# Patient Record
Sex: Female | Born: 1935 | Race: White | Hispanic: No | Smoking: Never smoker
Health system: Southern US, Community
[De-identification: ages and names within clinical notes are randomized; demographics above are authoritative.]

## PROBLEM LIST (undated history)

## (undated) DIAGNOSIS — E039 Hypothyroidism, unspecified: Secondary | ICD-10-CM

## (undated) DIAGNOSIS — E785 Hyperlipidemia, unspecified: Secondary | ICD-10-CM

## (undated) DIAGNOSIS — M1990 Unspecified osteoarthritis, unspecified site: Secondary | ICD-10-CM

## (undated) DIAGNOSIS — I10 Essential (primary) hypertension: Secondary | ICD-10-CM

## (undated) HISTORY — DX: Essential (primary) hypertension: I10

## (undated) HISTORY — DX: Hypothyroidism, unspecified: E03.9

## (undated) HISTORY — PX: TONSILLECTOMY: SHX5217

## (undated) HISTORY — PX: CLOSED REDUCTION SHOULDER DISLOCATION: SUR242

## (undated) HISTORY — DX: Hyperlipidemia, unspecified: E78.5

---
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
## 2000-08-20 ENCOUNTER — Other Ambulatory Visit: Admission: RE | Admit: 2000-08-20 | Discharge: 2000-08-20 | Payer: Self-pay | Admitting: Obstetrics & Gynecology

## 2002-05-07 ENCOUNTER — Encounter: Payer: Self-pay | Admitting: Obstetrics & Gynecology

## 2002-05-07 ENCOUNTER — Ambulatory Visit (HOSPITAL_COMMUNITY): Admission: RE | Admit: 2002-05-07 | Discharge: 2002-05-07 | Payer: Self-pay | Admitting: Obstetrics & Gynecology

## 2002-06-04 ENCOUNTER — Other Ambulatory Visit: Admission: RE | Admit: 2002-06-04 | Discharge: 2002-06-04 | Payer: Self-pay | Admitting: Obstetrics & Gynecology

## 2003-06-04 ENCOUNTER — Encounter: Admission: RE | Admit: 2003-06-04 | Discharge: 2003-06-04 | Payer: Self-pay | Admitting: Family Medicine

## 2003-06-04 ENCOUNTER — Encounter: Payer: Self-pay | Admitting: Family Medicine

## 2003-09-22 ENCOUNTER — Other Ambulatory Visit: Admission: RE | Admit: 2003-09-22 | Discharge: 2003-09-22 | Payer: Self-pay | Admitting: Obstetrics & Gynecology

## 2004-11-19 ENCOUNTER — Ambulatory Visit: Payer: Self-pay | Admitting: Internal Medicine

## 2004-11-19 ENCOUNTER — Inpatient Hospital Stay (HOSPITAL_COMMUNITY): Admission: EM | Admit: 2004-11-19 | Discharge: 2004-11-20 | Payer: Self-pay | Admitting: Emergency Medicine

## 2004-11-21 ENCOUNTER — Ambulatory Visit: Payer: Self-pay | Admitting: Family Medicine

## 2004-12-12 ENCOUNTER — Ambulatory Visit: Payer: Self-pay | Admitting: Family Medicine

## 2005-02-09 ENCOUNTER — Ambulatory Visit: Payer: Self-pay | Admitting: Family Medicine

## 2005-04-11 ENCOUNTER — Ambulatory Visit: Payer: Self-pay | Admitting: Family Medicine

## 2005-04-17 ENCOUNTER — Ambulatory Visit: Payer: Self-pay | Admitting: Family Medicine

## 2005-06-26 ENCOUNTER — Ambulatory Visit: Payer: Self-pay | Admitting: Family Medicine

## 2005-09-26 ENCOUNTER — Other Ambulatory Visit: Admission: RE | Admit: 2005-09-26 | Discharge: 2005-09-26 | Payer: Self-pay | Admitting: Obstetrics & Gynecology

## 2008-01-15 ENCOUNTER — Ambulatory Visit: Payer: Self-pay | Admitting: Family Medicine

## 2008-01-15 DIAGNOSIS — I10 Essential (primary) hypertension: Secondary | ICD-10-CM | POA: Insufficient documentation

## 2008-01-15 DIAGNOSIS — Z9089 Acquired absence of other organs: Secondary | ICD-10-CM | POA: Insufficient documentation

## 2008-01-15 DIAGNOSIS — M810 Age-related osteoporosis without current pathological fracture: Secondary | ICD-10-CM | POA: Insufficient documentation

## 2008-01-15 DIAGNOSIS — M25569 Pain in unspecified knee: Secondary | ICD-10-CM | POA: Insufficient documentation

## 2008-01-21 ENCOUNTER — Telehealth (INDEPENDENT_AMBULATORY_CARE_PROVIDER_SITE_OTHER): Payer: Self-pay | Admitting: *Deleted

## 2008-02-05 ENCOUNTER — Ambulatory Visit: Payer: Self-pay | Admitting: Family Medicine

## 2008-02-06 ENCOUNTER — Encounter (INDEPENDENT_AMBULATORY_CARE_PROVIDER_SITE_OTHER): Payer: Self-pay | Admitting: *Deleted

## 2008-02-19 ENCOUNTER — Ambulatory Visit: Payer: Self-pay | Admitting: Internal Medicine

## 2008-02-19 ENCOUNTER — Encounter: Payer: Self-pay | Admitting: Family Medicine

## 2008-03-03 ENCOUNTER — Ambulatory Visit: Payer: Self-pay | Admitting: Family Medicine

## 2008-03-03 LAB — CONVERTED CEMR LAB
GFR calc Af Amer: 62 mL/min
GFR calc non Af Amer: 51 mL/min
Potassium: 4.1 meq/L (ref 3.5–5.1)
Sodium: 140 meq/L (ref 135–145)

## 2008-03-04 ENCOUNTER — Encounter (INDEPENDENT_AMBULATORY_CARE_PROVIDER_SITE_OTHER): Payer: Self-pay | Admitting: *Deleted

## 2008-03-04 ENCOUNTER — Encounter: Payer: Self-pay | Admitting: Family Medicine

## 2008-06-03 ENCOUNTER — Ambulatory Visit: Payer: Self-pay | Admitting: Family Medicine

## 2008-06-05 LAB — CONVERTED CEMR LAB
AST: 22 units/L (ref 0–37)
Basophils Absolute: 0.1 10*3/uL (ref 0.0–0.1)
Basophils Relative: 0.6 % (ref 0.0–3.0)
Chloride: 106 meq/L (ref 96–112)
Creatinine, Ser: 1.1 mg/dL (ref 0.4–1.2)
Direct LDL: 146.8 mg/dL
Eosinophils Absolute: 0.1 10*3/uL (ref 0.0–0.7)
GFR calc Af Amer: 62 mL/min
GFR calc non Af Amer: 51 mL/min
HDL: 45.4 mg/dL (ref >39.0)
MCHC: 35.4 g/dL (ref 30.0–36.0)
MCV: 90 fL (ref 78.0–100.0)
Neutrophils Relative %: 67.8 % (ref 43.0–77.0)
Platelets: 167 10*3/uL (ref 150–400)
Potassium: 4 meq/L (ref 3.5–5.1)
Total Bilirubin: 1 mg/dL (ref 0.3–1.2)
Triglycerides: 137 mg/dL (ref 0–149)

## 2008-06-07 ENCOUNTER — Encounter (INDEPENDENT_AMBULATORY_CARE_PROVIDER_SITE_OTHER): Payer: Self-pay | Admitting: *Deleted

## 2008-06-30 ENCOUNTER — Telehealth (INDEPENDENT_AMBULATORY_CARE_PROVIDER_SITE_OTHER): Payer: Self-pay | Admitting: *Deleted

## 2008-07-01 ENCOUNTER — Ambulatory Visit: Payer: Self-pay | Admitting: Family Medicine

## 2008-07-01 DIAGNOSIS — R42 Dizziness and giddiness: Secondary | ICD-10-CM | POA: Insufficient documentation

## 2008-07-01 DIAGNOSIS — H6120 Impacted cerumen, unspecified ear: Secondary | ICD-10-CM | POA: Insufficient documentation

## 2008-07-02 ENCOUNTER — Telehealth (INDEPENDENT_AMBULATORY_CARE_PROVIDER_SITE_OTHER): Payer: Self-pay | Admitting: *Deleted

## 2008-07-02 ENCOUNTER — Encounter (INDEPENDENT_AMBULATORY_CARE_PROVIDER_SITE_OTHER): Payer: Self-pay | Admitting: *Deleted

## 2008-07-08 ENCOUNTER — Ambulatory Visit: Payer: Self-pay

## 2008-07-08 ENCOUNTER — Encounter: Payer: Self-pay | Admitting: Family Medicine

## 2008-07-11 DIAGNOSIS — E039 Hypothyroidism, unspecified: Secondary | ICD-10-CM | POA: Insufficient documentation

## 2008-07-12 ENCOUNTER — Telehealth (INDEPENDENT_AMBULATORY_CARE_PROVIDER_SITE_OTHER): Payer: Self-pay | Admitting: *Deleted

## 2008-07-12 ENCOUNTER — Encounter (INDEPENDENT_AMBULATORY_CARE_PROVIDER_SITE_OTHER): Payer: Self-pay | Admitting: *Deleted

## 2008-07-20 ENCOUNTER — Telehealth (INDEPENDENT_AMBULATORY_CARE_PROVIDER_SITE_OTHER): Payer: Self-pay | Admitting: *Deleted

## 2008-07-26 ENCOUNTER — Encounter (INDEPENDENT_AMBULATORY_CARE_PROVIDER_SITE_OTHER): Payer: Self-pay | Admitting: *Deleted

## 2008-09-20 ENCOUNTER — Ambulatory Visit: Payer: Self-pay | Admitting: Family Medicine

## 2008-09-21 ENCOUNTER — Encounter (INDEPENDENT_AMBULATORY_CARE_PROVIDER_SITE_OTHER): Payer: Self-pay | Admitting: *Deleted

## 2008-09-21 LAB — CONVERTED CEMR LAB: Vit D, 1,25-Dihydroxy: 21 — ABNORMAL LOW (ref 30–89)

## 2008-09-23 ENCOUNTER — Encounter: Payer: Self-pay | Admitting: Family Medicine

## 2008-10-08 LAB — CONVERTED CEMR LAB
CO2: 29 meq/L (ref 19–32)
Chloride: 103 meq/L (ref 96–112)
Free T4: 1.2 ng/dL (ref 0.6–1.6)
Glucose, Bld: 94 mg/dL (ref 70–99)
Potassium: 4 meq/L (ref 3.5–5.1)
Sodium: 139 meq/L (ref 135–145)
T3, Free: 2.7 pg/mL (ref 2.3–4.2)
TSH: 1.12 microintl units/mL (ref 0.35–5.50)

## 2008-10-11 ENCOUNTER — Encounter (INDEPENDENT_AMBULATORY_CARE_PROVIDER_SITE_OTHER): Payer: Self-pay | Admitting: *Deleted

## 2008-10-14 ENCOUNTER — Encounter (INDEPENDENT_AMBULATORY_CARE_PROVIDER_SITE_OTHER): Payer: Self-pay | Admitting: *Deleted

## 2008-11-12 HISTORY — PX: CATARACT EXTRACTION W/ INTRAOCULAR LENS  IMPLANT, BILATERAL: SHX1307

## 2009-06-20 ENCOUNTER — Telehealth (INDEPENDENT_AMBULATORY_CARE_PROVIDER_SITE_OTHER): Payer: Self-pay | Admitting: *Deleted

## 2009-09-30 ENCOUNTER — Encounter: Payer: Self-pay | Admitting: Family Medicine

## 2009-11-01 ENCOUNTER — Encounter: Payer: Self-pay | Admitting: Internal Medicine

## 2009-11-01 ENCOUNTER — Ambulatory Visit: Payer: Self-pay | Admitting: Family Medicine

## 2009-11-01 DIAGNOSIS — R319 Hematuria, unspecified: Secondary | ICD-10-CM | POA: Insufficient documentation

## 2009-11-01 DIAGNOSIS — M549 Dorsalgia, unspecified: Secondary | ICD-10-CM | POA: Insufficient documentation

## 2010-10-16 ENCOUNTER — Encounter: Payer: Self-pay | Admitting: Family Medicine

## 2010-10-16 LAB — HM MAMMOGRAPHY: HM Mammogram: NORMAL

## 2010-11-02 ENCOUNTER — Ambulatory Visit: Payer: Self-pay | Admitting: Family Medicine

## 2010-11-02 ENCOUNTER — Encounter: Payer: Self-pay | Admitting: Family Medicine

## 2010-11-02 LAB — CONVERTED CEMR LAB
Bilirubin Urine: NEGATIVE
Ketones, urine, test strip: NEGATIVE
Nitrite: NEGATIVE
Specific Gravity, Urine: 1.01
Urobilinogen, UA: 0.2

## 2010-11-03 ENCOUNTER — Encounter: Payer: Self-pay | Admitting: Family Medicine

## 2010-11-03 LAB — CONVERTED CEMR LAB
Albumin: 3.9 g/dL (ref 3.5–5.2)
Basophils Absolute: 0 10*3/uL (ref 0.0–0.1)
Basophils Relative: 0.5 % (ref 0.0–3.0)
CO2: 28 meq/L (ref 19–32)
Calcium: 9.7 mg/dL (ref 8.4–10.5)
Chloride: 106 meq/L (ref 96–112)
Cholesterol: 224 mg/dL — ABNORMAL HIGH (ref 0–200)
Direct LDL: 152.3 mg/dL
Eosinophils Absolute: 0.1 10*3/uL (ref 0.0–0.7)
Glucose, Bld: 86 mg/dL (ref 70–99)
Hemoglobin: 15.2 g/dL — ABNORMAL HIGH (ref 12.0–15.0)
Lymphocytes Relative: 20.6 % (ref 12.0–46.0)
Lymphs Abs: 1.9 10*3/uL (ref 0.7–4.0)
MCHC: 35 g/dL (ref 30.0–36.0)
MCV: 90.3 fL (ref 78.0–100.0)
Monocytes Absolute: 0.5 10*3/uL (ref 0.1–1.0)
Neutro Abs: 6.6 10*3/uL (ref 1.4–7.7)
RBC: 4.81 M/uL (ref 3.87–5.11)
RDW: 13.3 % (ref 11.5–14.6)
Sodium: 141 meq/L (ref 135–145)
TSH: 11.01 microintl units/mL — ABNORMAL HIGH (ref 0.35–5.50)
Total CHOL/HDL Ratio: 4
Total Protein: 6.8 g/dL (ref 6.0–8.3)
Triglycerides: 88 mg/dL (ref 0.0–149.0)
Vit D, 25-Hydroxy: 34 ng/mL (ref 30–89)

## 2010-11-04 ENCOUNTER — Emergency Department (HOSPITAL_BASED_OUTPATIENT_CLINIC_OR_DEPARTMENT_OTHER)
Admission: EM | Admit: 2010-11-04 | Discharge: 2010-11-04 | Payer: Self-pay | Source: Home / Self Care | Admitting: Emergency Medicine

## 2010-12-01 ENCOUNTER — Other Ambulatory Visit: Payer: Self-pay | Admitting: Family Medicine

## 2010-12-01 ENCOUNTER — Ambulatory Visit
Admission: RE | Admit: 2010-12-01 | Discharge: 2010-12-01 | Payer: Self-pay | Source: Home / Self Care | Attending: Family Medicine | Admitting: Family Medicine

## 2010-12-01 LAB — FECAL OCCULT BLOOD, IMMUNOCHEMICAL: Fecal Occult Bld: NEGATIVE

## 2010-12-10 LAB — CONVERTED CEMR LAB
BUN: 21 mg/dL (ref 6–23)
Basophils Absolute: 0 10*3/uL (ref 0.0–0.1)
Basophils Relative: 0 % (ref 0.0–3.0)
CO2: 27 meq/L (ref 19–32)
Chloride: 106 meq/L (ref 96–112)
Eosinophils Relative: 1.4 % (ref 0.0–5.0)
Glucose, Bld: 94 mg/dL (ref 70–99)
Hemoglobin: 15.4 g/dL — ABNORMAL HIGH (ref 12.0–15.0)
Lymphocytes Relative: 24.7 % (ref 12.0–46.0)
MCHC: 34.4 g/dL (ref 30.0–36.0)
Monocytes Relative: 2.8 % — ABNORMAL LOW (ref 3.0–12.0)
Neutro Abs: 5.9 10*3/uL (ref 1.4–7.7)
Potassium: 3.7 meq/L (ref 3.5–5.1)
RBC: 4.89 M/uL (ref 3.87–5.11)
Sodium: 141 meq/L (ref 135–145)
WBC: 8.3 10*3/uL (ref 4.5–10.5)

## 2010-12-14 NOTE — Letter (Signed)
Summary: [facility]: Immunoassay Fecal Occult Blood (iFOB) Order Form  [facility] [place]  [address] [place], [place] [address]   Phone: [phone]  Fax: [phone]      [facility]: Immunoassay Fecal Occult Blood (iFOB) Order Form   November 02, 2010 MRN: [id number]   [name] [name] May 16, 1927   Physicican Name: Dr.Lowne  Diagnosis Code: V56.71      [name] CMA (AAMA)

## 2010-12-14 NOTE — Assessment & Plan Note (Signed)
Summary: EST MEDICARE/YEARLY//PH   Vital Signs:  Patient profile:   77 year old female Height:      64 inches Weight:      152.4 pounds BMI:     26.25 Pulse rate:   76 / minute Pulse rhythm:   regular BP sitting:   120 / 72  (left arm) Cuff size:   regular  Vitals Entered By: [name] CMA Duncan Dull) (November 02, 2010 10:13 AM) CC: cpx/fasting--wants to discuss BP meds and c/o lbp  Does patient need assistance? Functional Status Self care, Cook/clean, Shopping, Social activities Ambulation Normal Comments pt is able to do all ADLs and can read and write  Vision Screening:      Vision Comments: optho---  digby---q1y 40db HL: Left  Right  Audiometry Comment: grossly normal    History of Present Illness: Pt here for CPE and labs ---pt wondering if bp med can cause back pain.    Preventive Screening-Counseling & Management  Alcohol-Tobacco     Alcohol drinks/day: 0     Smoking Status: never  Caffeine-Diet-Exercise     Caffeine use/day: 2     Does Patient Exercise: no  Hep-HIV-STD-Contraception     Dental Visit-last 6 months yes     Dental Care Counseling: not indicated; dental care within six months     SBE monthly: no     SBE Education/Counseling: to perform regular SBE  Safety-Violence-Falls     Firearms in the Home: firearms in the home     Firearm Counseling: not indicated; uses recommended firearm safety measures     Smoke Detectors: yes     Smoke Detector Counseling: no     Violence in the Home: no risk noted     Sexual Abuse: no     Sexual Abuse Counseling: n/a     Fall Risk: no  Current Medications (verified): 1)  Lisinopril-Hydrochlorothiazide 10-12.5 Mg  Tabs (Lisinopril-Hydrochlorothiazide) .Marland Kitchen.. 1 By Mouth Once Daily 2)  Synthroid 50 Mcg Tabs (Levothyroxine Sodium) .... Take 1 Tab By Mouth Once Daily 3)  Caltrate 600 1500 Mg Tabs (Calcium Carbonate) .... By Mouth Once Daily 4)  Cipro 500 Mg Tabs (Ciprofloxacin Hcl) .Marland Kitchen.. 1 By Mouth Two Times A  Day  Allergies (verified): No Known Drug Allergies  Past History:  Past Medical History: Last updated: 07/11/2008 Hypertension Osteoporosis Hypothyroidism  Past Surgical History: Last updated: 11/01/2009 Tonsillectomy ovarian cyst Cataract extraction (05/2009)-- Right  Family History: Last updated: 02/03/2008 MOTHER:DECEASED FATHER:DECEASED 4 BROTHERS: LIVING 1 BROTHER:DECEASED 1 SISTER:DECEASED CANCER: MOTHER-BLADDER                 FATHER- uNKNOWN (TYPE) Family History Ovarian cancer: SISTER 2 BROTHERS WITH HEART DISEASE  Social History: Last updated: 02/05/2008 Married Never Smoked Alcohol use-no  Risk Factors: Alcohol Use: 0 (11/02/2010) Caffeine Use: 2 (11/02/2010) Exercise: no (11/02/2010)  Risk Factors: Smoking Status: never (11/02/2010)  Family History: Reviewed history from 2008-02-03 and no changes required. MOTHER:DECEASED FATHER:DECEASED 4 BROTHERS: LIVING 1 BROTHER:DECEASED 1 SISTER:DECEASED CANCER: MOTHER-BLADDER                 FATHER- uNKNOWN (TYPE) Family History Ovarian cancer: SISTER 2 BROTHERS WITH HEART DISEASE  Social History: Reviewed history from 02/05/2008 and no changes required. Married Never Smoked Alcohol use-no Fall Risk:  no  Review of Systems      See HPI General:  Denies chills, fatigue, fever, loss of appetite, malaise, sleep disorder, sweats, weakness, and weight loss. Eyes:  Denies blurring, discharge, double vision,  eye irritation, eye pain, halos, itching, light sensitivity, red eye, vision loss-1 eye, and vision loss-both eyes. ENT:  Denies decreased hearing, difficulty swallowing, ear discharge, earache, hoarseness, nasal congestion, nosebleeds, postnasal drainage, ringing in ears, sinus pressure, and sore throat. CV:  Denies bluish discoloration of lips or nails, chest pain or discomfort, difficulty breathing at night, difficulty breathing while lying down, fainting, fatigue, leg cramps with exertion,  lightheadness, near fainting, palpitations, shortness of breath with exertion, swelling of feet, swelling of hands, and weight gain. Resp:  Denies chest discomfort, chest pain with inspiration, cough, coughing up blood, excessive snoring, hypersomnolence, morning headaches, pleuritic, shortness of breath, sputum productive, and wheezing. GI:  Denies abdominal pain, bloody stools, change in bowel habits, constipation, dark tarry stools, diarrhea, excessive appetite, gas, hemorrhoids, indigestion, loss of appetite, nausea, vomiting, vomiting blood, and yellowish skin color. GU:  Denies abnormal vaginal bleeding, decreased libido, discharge, dysuria, genital sores, hematuria, incontinence, nocturia, urinary frequency, and urinary hesitancy. MS:  Complains of low back pain; denies joint pain, joint redness, joint swelling, loss of strength, mid back pain, muscle aches, muscle , cramps, muscle weakness, stiffness, and thoracic pain. Derm:  Denies changes in color of skin, changes in nail beds, dryness, excessive perspiration, flushing, hair loss, insect bite(s), itching, lesion(s), poor wound healing, and rash. Neuro:  Denies brief paralysis, difficulty with concentration, disturbances in coordination, falling down, headaches, inability to speak, memory loss, numbness, poor balance, seizures, sensation of room spinning, tingling, tremors, visual disturbances, and weakness. Psych:  Denies alternate hallucination ( auditory/visual), anxiety, depression, easily angered, easily tearful, irritability, mental problems, panic attacks, sense of great danger, suicidal thoughts/plans, thoughts of violence, unusual visions or sounds, and thoughts /plans of harming others. Endo:  Denies cold intolerance, excessive hunger, excessive thirst, excessive urination, heat intolerance, polyuria, and weight change. Heme:  Denies abnormal bruising, bleeding, enlarge lymph nodes, fevers, pallor, and skin discoloration. Allergy:   Denies hives or rash, itching eyes, persistent infections, seasonal allergies, and sneezing.  Physical Exam  General:  Well-developed,well-nourished,in no acute distress; alert,appropriate and cooperative throughout examination Head:  Normocephalic and atraumatic without obvious abnormalities. No apparent alopecia or balding. Eyes:  pupils equal, pupils round, pupils reactive to light, and no injection.   Ears:  External ear exam shows no significant lesions or deformities.  Otoscopic examination reveals clear canals, tympanic membranes are intact bilaterally without bulging, retraction, inflammation or discharge. Hearing is grossly normal bilaterally. Nose:  External nasal examination shows no deformity or inflammation. Nasal mucosa are pink and moist without lesions or exudates. Mouth:  Oral mucosa and oropharynx without lesions or exudates.  Teeth in good repair. Neck:  No deformities, masses, or tenderness noted.no carotid bruits.   Chest Wall:  No deformities, masses, or tenderness noted. Breasts:  No mass, nodules, thickening, tenderness, bulging, retraction, inflamation, nipple discharge or skin changes noted.   Lungs:  Normal respiratory effort, chest expands symmetrically. Lungs are clear to auscultation, no crackles or wheezes. Heart:  Grade 2  /6 systolic ejection murmur.   Abdomen:  Bowel sounds positive,abdomen soft and non-tender without masses, organomegaly or hernias noted. Msk:  normal ROM, no joint tenderness, no joint swelling, no joint warmth, no redness over joints, no joint deformities, no joint instability, and no crepitation.   Pulses:  R posterior tibial normal, R dorsalis pedis normal, R carotid normal, L posterior tibial normal, L dorsalis pedis normal, and L carotid normal.   Extremities:  No clubbing, cyanosis, edema, or deformity noted with normal full range  of motion of all joints.   Neurologic:  No cranial nerve deficits noted. Station and gait are normal. Plantar  reflexes are down-going bilaterally. DTRs are symmetrical throughout. Sensory, motor and coordinative functions appear intact. Skin:  Intact without suspicious lesions or rashes--mult moles Cervical Nodes:  No lymphadenopathy noted Axillary Nodes:  No palpable lymphadenopathy Psych:  Oriented X3, normally interactive, good eye contact, not anxious appearing, not depressed appearing, not agitated, and not suicidal.     Impression & Recommendations:  Problem # 1:  PREVENTIVE HEALTH CARE (ICD-V70.0)  [facility] utd pt refusing immun , colon and pap. check fasting labs  Orders: Medicare -1st Annual Wellness Visit [phone])  Problem # 2:  HYPOTHYROIDISM (ICD-244.9)  Her updated medication list for this problem includes:    Synthroid 50 Mcg Tabs (Levothyroxine sodium) .Marland Kitchen... Take 1 tab by mouth once daily  Orders: Venipuncture (60454) TLB-Lipid Panel (80061-LIPID) TLB-BMP (Basic Metabolic Panel-BMET) (80048-METABOL) TLB-CBC Platelet - w/Differential (85025-CBCD) TLB-Hepatic/Liver Function Pnl (80076-HEPATIC) TLB-TSH (Thyroid Stimulating Hormone) (84443-TSH) T-Vitamin D (25-Hydroxy) (09811-91478)  Labs Reviewed: TSH: 1.12 (09/20/2008)    Chol: 221 (06/03/2008)   HDL: 45.4 (06/03/2008)   LDL: DEL (06/03/2008)   TG: 137 (06/03/2008)  Problem # 3:  OSTEOPOROSIS (ICD-733.00)  Her updated medication list for this problem includes:    Caltrate 600 1500 Mg Tabs (Calcium carbonate) ..... By mouth once daily  Orders: Venipuncture (29562) TLB-Lipid Panel (80061-LIPID) TLB-BMP (Basic Metabolic Panel-BMET) (80048-METABOL) TLB-CBC Platelet - w/Differential (85025-CBCD) TLB-Hepatic/Liver Function Pnl (80076-HEPATIC) TLB-TSH (Thyroid Stimulating Hormone) (84443-TSH) T-Vitamin D (25-Hydroxy) (13086-57846) Radiology Referral (Radiology)  Bone Density: osteoporosis (02/25/2008) Vit D:21 (09/20/2008)  Problem # 4:  HYPERTENSION (ICD-401.9)  Her updated medication list for this problem includes:     Lisinopril-hydrochlorothiazide 10-12.5 Mg Tabs (Lisinopril-hydrochlorothiazide) .Marland Kitchen... 1 by mouth once daily  Orders: Venipuncture (96295) TLB-Lipid Panel (80061-LIPID) TLB-BMP (Basic Metabolic Panel-BMET) (80048-METABOL) TLB-CBC Platelet - w/Differential (85025-CBCD) TLB-Hepatic/Liver Function Pnl (80076-HEPATIC) TLB-TSH (Thyroid Stimulating Hormone) (84443-TSH) T-Vitamin D (25-Hydroxy) (28413-24401)  BP today: 120/72 Prior BP: 124/80 (11/01/2009)  Labs Reviewed: K+: 4.0 (09/20/2008) Creat: : 1.2 (09/20/2008)   Chol: 221 (06/03/2008)   HDL: 45.4 (06/03/2008)   LDL: DEL (06/03/2008)   TG: 137 (06/03/2008)  Complete Medication List: 1)  Lisinopril-hydrochlorothiazide 10-12.5 Mg Tabs (Lisinopril-hydrochlorothiazide) .Marland Kitchen.. 1 by mouth once daily 2)  Synthroid 50 Mcg Tabs (Levothyroxine sodium) .... Take 1 tab by mouth once daily 3)  Caltrate 600 1500 Mg Tabs (Calcium carbonate) .... By mouth once daily 4)  Cipro 500 Mg Tabs (Ciprofloxacin hcl) .Marland Kitchen.. 1 by mouth two times a day  Other Orders: T-Culture, Urine (02725-36644)  Patient Instructions: 1)  recheck urine 2 weeks 2)  Please schedule a follow-up appointment in 6 months .  Prescriptions: CIPRO 500 MG TABS (CIPROFLOXACIN HCL) 1 by mouth two times a day  #10 x 0   Entered and Authorized by:   Loreen Freud DO   Signed by:   Loreen Freud DO on 11/02/2010   Method used:   Electronically to        [facility] [phone]* (retail)       [address]       [place], [place]  [address]       Ph: [phone] or [phone]       Fax: [phone]   RxID:   [id number] SYNTHROID 50 MCG TABS (LEVOTHYROXINE SODIUM) take 1 tab by mouth once daily  #90 x 3   Entered and Authorized by:  Loreen Freud DO   Signed by:   Loreen Freud DO on 11/02/2010   Method used:   Faxed to ...       [facility])       [address] [address]       [place], [place]  [address]       Ph: [phone]       Fax: [phone]    RxID:   [id number] LISINOPRIL-HYDROCHLOROTHIAZIDE 10-12.5 MG  TABS (LISINOPRIL-HYDROCHLOROTHIAZIDE) 1 by mouth once daily  #90 x 3   Entered and Authorized by:   Loreen Freud DO   Signed by:   Loreen Freud DO on 11/02/2010   Method used:   Faxed to ...       [facility])       [address] [address]       [place], [place]  [address]       Ph: [phone]       Fax: [phone]   RxID:   [id number]    Orders Added: 1)  Venipuncture [88416] 2)  TLB-Lipid Panel [80061-LIPID] 3)  TLB-BMP (Basic Metabolic Panel-BMET) [80048-METABOL] 4)  TLB-CBC Platelet - w/Differential [85025-CBCD] 5)  TLB-Hepatic/Liver Function Pnl [80076-HEPATIC] 6)  TLB-TSH (Thyroid Stimulating Hormone) [84443-TSH] 7)  T-Vitamin D (25-Hydroxy) [60630-16010] 8)  Radiology Referral [Radiology] 9)  T-Culture, Urine [93235-57322] 10)  Medicare -1st Annual Wellness Visit [G0438] 11)  Est. Patient Level III [02542]    Laboratory Results   Urine Tests    Routine Urinalysis   Color: yellow Appearance: Clear Glucose: negative   (Normal Range: Negative) Bilirubin: negative   (Normal Range: Negative) Ketone: negative   (Normal Range: Negative) Spec. Gravity: 1.010   (Normal Range: 1.003-1.035) Blood: large   (Normal Range: Negative) pH: 5.0   (Normal Range: 5.0-8.0) Protein: 30   (Normal Range: Negative) Urobilinogen: 0.2   (Normal Range: 0-1) Nitrite: negative   (Normal Range: Negative) Leukocyte Esterace: large   (Normal Range: Negative)       Flu Vaccine Next Due:  Refused Pneumovax Next Due:  Refused Herpes Zoster Next Due:  Refused Colonoscopy Next Due:  Refused PAP Next Due:  Refused Last Mammogram:  normal (09/30/2009 2:38:21 PM) Mammogram Result Date:  10/16/2010 Mammogram Result:  normal Mammogram Next Due:  1 yr

## 2011-01-29 ENCOUNTER — Other Ambulatory Visit (INDEPENDENT_AMBULATORY_CARE_PROVIDER_SITE_OTHER): Payer: Medicare Other

## 2011-01-29 ENCOUNTER — Other Ambulatory Visit: Payer: Self-pay | Admitting: Family Medicine

## 2011-01-29 ENCOUNTER — Encounter: Payer: Self-pay | Admitting: *Deleted

## 2011-01-29 DIAGNOSIS — E039 Hypothyroidism, unspecified: Secondary | ICD-10-CM

## 2011-01-29 DIAGNOSIS — E785 Hyperlipidemia, unspecified: Secondary | ICD-10-CM

## 2011-01-29 LAB — HEPATIC FUNCTION PANEL
ALT: 20 U/L (ref 0–35)
AST: 18 U/L (ref 0–37)
Albumin: 4.1 g/dL (ref 3.5–5.2)
Alkaline Phosphatase: 68 U/L (ref 39–117)
Bilirubin, Direct: 0.1 mg/dL (ref 0.0–0.3)
Total Protein: 6.8 g/dL (ref 6.0–8.3)

## 2011-01-29 LAB — LIPID PANEL
Cholesterol: 244 mg/dL — ABNORMAL HIGH (ref 0–200)
VLDL: 26.2 mg/dL (ref 0.0–40.0)

## 2011-03-30 NOTE — Discharge Summary (Signed)
NAME:  [name], [name]                ACCOUNT NO.:  [id number]   MEDICAL RECORD NO.:  [id number]          PATIENT TYPE:  INP   LOCATION:  3029                         FACILITY:  [facility]   PHYSICIAN:  Rene Paci, M.D. LHCDATE OF BIRTH:  1927/02/12   DATE OF ADMISSION:  11/19/2004  DATE OF DISCHARGE:  11/20/2004                                 DISCHARGE SUMMARY   DISCHARGE DIAGNOSES:  1.  Transient ischemic attack.  2.  Hypertension.   BRIEF ADMISSION HISTORY:  Ms. [name] is a 77 year old female who presented  with a 10-minute history of waves in front of her eyes.  Shortly  thereafter she had a numb sensation and inability to walk for five minutes.   PAST MEDICAL HISTORY:  1.  Osteoporosis.  2.  History of removal of an ovarian cyst.   HOSPITAL COURSE:  #1 - NEUROLOGIC:  The patient was admitted to rule out  TIA.  Patient had head CT, MRI and MRA brain that were negative.  Carotid  Dopplers were ordered, but were still pending at the time of this dictation.  No further neurologic work-up was needed.   #2 - HYPERTENSION:  This was a new diagnosis.  The patient was started on an  ACE inhibitor with improvement in her blood pressure.   DISCHARGE LABORATORIES:  Hemoglobin 14.4.  Coags were normal.  BUN 16,  creatinine 0.9.  LFTs were normal.  Cholesterol 203, triglycerides 111, HDL  59, LDL 122.   MEDICATIONS:  1.  Altace 5 mg b.i.d.  2.  Aspirin 325 mg daily.  3.  Fosamax as at home.   FOLLOW-UP:  Follow up with Dr. Laury Axon.      LC/MEDQ  D:  01/24/2005  T:  01/24/2005  Job:  [id number]   cc:   Loreen Freud, M.D.

## 2011-03-30 NOTE — H&P (Signed)
NAME:  [name] [name]                ACCOUNT NO.:  [id number]   MEDICAL RECORD NO.:  [id number]          PATIENT TYPE:  EMS   LOCATION:  [facility]                         FACILITY:  [facility]   PHYSICIAN:  Valetta Mole. Swords, M.D. [facility] OF BIRTH:  Sep 28, 1927   DATE OF ADMISSION:  11/19/2004  DATE OF DISCHARGE:                                HISTORY & PHYSICAL   CHIEF COMPLAINT:  Eyes blurry.   HISTORY OF PRESENT ILLNESS:  [name] [name] is a 77 year old female who has  enjoyed good health.  This morning when she woke up, she had 10 minutes what  she describes as a wavy sensation in front of her eyes.  No real blurriness.  Occasionally things would be out of focus as if looking through water.  She  denies any headache or any other neurologic deficits at that time.  Shortly,  thereafter while cooking breakfast, she had a numb sensation in her left arm  and left leg and actually had a period where she had difficulty moving her  left leg.  This lasted for approximately five minutes, resolved and then  approximately 10-15 minutes later, the symptoms recurred (not as profound).  During all of this, she had no other neurologic symptoms.  No headache and  no other visual disturbance.  She has not had any recent head trauma.  There  has not been any other significant changes in her life.  She has not had any  unusual stressors.  No other associated symptoms.  No other modifying  factors noted.   PAST MEDICAL HISTORY:  Significant for osteoporosis.   MEDICATIONS:  Fosamax 70 mg weekly.   PAST SURGICAL HISTORY:  She had surgery on an ovarian cyst in her 85's.   FAMILY HISTORY:  No evidence of strokes.   SOCIAL HISTORY:  Nonsmoker, nondrinker.  She lives with her husband.   REVIEW OF SYSTEMS:  She denies any chest pain, shortness of breath, PND,  orthopnea, abdominal pain, change in bowel movements, change in urinary  habits, rashes, musculoskeletal complaints or any other complaints in the  review of  systems other than those listed above.   PHYSICAL EXAMINATION:  VITAL SIGNS: Blood pressure 188/80, heart rate 80,  temperature 98, respirations 14.  GENERAL:  She appears as a well-developed, well-nourished female in no acute  distress.  She appears younger than her stated age.  HEENT: Atraumatic, normocephalic. Extraocular muscles were intact. .  Pupils  are equally round and reactive to light.  NECK:  Supple without lymphadenopathy, thyromegaly, jugular venous  distension, or carotid bruits.  Cranial nerves II-XII intact.  CHEST:  Clear to auscultation without any increased work of breathing.  CARDIAC:  S1 and S2 are normal without murmurs or gallops.  PMI is normal.  ABDOMEN:  Active bowel sounds, soft, nontender.  There is no  hepatosplenomegaly. No masses are palpated.  EXTREMITIES:  No clubbing, cyanosis or edema.Marland Kitchen  NEUROLOGIC:  She is alert and oriented without any motor or sensory  deficits.  Motor strength is equal and normal in all extremities.   LABORATORY DATA:  Initial cardiac enzymes  normal.  Urinalysis microscopy  significant for 3-6 white blood cells per high-power field and 3-6 red blood  cells per high-power field.  CMET is normal except for a glucose of 101.  CBC is normal except for a white count of 11.1, hemoglobin 16.3.   CT scan of the head normal on verbal report.   ASSESSMENT/PLAN:  1.  Neurologic event, transient in nature.  I suspect she had a transient      ischemic attack  I guess this could a migraine variant but she does not      have that history.  She needs aggressive treatment including      anticoagulation with heparin, aspirin.  Will obtain an MRI of her head      and include an MRA of her head.  Will obtain a carotid ultrasound.  2.  Hypertension.  Could be a contributor.  Will start an ACE inhibitor.   Will check a lipid panel in the morning.      BHS/MEDQ  D:  11/19/2004  T:  11/19/2004  Job:  [id number]   cc:   Loreen Freud, M.D.

## 2011-04-28 ENCOUNTER — Encounter: Payer: Self-pay | Admitting: Family Medicine

## 2011-05-07 ENCOUNTER — Ambulatory Visit: Payer: Medicare Other | Admitting: Family Medicine

## 2011-05-08 ENCOUNTER — Ambulatory Visit (INDEPENDENT_AMBULATORY_CARE_PROVIDER_SITE_OTHER): Payer: Medicare Other | Admitting: Family Medicine

## 2011-05-08 ENCOUNTER — Encounter: Payer: Self-pay | Admitting: Family Medicine

## 2011-05-08 DIAGNOSIS — I10 Essential (primary) hypertension: Secondary | ICD-10-CM

## 2011-05-08 DIAGNOSIS — E039 Hypothyroidism, unspecified: Secondary | ICD-10-CM

## 2011-05-08 DIAGNOSIS — N390 Urinary tract infection, site not specified: Secondary | ICD-10-CM

## 2011-05-08 DIAGNOSIS — E785 Hyperlipidemia, unspecified: Secondary | ICD-10-CM

## 2011-05-08 LAB — BASIC METABOLIC PANEL
BUN: 22 mg/dL (ref 6–23)
CO2: 29 mEq/L (ref 19–32)
Calcium: 9.5 mg/dL (ref 8.4–10.5)
Chloride: 108 mEq/L (ref 96–112)
Creatinine, Ser: 1.1 mg/dL (ref 0.4–1.2)
GFR: 52.69 mL/min — ABNORMAL LOW (ref >60.00)
Glucose, Bld: 92 mg/dL (ref 70–99)
Potassium: 4.6 mEq/L (ref 3.5–5.1)
Sodium: 144 mEq/L (ref 135–145)

## 2011-05-08 LAB — POCT URINALYSIS DIPSTICK
Bilirubin, UA: NEGATIVE
Glucose, UA: NEGATIVE
Ketones, UA: NEGATIVE
Nitrite, UA: NEGATIVE
Protein, UA: NEGATIVE
Spec Grav, UA: 1.005
Urobilinogen, UA: 0.2
pH, UA: 7.5

## 2011-05-08 LAB — HEPATIC FUNCTION PANEL
ALT: 17 U/L (ref 0–35)
AST: 20 U/L (ref 0–37)
Albumin: 4.2 g/dL (ref 3.5–5.2)
Alkaline Phosphatase: 66 U/L (ref 39–117)
Bilirubin, Direct: 0.1 mg/dL (ref 0.0–0.3)
Total Bilirubin: 0.5 mg/dL (ref 0.3–1.2)
Total Protein: 6.9 g/dL (ref 6.0–8.3)

## 2011-05-08 LAB — LIPID PANEL
Cholesterol: 178 mg/dL (ref 0–200)
LDL Cholesterol: 98 mg/dL (ref 0–99)
Total CHOL/HDL Ratio: 3

## 2011-05-08 LAB — TSH: TSH: 4.24 u[IU]/mL (ref 0.35–5.50)

## 2011-05-08 NOTE — Assessment & Plan Note (Signed)
con't meds c  Check labs rto 6 months

## 2011-05-08 NOTE — Patient Instructions (Signed)

## 2011-05-08 NOTE — Progress Notes (Signed)
  Subjective:    [name] [name] is a 77 y.o. female here for follow up of dyslipidemia. The patient does not use medications that may worsen dyslipidemias (corticosteroids, progestins, anabolic steroids, diuretics, beta-blockers, amiodarone, cyclosporine, olanzapine). The patient exercises occasionally. The patient is not known to have coexisting coronary artery disease.   Cardiac Risk Factors Age > 45-female, > 55-female:  YES  +1  Smoking:   NO  Sig. family hx of CHD*:  Yes  Hypertension:   YES  +1  Diabetes:   NO  HDL < 35:   NO  HDL > 59:   YES  -1  Total: 1   *- Sig. family h/o CHD per NCEP = MI or sudden death at <55yo in  father or other 1st-degree female relative, or <65yo in mother or  other 1st-degree female relative  The following portions of the patient's history were reviewed and updated as appropriate: allergies, current medications, past family history, past medical history, past social history, past surgical history and problem list.  Review of Systems Pertinent items are noted in HPI.    Objective:    BP 132/70  Pulse 73  Temp(Src) 98.7 F (37.1 C) (Oral)  Wt 150 lb 9.6 oz (68.312 kg)  SpO2 98% General appearance: alert, cooperative, appears stated age and no distress Neck: no adenopathy, no carotid bruit, no JVD, supple, symmetrical, trachea midline and thyroid not enlarged, symmetric, no tenderness/mass/nodules Lungs: clear to auscultation bilaterally Heart: regular rate and rhythm, S1, S2 normal, no murmur, click, rub or gallop Extremities: extremities normal, atraumatic, no cyanosis or edema Psych AAOx3  Lab Review Lab Results  Component Value Date   CHOL 244* 01/29/2011   CHOL 224* 11/02/2010   CHOL 221* 06/03/2008   HDL 64.10 01/29/2011   HDL 16.10 11/02/2010   HDL 45.4 06/03/2008   LDLDIRECT 157.4 01/29/2011   LDLDIRECT 152.3 11/02/2010   LDLDIRECT 146.8 06/03/2008      Assessment:    Dyslipidemia as detailed above with 1 CHD risk factors using  NCEP scheme above.  Target levels for LDL are: < 100 mg/dl (CHD or "CHD risk equivalent" is present)  Explained to the patient the respective contributions of genetics, diet, and exercise to lipid levels and the use of medication in severe cases which do not respond to lifestyle alteration. The patient's interest and motivation in making lifestyle changes seems good.    Plan:    The following changes are planned for the next 6 months, at which time the patient will return for repeat fasting lipids:  1. Dietary changes: Increase soluble fiber Plant sterols 2grams per day (e.g. Benecol): yes Reduce saturated fat, "trans" monounsaturated fatty acids, and cholesterol 2. Exercise changes:  increase exercise 3. Other treatment: None 4. Lipid-lowering medications: zocor 10 mg  (Recommended by NCEP after 3-6 mos of dietary therapy & lifestyle modification,  except if CHD is present or LDL well above 190.) 5. Hormone replacement therapy (patient is a postmenopausal  woman): no 6. Screening for secondary causes of dyslipidemias: None indicated 7. Lipid screening for relatives: na 8. Follow up: 6 months.  Note: The majority of the visit was spent in counseling on the pathophysiology and treatment of dyslipidemias. The total face-to-face time was in excess of 20 minutes.

## 2011-05-08 NOTE — Assessment & Plan Note (Signed)
Check labs con't meds 

## 2011-05-10 LAB — URINE CULTURE: Colony Count: NO GROWTH

## 2011-09-19 ENCOUNTER — Other Ambulatory Visit: Payer: Self-pay | Admitting: Family Medicine

## 2011-09-19 MED ORDER — SIMVASTATIN 20 MG PO TABS: 10.0000 mg | ORAL_TABLET | Freq: Every day | ORAL | Status: DC

## 2011-09-19 NOTE — Telephone Encounter (Signed)
Labs current--Rx sent to the pharmacy    KP

## 2011-10-26 ENCOUNTER — Encounter: Payer: Self-pay | Admitting: Family Medicine

## 2011-11-20 ENCOUNTER — Telehealth: Payer: Self-pay | Admitting: Family Medicine

## 2011-11-20 MED ORDER — LISINOPRIL-HYDROCHLOROTHIAZIDE 10-12.5 MG PO TABS: 1.0000 | ORAL_TABLET | Freq: Every day | ORAL | Status: DC

## 2011-11-20 MED ORDER — SIMVASTATIN 20 MG PO TABS: 10.0000 mg | ORAL_TABLET | Freq: Every day | ORAL | Status: DC

## 2011-11-20 MED ORDER — LEVOTHYROXINE SODIUM 75 MCG PO TABS: 75.0000 ug | ORAL_TABLET | Freq: Every day | ORAL | Status: DC

## 2011-11-20 NOTE — Telephone Encounter (Signed)
Letter mailed to schedule a lab apt.     KP 

## 2011-11-20 NOTE — Telephone Encounter (Signed)
Patient states that she needs refills on lisinopril [facility]

## 2011-12-13 ENCOUNTER — Other Ambulatory Visit: Payer: Self-pay | Admitting: Family Medicine

## 2011-12-13 DIAGNOSIS — E785 Hyperlipidemia, unspecified: Secondary | ICD-10-CM

## 2011-12-14 ENCOUNTER — Other Ambulatory Visit (INDEPENDENT_AMBULATORY_CARE_PROVIDER_SITE_OTHER): Payer: Medicare Other

## 2011-12-14 DIAGNOSIS — E785 Hyperlipidemia, unspecified: Secondary | ICD-10-CM

## 2011-12-14 LAB — HEPATIC FUNCTION PANEL
AST: 18 U/L (ref 0–37)
Alkaline Phosphatase: 65 U/L (ref 39–117)
Bilirubin, Direct: 0 mg/dL (ref 0.0–0.3)
Total Bilirubin: 0.6 mg/dL (ref 0.3–1.2)

## 2011-12-14 LAB — BASIC METABOLIC PANEL
BUN: 21 mg/dL (ref 6–23)
CO2: 28 mEq/L (ref 19–32)
Calcium: 9.4 mg/dL (ref 8.4–10.5)
Creatinine, Ser: 0.9 mg/dL (ref 0.4–1.2)
Glucose, Bld: 96 mg/dL (ref 70–99)

## 2011-12-14 LAB — LIPID PANEL: Total CHOL/HDL Ratio: 3

## 2012-06-03 ENCOUNTER — Other Ambulatory Visit: Payer: Self-pay | Admitting: Family Medicine

## 2012-06-19 ENCOUNTER — Encounter (HOSPITAL_BASED_OUTPATIENT_CLINIC_OR_DEPARTMENT_OTHER): Payer: Self-pay

## 2012-06-19 ENCOUNTER — Emergency Department (HOSPITAL_BASED_OUTPATIENT_CLINIC_OR_DEPARTMENT_OTHER): Payer: Medicare Other

## 2012-06-19 ENCOUNTER — Emergency Department (HOSPITAL_BASED_OUTPATIENT_CLINIC_OR_DEPARTMENT_OTHER)
Admission: EM | Admit: 2012-06-19 | Discharge: 2012-06-19 | Disposition: A | Discharge status: Home / Self Care | Payer: Medicare Other | Attending: Emergency Medicine | Admitting: Emergency Medicine

## 2012-06-19 DIAGNOSIS — M810 Age-related osteoporosis without current pathological fracture: Secondary | ICD-10-CM | POA: Insufficient documentation

## 2012-06-19 DIAGNOSIS — I10 Essential (primary) hypertension: Secondary | ICD-10-CM | POA: Insufficient documentation

## 2012-06-19 DIAGNOSIS — E039 Hypothyroidism, unspecified: Secondary | ICD-10-CM | POA: Insufficient documentation

## 2012-06-19 DIAGNOSIS — M25469 Effusion, unspecified knee: Secondary | ICD-10-CM | POA: Insufficient documentation

## 2012-06-19 DIAGNOSIS — E785 Hyperlipidemia, unspecified: Secondary | ICD-10-CM | POA: Insufficient documentation

## 2012-06-19 DIAGNOSIS — S8391XA Sprain of unspecified site of right knee, initial encounter: Secondary | ICD-10-CM

## 2012-06-19 NOTE — ED Notes (Signed)
C/o right knee pain/swelling-?twisted knee yesterday

## 2012-06-19 NOTE — ED Provider Notes (Signed)
History     CSN: [id number]  Arrival date & time 06/19/12  1725   First MD Initiated Contact with Patient 06/19/12 1740      Chief Complaint  Patient presents with  . Knee Pain    (Consider location/radiation/quality/duration/timing/severity/associated sxs/prior treatment) Patient is a 78 y.o. female presenting with knee pain. The history is provided by the patient.  Knee Pain This is a new problem. The current episode started yesterday. The problem occurs constantly. The problem has been gradually worsening. Associated symptoms comments: Pain, swelling of the right knee.  Thinks she may have twisted it 2 nights ago while walking in the wet grass. The symptoms are aggravated by walking, bending and standing. The symptoms are relieved by ice and relaxation. She has tried rest (ice) for the symptoms. The treatment provided mild relief.    Past Medical History  Diagnosis Date  . Hypertension   . Osteoporosis   . Hypothyroidism   . Hyperlipidemia     Past Surgical History  Procedure Date  . Tonsillectomy   . Ovarian cyst surgery   . Cataract extraction 05-2009    Right  . Eye surgery 2010    dr Hazle Quant-- R eye    Family History  Problem Relation Age of Onset  . Cancer Mother     bladder  . Cancer Father     unknown  . Ovarian cancer Sister   . Heart disease Brother     2 brothers    History  Substance Use Topics  . Smoking status: Never Smoker   . Smokeless tobacco: Not on file  . Alcohol Use: No    OB History    Grav Para Term Preterm Abortions TAB SAB Ect Mult Living                  Review of Systems  All other systems reviewed and are negative.    Allergies  Review of patient's allergies indicates no known allergies.  Home Medications   Current Outpatient Rx  Name Route Sig Dispense Refill  . CALCIUM CARBONATE 1500 MG PO TABS Oral Take by mouth daily.      Marland Kitchen LEVOTHYROXINE SODIUM 75 MCG PO TABS  TAKE 1 TABLET BY MOUTH DAILY 90 tablet 0  .  LISINOPRIL-HYDROCHLOROTHIAZIDE 10-12.5 MG PO TABS  TAKE 1 TABLET BY MOUTH DAILY 90 tablet 0  . SIMVASTATIN 20 MG PO TABS  TAKE 1/2 TABLET BY MOUTH AT BEDTIME 45 tablet 0    BP 153/68  Pulse 92  Temp 98.2 F (36.8 C) (Oral)  Resp 20  Ht 5\' 3"  (1.6 m)  Wt 150 lb (68.04 kg)  BMI 26.57 kg/m2  SpO2 100%  Physical Exam  Nursing note and vitals reviewed. Constitutional: She is oriented to person, place, and time. She appears well-developed and well-nourished. No distress.  HENT:  Head: Normocephalic and atraumatic.  Eyes: EOM are normal. Pupils are equal, round, and reactive to light.  Musculoskeletal:       Right knee: She exhibits swelling and effusion. She exhibits normal range of motion, no deformity and no erythema. tenderness found. Patellar tendon tenderness noted. No medial joint line and no lateral joint line tenderness noted.       Legs: Neurological: She is alert and oriented to person, place, and time.  Skin: Skin is warm and dry. No erythema.    ED Course  Procedures (including critical care time)  Labs Reviewed - No data to display Dg Knee Complete 4 Views  Right  06/19/2012  *RADIOLOGY REPORT*  Clinical Data: Fall.  Knee injury.  Knee pain and swelling.  RIGHT KNEE - COMPLETE 4+ VIEW  Comparison: 01/15/2008  Findings: No evidence of fracture or dislocation.  Small knee joint effusion noted.  Chondrocalcinosis also demonstrated.  No evidence of joint space narrowing or sclerosis.  No other bone abnormality identified.  IMPRESSION:  1.  No evidence of fracture. 2.  Small knee joint effusion and chondrocalcinosis. CPPD arthropathy cannot be excluded.  Original Report Authenticated By: Danae Orleans, M.D.     No diagnosis found.    MDM   Patient with right knee pain and swelling for the last day and a half after twisting her knee possibly while she was walking in to the house 2 nights ago. There no signs of septic joint and patient is able to bend her knee to 80. There is  suprapatellar swelling but no joint line tenderness. No history of knee replacement for other new surgeries.  Plain films pending.  6:36 PM Pt with no evidence of fx but knee effusion and arthropathy.  Will have f/u with alusio of pain continues and use ice and ibuprofen.        Gwyneth Sprout, MD 06/19/12 [phone]

## 2012-07-28 ENCOUNTER — Other Ambulatory Visit: Payer: Self-pay | Admitting: Family Medicine

## 2012-07-28 NOTE — Telephone Encounter (Signed)
PT wanted to come the same day as spouse appts are 12.3.13 at 930 and 1030

## 2012-07-28 NOTE — Telephone Encounter (Signed)
Please offer this patient a CPE.      KP 

## 2012-08-06 NOTE — Telephone Encounter (Signed)
Call from the patient and she stated that she received another Rx from [facility] and it was too soon, she said they call her and she asked them not to send it but they sent it anyway. I made her aware the request came in via the the electronic system so we sent it because we assumed she needed it. She wanted [place] to document that in the future we should call her to confirm because the Mail order is requesting her medication too soon, I made her aware that I would document her chart.     KP

## 2012-09-22 ENCOUNTER — Other Ambulatory Visit: Payer: Self-pay | Admitting: Family Medicine

## 2012-09-24 ENCOUNTER — Encounter: Payer: Medicare Other | Admitting: Family Medicine

## 2012-10-10 ENCOUNTER — Ambulatory Visit (INDEPENDENT_AMBULATORY_CARE_PROVIDER_SITE_OTHER): Payer: Medicare Other | Admitting: Family Medicine

## 2012-10-10 ENCOUNTER — Encounter: Payer: Self-pay | Admitting: Family Medicine

## 2012-10-10 VITALS — BP 134/70 | HR 80 | Temp 97.9°F | Ht 63.0 in | Wt 148.8 lb

## 2012-10-10 DIAGNOSIS — M810 Age-related osteoporosis without current pathological fracture: Secondary | ICD-10-CM

## 2012-10-10 DIAGNOSIS — Z0000 Encounter for general adult medical examination without abnormal findings: Secondary | ICD-10-CM

## 2012-10-10 DIAGNOSIS — Z780 Asymptomatic menopausal state: Secondary | ICD-10-CM

## 2012-10-10 DIAGNOSIS — H6120 Impacted cerumen, unspecified ear: Secondary | ICD-10-CM

## 2012-10-10 DIAGNOSIS — E039 Hypothyroidism, unspecified: Secondary | ICD-10-CM

## 2012-10-10 DIAGNOSIS — E785 Hyperlipidemia, unspecified: Secondary | ICD-10-CM

## 2012-10-10 DIAGNOSIS — R319 Hematuria, unspecified: Secondary | ICD-10-CM

## 2012-10-10 DIAGNOSIS — I10 Essential (primary) hypertension: Secondary | ICD-10-CM

## 2012-10-10 LAB — HEPATIC FUNCTION PANEL
AST: 25 U/L (ref 0–37)
Alkaline Phosphatase: 63 U/L (ref 39–117)
Bilirubin, Direct: 0.1 mg/dL (ref 0.0–0.3)
Total Bilirubin: 1 mg/dL (ref 0.3–1.2)

## 2012-10-10 LAB — POCT URINALYSIS DIPSTICK
Leukocytes, UA: NEGATIVE
Protein, UA: NEGATIVE
Spec Grav, UA: 1.02
Urobilinogen, UA: 0.2

## 2012-10-10 LAB — CBC WITH DIFFERENTIAL/PLATELET
Basophils Absolute: 0 10*3/uL (ref 0.0–0.1)
Lymphocytes Relative: 16 % (ref 12.0–46.0)
Monocytes Relative: 7.1 % (ref 3.0–12.0)
Platelets: 169 10*3/uL (ref 150.0–400.0)
RDW: 13.5 % (ref 11.5–14.6)
WBC: 10.3 10*3/uL (ref 4.5–10.5)

## 2012-10-10 LAB — TSH: TSH: 2.24 u[IU]/mL (ref 0.35–5.50)

## 2012-10-10 LAB — LIPID PANEL
LDL Cholesterol: 82 mg/dL (ref 0–99)
Total CHOL/HDL Ratio: 3
VLDL: 14.8 mg/dL (ref 0.0–40.0)

## 2012-10-10 LAB — BASIC METABOLIC PANEL
BUN: 25 mg/dL — ABNORMAL HIGH (ref 6–23)
Calcium: 9.5 mg/dL (ref 8.4–10.5)
GFR: 53.08 mL/min — ABNORMAL LOW (ref >60.00)
Glucose, Bld: 102 mg/dL — ABNORMAL HIGH (ref 70–99)
Sodium: 140 mEq/L (ref 135–145)

## 2012-10-10 NOTE — Addendum Note (Signed)
Addended by: Silvio Pate D on: 10/10/2012 01:55 PM   Modules accepted: Orders

## 2012-10-10 NOTE — Progress Notes (Signed)
Subjective:    [name] ZOLMAN is a 78 y.o. female who presents for Medicare Annual/Subsequent preventive examination.  Preventive Screening-Counseling & Management  Tobacco History  Smoking status  . Never Smoker   Smokeless tobacco  . Not on file     Problems Prior to Visit 1. none  Current Problems (verified) Patient Active Problem List  Diagnosis  . HYPOTHYROIDISM  . CERUMEN IMPACTION, BILATERAL  . HYPERTENSION  . HEMATURIA UNSPECIFIED  . KNEE PAIN, RIGHT  . BACK PAIN  . OSTEOPOROSIS  . DIZZINESS  . TONSILLECTOMY AND ADENOIDECTOMY, HX OF    Medications Prior to Visit Current Outpatient Prescriptions on File Prior to Visit  Medication Sig Dispense Refill  . Calcium Carbonate (CALTRATE 600) 1500 MG TABS Take by mouth daily.        Marland Kitchen levothyroxine (SYNTHROID, LEVOTHROID) 75 MCG tablet TAKE 1 TABLET DAILY  90 tablet  0  . lisinopril-hydrochlorothiazide (PRINZIDE,ZESTORETIC) 10-12.5 MG per tablet TAKE 1 TABLET DAILY  90 tablet  0  . Multiple Vitamins-Minerals (EYE VITAMINS PO) Take 1 tablet by mouth daily.      . simvastatin (ZOCOR) 20 MG tablet TAKE ONE-HALF (1/2) TABLET AT BEDTIME, OFFICE VISIT DUE NOW  45 tablet  0    Current Medications (verified) Current Outpatient Prescriptions  Medication Sig Dispense Refill  . Calcium Carbonate (CALTRATE 600) 1500 MG TABS Take by mouth daily.        Marland Kitchen levothyroxine (SYNTHROID, LEVOTHROID) 75 MCG tablet TAKE 1 TABLET DAILY  90 tablet  0  . lisinopril-hydrochlorothiazide (PRINZIDE,ZESTORETIC) 10-12.5 MG per tablet TAKE 1 TABLET DAILY  90 tablet  0  . Multiple Vitamins-Minerals (EYE VITAMINS PO) Take 1 tablet by mouth daily.      . simvastatin (ZOCOR) 20 MG tablet TAKE ONE-HALF (1/2) TABLET AT BEDTIME, OFFICE VISIT DUE NOW  45 tablet  0     Allergies (verified) Review of patient's allergies indicates no known allergies.   PAST HISTORY  Family History Family History  Problem Relation Age of Onset  . Cancer Mother    bladder  . Cancer Father     unknown  . Ovarian cancer Sister   . Heart disease Brother     2 brothers    Social History History  Substance Use Topics  . Smoking status: Never Smoker   . Smokeless tobacco: Not on file  . Alcohol Use: No     Are there smokers in your home (other than you)? No  Risk Factors Current exercise habits: walking  Dietary issues discussed: na   Cardiac risk factors: advanced age (older than 77 for men, 20 for women) and dyslipidemia.  Depression Screen (Note: if answer to either of the following is "Yes", a more complete depression screening is indicated)   Over the past two weeks, have you felt down, depressed or hopeless? No  Over the past two weeks, have you felt little interest or pleasure in doing things? No  Have you lost interest or pleasure in daily life? No  Do you often feel hopeless? No  Do you cry easily over simple problems? No  Activities of Daily Living In your present state of health, do you have any difficulty performing the following activities?:  Driving? No Managing money?  No Feeding yourself? No Getting from bed to chair? No Climbing a flight of stairs? No Preparing food and eating?: No Bathing or showering? No Getting dressed: No Getting to the toilet? No Using the toilet:No Moving around from place to place:  No In the past year have you fallen or had a near fall?:No   Are you sexually active?  No  Do you have more than one partner?  No  Hearing Difficulties: Yes Do you often ask people to speak up or repeat themselves? Yes Do you experience ringing or noises in your ears? No Do you have difficulty understanding soft or whispered voices? Yes   Do you feel that you have a problem with memory? No  Do you often misplace items? No  Do you feel safe at home?  Yes  Cognitive Testing  Alert? Yes  Normal Appearance?Yes  Oriented to person? Yes  Place? Yes   Time? Yes  Recall of three objects?  Yes  Can perform  simple calculations? Yes  Displays appropriate judgment?Yes  Can read the correct time from a watch face?Yes   Advanced Directives have been discussed with the patient? Yes  List the Names of Other Physician/Practitioners you currently use: 1.  opth--[place] 2  Dentist-- [facility]  Indicate any recent Medical Services you may have received from other than [facility] providers in the past year (date may be approximate).   There is no immunization history on file for this patient.  Screening Tests Health Maintenance  Topic Date Due  . Tetanus/tdap  11/15/1945  . Zostavax  11/14/1988  . Pneumococcal Polysaccharide Vaccine Age 53 And Over  11/14/1993  . Influenza Vaccine  07/13/2012  . Mammogram  10/21/2012  . Colonoscopy  11/02/2019    All answers were reviewed with the patient and necessary referrals were made:  Loreen Freud, DO   10/10/2012   History reviewed:  She  has a past medical history of Hypertension; Osteoporosis; Hypothyroidism; and Hyperlipidemia. She  does not have any pertinent problems on file. She  has past surgical history that includes Tonsillectomy; Ovarian cyst surgery; Cataract extraction (05-2009); and Eye surgery (2010). Her family history includes Cancer in her father and mother; Heart disease in her brother; and Ovarian cancer in her sister. She  reports that she has never smoked. She does not have any smokeless tobacco history on file. She reports that she does not drink alcohol or use illicit drugs. She has a current medication list which includes the following prescription(s): calcium carbonate, levothyroxine, lisinopril-hydrochlorothiazide, multiple vitamins-minerals, and simvastatin. Current Outpatient Prescriptions on File Prior to Visit  Medication Sig Dispense Refill  . Calcium Carbonate (CALTRATE 600) 1500 MG TABS Take by mouth daily.        Marland Kitchen levothyroxine (SYNTHROID, LEVOTHROID) 75 MCG tablet TAKE 1 TABLET DAILY  90 tablet  0  .  lisinopril-hydrochlorothiazide (PRINZIDE,ZESTORETIC) 10-12.5 MG per tablet TAKE 1 TABLET DAILY  90 tablet  0  . Multiple Vitamins-Minerals (EYE VITAMINS PO) Take 1 tablet by mouth daily.      . simvastatin (ZOCOR) 20 MG tablet TAKE ONE-HALF (1/2) TABLET AT BEDTIME, OFFICE VISIT DUE NOW  45 tablet  0   She  has no known allergies.  Review of Systems  Review of Systems  Constitutional: Negative for activity change, appetite change and fatigue.  HENT: Negative for hearing loss, congestion, tinnitus and ear discharge.   Eyes: Negative for visual disturbance (see optho q1y -- vision corrected to 20/20 with glasses).  Respiratory: Negative for cough, chest tightness and shortness of breath.   Cardiovascular: Negative for chest pain, palpitations and leg swelling.  Gastrointestinal: Negative for abdominal pain, diarrhea, constipation and abdominal distention.  Genitourinary: Negative for urgency, frequency, decreased urine volume and difficulty urinating.  Musculoskeletal: Negative for back pain, arthralgias and gait problem.  Skin: Negative for color change, pallor and rash.  Neurological: Negative for dizziness, light-headedness, numbness and headaches.  Hematological: Negative for adenopathy. Does not bruise/bleed easily.  Psychiatric/Behavioral: Negative for suicidal ideas, confusion, sleep disturbance, self-injury, dysphoric mood, decreased concentration and agitation.  Pt is able to read and write and can do all ADLs No risk for falling No abuse/ violence in home    Objective:     Vision by Snellen chart: opth  Body mass index is 26.36 kg/(m^2). BP 134/70  Pulse 80  Temp 97.9 F (36.6 C) (Oral)  Ht 5\' 3"  (1.6 m)  Wt 148 lb 12.8 oz (67.495 kg)  BMI 26.36 kg/m2  SpO2 97%  BP 134/70  Pulse 80  Temp 97.9 F (36.6 C) (Oral)  Ht 5\' 3"  (1.6 m)  Wt 148 lb 12.8 oz (67.495 kg)  BMI 26.36 kg/m2  SpO2 97% General appearance: alert, cooperative, appears stated age and no  distress Head: Normocephalic, without obvious abnormality, atraumatic Eyes: conjunctivae/corneas clear. PERRL, EOM's intact. Fundi benign. Ears: b/l cerumen impaction Nose: Nares normal. Septum midline. Mucosa normal. No drainage or sinus tenderness. Throat: lips, mucosa, and tongue normal; teeth and gums normal Neck: no adenopathy, no carotid bruit, no JVD, supple, symmetrical, trachea midline and thyroid not enlarged, symmetric, no tenderness/mass/nodules Back: symmetric, no curvature. ROM normal. No CVA tenderness. Lungs: clear to auscultation bilaterally Breasts: normal appearance, no masses or tenderness Heart: regular rate and rhythm, S1, S2 normal, no murmur, click, rub or gallop Abdomen: soft, non-tender; bowel sounds normal; no masses,  no organomegaly Pelvic: not indicated; post-menopausal, no abnormal Pap smears in past Extremities: extremities normal, atraumatic, no cyanosis or edema Pulses: 2+ and symmetric Skin: Skin color, texture, turgor normal. No rashes or lesions---large SK under L breast-- no change Lymph nodes: Cervical, supraclavicular, and axillary nodes normal. Neurologic: Alert and oriented X 3, normal strength and tone. Normal symmetric reflexes. Normal coordination and gait Psych-- no depression, anxiety     Assessment:     cpe     Plan:     During the course of the visit the patient was educated and counseled about appropriate screening and preventive services including:    Pneumococcal vaccine   Influenza vaccine  Screening mammography  Bone densitometry screening  Advanced directives: has an advanced directive - a copy HAS NOT been provided.  Flu and pneumonia vaccine refused  Diet review for nutrition referral? Yes ____  Not Indicated __x__   Patient Instructions (the written plan) was given to the patient.  Medicare Attestation I have personally reviewed: The patient's medical and social history Their use of alcohol, tobacco or  illicit drugs Their current medications and supplements The patient's functional ability including ADLs,fall risks, home safety risks, cognitive, and hearing and visual impairment Diet and physical activities Evidence for depression or mood disorders  The patient's weight, height, BMI, and visual acuity have been recorded in the chart.  I have made referrals, counseling, and provided education to the patient based on review of the above and I have provided the patient with a written personalized care plan for preventive services.     Loreen Freud, DO   10/10/2012

## 2012-10-10 NOTE — Assessment & Plan Note (Signed)
Pt refusing irrigation or ent referral

## 2012-10-10 NOTE — Assessment & Plan Note (Signed)
Check labs 

## 2012-10-10 NOTE — Patient Instructions (Signed)
Preventive Care for Adults, Female A healthy lifestyle and preventive care can promote health and wellness. Preventive health guidelines for women include the following key practices.  A routine yearly physical is a good way to check with your caregiver about your health and preventive screening. It is a chance to share any concerns and updates on your health, and to receive a thorough exam.  Visit your dentist for a routine exam and preventive care every 6 months. Brush your teeth twice a day and floss once a day. Good oral hygiene prevents tooth decay and gum disease.  The frequency of eye exams is based on your age, health, family medical history, use of contact lenses, and other factors. Follow your caregiver's recommendations for frequency of eye exams.  Eat a healthy diet. Foods like vegetables, fruits, whole grains, low-fat dairy products, and lean protein foods contain the nutrients you need without too many calories. Decrease your intake of foods high in solid fats, added sugars, and salt. Eat the right amount of calories for you.Get information about a proper diet from your caregiver, if necessary.  Regular physical exercise is one of the most important things you can do for your health. Most adults should get at least 150 minutes of moderate-intensity exercise (any activity that increases your heart rate and causes you to sweat) each week. In addition, most adults need muscle-strengthening exercises on 2 or more days a week.  Maintain a healthy weight. The body mass index (BMI) is a screening tool to identify possible weight problems. It provides an estimate of body fat based on height and weight. Your caregiver can help determine your BMI, and can help you achieve or maintain a healthy weight.For adults 20 years and older:  A BMI below 18.5 is considered underweight.  A BMI of 18.5 to 24.9 is normal.  A BMI of 25 to 29.9 is considered overweight.  A BMI of 30 and above is  considered obese.  Maintain normal blood lipids and cholesterol levels by exercising and minimizing your intake of saturated fat. Eat a balanced diet with plenty of fruit and vegetables. Blood tests for lipids and cholesterol should begin at age 20 and be repeated every 5 years. If your lipid or cholesterol levels are high, you are over 50, or you are at high risk for heart disease, you may need your cholesterol levels checked more frequently.Ongoing high lipid and cholesterol levels should be treated with medicines if diet and exercise are not effective.  If you smoke, find out from your caregiver how to quit. If you do not use tobacco, do not start.  If you are pregnant, do not drink alcohol. If you are breastfeeding, be very cautious about drinking alcohol. If you are not pregnant and choose to drink alcohol, do not exceed 1 drink per day. One drink is considered to be 12 ounces (355 mL) of beer, 5 ounces (148 mL) of wine, or 1.5 ounces (44 mL) of liquor.  Avoid use of street drugs. Do not share needles with anyone. Ask for help if you need support or instructions about stopping the use of drugs.  High blood pressure causes heart disease and increases the risk of stroke. Your blood pressure should be checked at least every 1 to 2 years. Ongoing high blood pressure should be treated with medicines if weight loss and exercise are not effective.  If you are 55 to 76 years old, ask your caregiver if you should take aspirin to prevent strokes.  Diabetes   screening involves taking a blood sample to check your fasting blood sugar level. This should be done once every 3 years, after age 45, if you are within normal weight and without risk factors for diabetes. Testing should be considered at a younger age or be carried out more frequently if you are overweight and have at least 1 risk factor for diabetes.  Breast cancer screening is essential preventive care for women. You should practice "breast  self-awareness." This means understanding the normal appearance and feel of your breasts and may include breast self-examination. Any changes detected, no matter how small, should be reported to a caregiver. Women in their 20s and 30s should have a clinical breast exam (CBE) by a caregiver as part of a regular health exam every 1 to 3 years. After age 40, women should have a CBE every year. Starting at age 40, women should consider having a mammography (breast X-ray test) every year. Women who have a family history of breast cancer should talk to their caregiver about genetic screening. Women at a high risk of breast cancer should talk to their caregivers about having magnetic resonance imaging (MRI) and a mammography every year.  The Pap test is a screening test for cervical cancer. A Pap test can show cell changes on the cervix that might become cervical cancer if left untreated. A Pap test is a procedure in which cells are obtained and examined from the lower end of the uterus (cervix).  Women should have a Pap test starting at age 21.  Between ages 21 and 29, Pap tests should be repeated every 2 years.  Beginning at age 30, you should have a Pap test every 3 years as long as the past 3 Pap tests have been normal.  Some women have medical problems that increase the chance of getting cervical cancer. Talk to your caregiver about these problems. It is especially important to talk to your caregiver if a new problem develops soon after your last Pap test. In these cases, your caregiver may recommend more frequent screening and Pap tests.  The above recommendations are the same for women who have or have not gotten the vaccine for human papillomavirus (HPV).  If you had a hysterectomy for a problem that was not cancer or a condition that could lead to cancer, then you no longer need Pap tests. Even if you no longer need a Pap test, a regular exam is a good idea to make sure no other problems are  starting.  If you are between ages 65 and 70, and you have had normal Pap tests going back 10 years, you no longer need Pap tests. Even if you no longer need a Pap test, a regular exam is a good idea to make sure no other problems are starting.  If you have had past treatment for cervical cancer or a condition that could lead to cancer, you need Pap tests and screening for cancer for at least 20 years after your treatment.  If Pap tests have been discontinued, risk factors (such as a new sexual partner) need to be reassessed to determine if screening should be resumed.  The HPV test is an additional test that may be used for cervical cancer screening. The HPV test looks for the virus that can cause the cell changes on the cervix. The cells collected during the Pap test can be tested for HPV. The HPV test could be used to screen women aged 30 years and older, and should   be used in women of any age who have unclear Pap test results. After the age of 30, women should have HPV testing at the same frequency as a Pap test.  Colorectal cancer can be detected and often prevented. Most routine colorectal cancer screening begins at the age of 50 and continues through age 75. However, your caregiver may recommend screening at an earlier age if you have risk factors for colon cancer. On a yearly basis, your caregiver may provide home test kits to check for hidden blood in the stool. Use of a small camera at the end of a tube, to directly examine the colon (sigmoidoscopy or colonoscopy), can detect the earliest forms of colorectal cancer. Talk to your caregiver about this at age 50, when routine screening begins. Direct examination of the colon should be repeated every 5 to 10 years through age 75, unless early forms of pre-cancerous polyps or small growths are found.  Hepatitis C blood testing is recommended for all people born from 1945 through 1965 and any individual with known risks for hepatitis C.  Practice  safe sex. Use condoms and avoid high-risk sexual practices to reduce the spread of sexually transmitted infections (STIs). STIs include gonorrhea, chlamydia, syphilis, trichomonas, herpes, HPV, and human immunodeficiency virus (HIV). Herpes, HIV, and HPV are viral illnesses that have no cure. They can result in disability, cancer, and death. Sexually active women aged 25 and younger should be checked for chlamydia. Older women with new or multiple partners should also be tested for chlamydia. Testing for other STIs is recommended if you are sexually active and at increased risk.  Osteoporosis is a disease in which the bones lose minerals and strength with aging. This can result in serious bone fractures. The risk of osteoporosis can be identified using a bone density scan. Women ages 65 and over and women at risk for fractures or osteoporosis should discuss screening with their caregivers. Ask your caregiver whether you should take a calcium supplement or vitamin D to reduce the rate of osteoporosis.  Menopause can be associated with physical symptoms and risks. Hormone replacement therapy is available to decrease symptoms and risks. You should talk to your caregiver about whether hormone replacement therapy is right for you.  Use sunscreen with sun protection factor (SPF) of 30 or more. Apply sunscreen liberally and repeatedly throughout the day. You should seek shade when your shadow is shorter than you. Protect yourself by wearing long sleeves, pants, a wide-brimmed hat, and sunglasses year round, whenever you are outdoors.  Once a month, do a whole body skin exam, using a mirror to look at the skin on your back. Notify your caregiver of new moles, moles that have irregular borders, moles that are larger than a pencil eraser, or moles that have changed in shape or color.  Stay current with required immunizations.  Influenza. You need a dose every fall (or winter). The composition of the flu vaccine  changes each year, so being vaccinated once is not enough.  Pneumococcal polysaccharide. You need 1 to 2 doses if you smoke cigarettes or if you have certain chronic medical conditions. You need 1 dose at age 65 (or older) if you have never been vaccinated.  Tetanus, diphtheria, pertussis (Tdap, Td). Get 1 dose of Tdap vaccine if you are younger than age 65, are over 65 and have contact with an infant, are a healthcare worker, are pregnant, or simply want to be protected from whooping cough. After that, you need a Td   booster dose every 10 years. Consult your caregiver if you have not had at least 3 tetanus and diphtheria-containing shots sometime in your life or have a deep or dirty wound.  HPV. You need this vaccine if you are a woman age 26 or younger. The vaccine is given in 3 doses over 6 months.  Measles, mumps, rubella (MMR). You need at least 1 dose of MMR if you were born in 1957 or later. You may also need a second dose.  Meningococcal. If you are age 19 to 21 and a first-year college student living in a residence hall, or have one of several medical conditions, you need to get vaccinated against meningococcal disease. You may also need additional booster doses.  Zoster (shingles). If you are age 60 or older, you should get this vaccine.  Varicella (chickenpox). If you have never had chickenpox or you were vaccinated but received only 1 dose, talk to your caregiver to find out if you need this vaccine.  Hepatitis A. You need this vaccine if you have a specific risk factor for hepatitis A virus infection or you simply wish to be protected from this disease. The vaccine is usually given as 2 doses, 6 to 18 months apart.  Hepatitis B. You need this vaccine if you have a specific risk factor for hepatitis B virus infection or you simply wish to be protected from this disease. The vaccine is given in 3 doses, usually over 6 months. Preventive Services / Frequency Ages 19 to 39  Blood  pressure check.** / Every 1 to 2 years.  Lipid and cholesterol check.** / Every 5 years beginning at age 20.  Clinical breast exam.** / Every 3 years for women in their 20s and 30s.  Pap test.** / Every 2 years from ages 21 through 29. Every 3 years starting at age 30 through age 65 or 70 with a history of 3 consecutive normal Pap tests.  HPV screening.** / Every 3 years from ages 30 through ages 65 to 70 with a history of 3 consecutive normal Pap tests.  Hepatitis C blood test.** / For any individual with known risks for hepatitis C.  Skin self-exam. / Monthly.  Influenza immunization.** / Every year.  Pneumococcal polysaccharide immunization.** / 1 to 2 doses if you smoke cigarettes or if you have certain chronic medical conditions.  Tetanus, diphtheria, pertussis (Tdap, Td) immunization. / A one-time dose of Tdap vaccine. After that, you need a Td booster dose every 10 years.  HPV immunization. / 3 doses over 6 months, if you are 26 and younger.  Measles, mumps, rubella (MMR) immunization. / You need at least 1 dose of MMR if you were born in 1957 or later. You may also need a second dose.  Meningococcal immunization. / 1 dose if you are age 19 to 21 and a first-year college student living in a residence hall, or have one of several medical conditions, you need to get vaccinated against meningococcal disease. You may also need additional booster doses.  Varicella immunization.** / Consult your caregiver.  Hepatitis A immunization.** / Consult your caregiver. 2 doses, 6 to 18 months apart.  Hepatitis B immunization.** / Consult your caregiver. 3 doses usually over 6 months. Ages 40 to 64  Blood pressure check.** / Every 1 to 2 years.  Lipid and cholesterol check.** / Every 5 years beginning at age 20.  Clinical breast exam.** / Every year after age 40.  Mammogram.** / Every year beginning at age 40   and continuing for as long as you are in good health. Consult with your  caregiver.  Pap test.** / Every 3 years starting at age 30 through age 65 or 70 with a history of 3 consecutive normal Pap tests.  HPV screening.** / Every 3 years from ages 30 through ages 65 to 70 with a history of 3 consecutive normal Pap tests.  Fecal occult blood test (FOBT) of stool. / Every year beginning at age 50 and continuing until age 75. You may not need to do this test if you get a colonoscopy every 10 years.  Flexible sigmoidoscopy or colonoscopy.** / Every 5 years for a flexible sigmoidoscopy or every 10 years for a colonoscopy beginning at age 50 and continuing until age 75.  Hepatitis C blood test.** / For all people born from 1945 through 1965 and any individual with known risks for hepatitis C.  Skin self-exam. / Monthly.  Influenza immunization.** / Every year.  Pneumococcal polysaccharide immunization.** / 1 to 2 doses if you smoke cigarettes or if you have certain chronic medical conditions.  Tetanus, diphtheria, pertussis (Tdap, Td) immunization.** / A one-time dose of Tdap vaccine. After that, you need a Td booster dose every 10 years.  Measles, mumps, rubella (MMR) immunization. / You need at least 1 dose of MMR if you were born in 1957 or later. You may also need a second dose.  Varicella immunization.** / Consult your caregiver.  Meningococcal immunization.** / Consult your caregiver.  Hepatitis A immunization.** / Consult your caregiver. 2 doses, 6 to 18 months apart.  Hepatitis B immunization.** / Consult your caregiver. 3 doses, usually over 6 months. Ages 65 and over  Blood pressure check.** / Every 1 to 2 years.  Lipid and cholesterol check.** / Every 5 years beginning at age 20.  Clinical breast exam.** / Every year after age 40.  Mammogram.** / Every year beginning at age 40 and continuing for as long as you are in good health. Consult with your caregiver.  Pap test.** / Every 3 years starting at age 30 through age 65 or 70 with a 3  consecutive normal Pap tests. Testing can be stopped between 65 and 70 with 3 consecutive normal Pap tests and no abnormal Pap or HPV tests in the past 10 years.  HPV screening.** / Every 3 years from ages 30 through ages 65 or 70 with a history of 3 consecutive normal Pap tests. Testing can be stopped between 65 and 70 with 3 consecutive normal Pap tests and no abnormal Pap or HPV tests in the past 10 years.  Fecal occult blood test (FOBT) of stool. / Every year beginning at age 50 and continuing until age 75. You may not need to do this test if you get a colonoscopy every 10 years.  Flexible sigmoidoscopy or colonoscopy.** / Every 5 years for a flexible sigmoidoscopy or every 10 years for a colonoscopy beginning at age 50 and continuing until age 75.  Hepatitis C blood test.** / For all people born from 1945 through 1965 and any individual with known risks for hepatitis C.  Osteoporosis screening.** / A one-time screening for women ages 65 and over and women at risk for fractures or osteoporosis.  Skin self-exam. / Monthly.  Influenza immunization.** / Every year.  Pneumococcal polysaccharide immunization.** / 1 dose at age 65 (or older) if you have never been vaccinated.  Tetanus, diphtheria, pertussis (Tdap, Td) immunization. / A one-time dose of Tdap vaccine if you are over   65 and have contact with an infant, are a healthcare worker, or simply want to be protected from whooping cough. After that, you need a Td booster dose every 10 years.  Varicella immunization.** / Consult your caregiver.  Meningococcal immunization.** / Consult your caregiver.  Hepatitis A immunization.** / Consult your caregiver. 2 doses, 6 to 18 months apart.  Hepatitis B immunization.** / Check with your caregiver. 3 doses, usually over 6 months. ** Family history and personal history of risk and conditions may change your caregiver's recommendations. Document Released: 12/25/2001 Document Revised: 01/21/2012  Document Reviewed: 03/26/2011 [employer] Patient Information 2013 [employer].  

## 2012-10-10 NOTE — Assessment & Plan Note (Signed)
Stable con't meds 

## 2012-10-12 LAB — URINE CULTURE

## 2012-10-14 ENCOUNTER — Encounter: Payer: Medicare Other | Admitting: Family Medicine

## 2012-12-02 ENCOUNTER — Encounter: Payer: Medicare Other | Admitting: Family Medicine

## 2013-01-07 ENCOUNTER — Encounter: Payer: Self-pay | Admitting: Family Medicine

## 2013-02-24 ENCOUNTER — Other Ambulatory Visit: Payer: Self-pay | Admitting: *Deleted

## 2013-02-24 MED ORDER — LEVOTHYROXINE SODIUM 75 MCG PO TABS: 75.0000 ug | ORAL_TABLET | Freq: Every day | ORAL | Status: DC

## 2013-02-24 MED ORDER — SIMVASTATIN 20 MG PO TABS: ORAL_TABLET | ORAL | Status: DC

## 2013-02-24 MED ORDER — LISINOPRIL-HYDROCHLOROTHIAZIDE 10-12.5 MG PO TABS: 1.0000 | ORAL_TABLET | Freq: Every day | ORAL | Status: DC

## 2013-02-24 NOTE — Telephone Encounter (Signed)
Rx's sent to the pharmacy by e-script.//AB/CMA 

## 2013-12-04 ENCOUNTER — Other Ambulatory Visit: Payer: Self-pay

## 2013-12-04 MED ORDER — LISINOPRIL-HYDROCHLOROTHIAZIDE 10-12.5 MG PO TABS: 1.0000 | ORAL_TABLET | Freq: Every day | ORAL | Status: DC

## 2014-03-03 ENCOUNTER — Telehealth: Payer: Self-pay

## 2014-03-03 NOTE — Telephone Encounter (Signed)
Medication and allergies:  Reviewed and updated  90 day supply/mail order: n/a Local pharmacy:  [facility] - [place] - [id number]   Immunizations due:  See below   A/P: No changes to personal, family history or past surgical hx CCS- declined MMG- 10/24/12- negative Tdap- due PNA- due Shingles- due  To Discuss with Provider: linsinopril-hydrochlorothiazide

## 2014-03-04 ENCOUNTER — Ambulatory Visit (INDEPENDENT_AMBULATORY_CARE_PROVIDER_SITE_OTHER): Payer: Medicare Other | Admitting: Family Medicine

## 2014-03-04 ENCOUNTER — Encounter: Payer: Self-pay | Admitting: Family Medicine

## 2014-03-04 VITALS — BP 122/72 | HR 86 | Temp 98.1°F | Wt 144.4 lb

## 2014-03-04 DIAGNOSIS — Z0000 Encounter for general adult medical examination without abnormal findings: Secondary | ICD-10-CM

## 2014-03-04 DIAGNOSIS — I10 Essential (primary) hypertension: Secondary | ICD-10-CM

## 2014-03-04 DIAGNOSIS — E785 Hyperlipidemia, unspecified: Secondary | ICD-10-CM

## 2014-03-04 DIAGNOSIS — Z23 Encounter for immunization: Secondary | ICD-10-CM

## 2014-03-04 LAB — LIPID PANEL
CHOLESTEROL: 177 mg/dL (ref 0–200)
HDL: 63.9 mg/dL (ref >39.00)
LDL Cholesterol: 98 mg/dL (ref 0–99)
Total CHOL/HDL Ratio: 3
Triglycerides: 74 mg/dL (ref 0.0–149.0)
VLDL: 14.8 mg/dL (ref 0.0–40.0)

## 2014-03-04 LAB — POCT URINALYSIS DIPSTICK
BILIRUBIN UA: NEGATIVE
Blood, UA: NEGATIVE
GLUCOSE UA: NEGATIVE
KETONES UA: NEGATIVE
Leukocytes, UA: NEGATIVE
Nitrite, UA: NEGATIVE
PROTEIN UA: NEGATIVE
SPEC GRAV UA: 1.015
Urobilinogen, UA: 0.2
pH, UA: 6

## 2014-03-04 LAB — BASIC METABOLIC PANEL
BUN: 20 mg/dL (ref 6–23)
CHLORIDE: 103 meq/L (ref 96–112)
CO2: 29 meq/L (ref 19–32)
Calcium: 9.9 mg/dL (ref 8.4–10.5)
Creatinine, Ser: 0.9 mg/dL (ref 0.4–1.2)
GFR: 60.86 mL/min (ref >60.00)
GLUCOSE: 85 mg/dL (ref 70–99)
POTASSIUM: 3.7 meq/L (ref 3.5–5.1)
SODIUM: 139 meq/L (ref 135–145)

## 2014-03-04 LAB — CBC WITH DIFFERENTIAL/PLATELET
BASOS ABS: 0.1 10*3/uL (ref 0.0–0.1)
Basophils Relative: 0.5 % (ref 0.0–3.0)
Eosinophils Absolute: 0.2 10*3/uL (ref 0.0–0.7)
Eosinophils Relative: 1.7 % (ref 0.0–5.0)
HEMATOCRIT: 45.1 % (ref 36.0–46.0)
HEMOGLOBIN: 15.2 g/dL — AB (ref 12.0–15.0)
LYMPHS ABS: 1.9 10*3/uL (ref 0.7–4.0)
Lymphocytes Relative: 19.6 % (ref 12.0–46.0)
MCHC: 33.7 g/dL (ref 30.0–36.0)
MCV: 90.3 fl (ref 78.0–100.0)
MONOS PCT: 6.3 % (ref 3.0–12.0)
Monocytes Absolute: 0.6 10*3/uL (ref 0.1–1.0)
NEUTROS ABS: 6.9 10*3/uL (ref 1.4–7.7)
Neutrophils Relative %: 71.9 % (ref 43.0–77.0)
Platelets: 203 10*3/uL (ref 150.0–400.0)
RBC: 4.99 Mil/uL (ref 3.87–5.11)
RDW: 13.4 % (ref 11.5–14.6)
WBC: 9.6 10*3/uL (ref 4.5–10.5)

## 2014-03-04 LAB — HEPATIC FUNCTION PANEL
ALBUMIN: 4.1 g/dL (ref 3.5–5.2)
ALT: 18 U/L (ref 0–35)
AST: 17 U/L (ref 0–37)
Alkaline Phosphatase: 71 U/L (ref 39–117)
Bilirubin, Direct: 0 mg/dL (ref 0.0–0.3)
Total Bilirubin: 0.7 mg/dL (ref 0.3–1.2)
Total Protein: 7.5 g/dL (ref 6.0–8.3)

## 2014-03-04 LAB — TSH: TSH: 3.45 u[IU]/mL (ref 0.35–5.50)

## 2014-03-04 MED ORDER — ZOSTER VACCINE LIVE 19400 UNT/0.65ML ~~LOC~~ SOLR: 0.6500 mL | Freq: Once | SUBCUTANEOUS | Status: DC

## 2014-03-04 MED ORDER — TETANUS-DIPHTH-ACELL PERTUSSIS 5-2.5-18.5 LF-MCG/0.5 IM SUSP: 0.5000 mL | Freq: Once | INTRAMUSCULAR | Status: DC

## 2014-03-04 NOTE — Progress Notes (Signed)
Subjective:    [name] [name] is a 80 y.o. female who presents for Medicare Annual/Subsequent preventive examination.  Preventive Screening-Counseling & Management  Tobacco History  Smoking status  . Never Smoker   Smokeless tobacco  . Not on file     Problems Prior to Visit 1.   Current Problems (verified) Patient Active Problem List   Diagnosis Date Noted  . HEMATURIA UNSPECIFIED 11/01/2009  . BACK PAIN 11/01/2009  . HYPOTHYROIDISM 07/11/2008  . CERUMEN IMPACTION, BILATERAL 07/01/2008  . DIZZINESS 07/01/2008  . HYPERTENSION 01/15/2008  . KNEE PAIN, RIGHT 01/15/2008  . OSTEOPOROSIS 01/15/2008  . TONSILLECTOMY AND ADENOIDECTOMY, HX OF 01/15/2008    Medications Prior to Visit Current Outpatient Prescriptions on File Prior to Visit  Medication Sig Dispense Refill  . Calcium Carbonate (CALTRATE 600) 1500 MG TABS Take by mouth daily.        Marland Kitchen levothyroxine (SYNTHROID, LEVOTHROID) 75 MCG tablet Take 1 tablet (75 mcg total) by mouth daily.  30 tablet  11  . lisinopril-hydrochlorothiazide (PRINZIDE,ZESTORETIC) 10-12.5 MG per tablet Take 1 tablet by mouth daily.  30 tablet  5  . Multiple Vitamins-Minerals (EYE VITAMINS PO) Take 1 tablet by mouth daily.      . simvastatin (ZOCOR) 20 MG tablet TAKE ONE-HALF (1/2) TABLET AT BEDTIME,OFFICE VISIT DUE  45 tablet  5   No current facility-administered medications on file prior to visit.    Current Medications (verified) Current Outpatient Prescriptions  Medication Sig Dispense Refill  . Calcium Carbonate (CALTRATE 600) 1500 MG TABS Take by mouth daily.        Marland Kitchen levothyroxine (SYNTHROID, LEVOTHROID) 75 MCG tablet Take 1 tablet (75 mcg total) by mouth daily.  30 tablet  11  . lisinopril-hydrochlorothiazide (PRINZIDE,ZESTORETIC) 10-12.5 MG per tablet Take 1 tablet by mouth daily.  30 tablet  5  . Multiple Vitamins-Minerals (EYE VITAMINS PO) Take 1 tablet by mouth daily.      . simvastatin (ZOCOR) 20 MG tablet TAKE ONE-HALF (1/2)  TABLET AT BEDTIME,OFFICE VISIT DUE  45 tablet  5  . Tdap (BOOSTRIX) 5-2.5-18.5 LF-MCG/0.5 injection Inject 0.5 mLs into the muscle once.  0.5 mL  0  . zoster vaccine live, PF, (ZOSTAVAX) [id number] UNT/0.65ML injection Inject 19,400 Units into the skin once.  1 vial  0   No current facility-administered medications for this visit.     Allergies (verified) Review of patient's allergies indicates no known allergies.   PAST HISTORY  Family History Family History  Problem Relation Age of Onset  . Cancer Mother     bladder  . Cancer Father     unknown  . Ovarian cancer Sister   . Heart disease Brother     2 brothers    Social History History  Substance Use Topics  . Smoking status: Never Smoker   . Smokeless tobacco: Not on file  . Alcohol Use: No     Are there smokers in your home (other than you)? No  Risk Factors Current exercise habits: The patient does not participate in regular exercise at present.  Dietary issues discussed: na   Cardiac risk factors: advanced age (older than 89 for men, 74 for women), dyslipidemia, hypertension and sedentary lifestyle.  Depression Screen (Note: if answer to either of the following is "Yes", a more complete depression screening is indicated)   Over the past two weeks, have you felt down, depressed or hopeless? No  Over the past two weeks, have you felt little interest or pleasure in doing  things? No  Have you lost interest or pleasure in daily life? No  Do you often feel hopeless? No  Do you cry easily over simple problems? No  Activities of Daily Living In your present state of health, do you have any difficulty performing the following activities?:  Driving? No Managing money?  No Feeding yourself? No Getting from bed to chair? No Climbing a flight of stairs? No Preparing food and eating?: No Bathing or showering? No Getting dressed: No Getting to the toilet? No Using the toilet:No Moving around from place to place: No In  the past year have you fallen or had a near fall?:No   Are you sexually active?  Yes  Do you have more than one partner?  No  Hearing Difficulties: No Do you often ask people to speak up or repeat themselves? No Do you experience ringing or noises in your ears? No Do you have difficulty understanding soft or whispered voices? No   Do you feel that you have a problem with memory? No  Do you often misplace items? No  Do you feel safe at home?  No  Cognitive Testing  Alert? Yes  Normal Appearance?Yes  Oriented to person? Yes  Place? Yes   Time? Yes  Recall of three objects?  Yes  Can perform simple calculations? Yes  Displays appropriate judgment?Yes  Can read the correct time from a watch face?Yes   Advanced Directives have been discussed with the patient? Yes  List the Names of Other Physician/Practitioners you currently use: 1.  oph-- [place] 2.  Dentist--- [facility]  Indicate any recent Medical Services you may have received from other than [facility] providers in the past year (date may be approximate).   There is no immunization history on file for this patient.  Screening Tests Health Maintenance  Topic Date Due  . Tetanus/tdap  11/15/1947  . Zostavax  11/14/1988  . Mammogram  10/24/2013  . Pneumococcal Polysaccharide Vaccine Age 78 And Over  03/05/2015 (Originally 11/14/1993)  . Influenza Vaccine  06/12/2014  . Colonoscopy  11/02/2019    All answers were reviewed with the patient and necessary referrals were made:  Loreen FreudYvonne Lowne, DO   03/04/2014   History reviewed:  She  has a past medical history of Hypertension; Osteoporosis; Hypothyroidism; and Hyperlipidemia. She  does not have any pertinent problems on file. She  has past surgical history that includes Tonsillectomy; Ovarian cyst surgery; Cataract extraction (05-2009); and Eye surgery (2010). Her family history includes Cancer in her father and mother; Heart disease in her brother; Ovarian cancer in her sister. She   reports that she has never smoked. She does not have any smokeless tobacco history on file. She reports that she does not drink alcohol or use illicit drugs. She has a current medication list which includes the following prescription(s): calcium carbonate, levothyroxine, lisinopril-hydrochlorothiazide, multiple vitamins-minerals, simvastatin, tdap, and zoster vaccine live (pf). Current Outpatient Prescriptions on File Prior to Visit  Medication Sig Dispense Refill  . Calcium Carbonate (CALTRATE 600) 1500 MG TABS Take by mouth daily.        Marland Kitchen. levothyroxine (SYNTHROID, LEVOTHROID) 75 MCG tablet Take 1 tablet (75 mcg total) by mouth daily.  30 tablet  11  . lisinopril-hydrochlorothiazide (PRINZIDE,ZESTORETIC) 10-12.5 MG per tablet Take 1 tablet by mouth daily.  30 tablet  5  . Multiple Vitamins-Minerals (EYE VITAMINS PO) Take 1 tablet by mouth daily.      . simvastatin (ZOCOR) 20 MG tablet TAKE ONE-HALF (1/2) TABLET  AT BEDTIME,OFFICE VISIT DUE  45 tablet  5   No current facility-administered medications on file prior to visit.   She has No Known Allergies.  Review of Systems  Review of Systems  Constitutional: Negative for activity change, appetite change and fatigue.  HENT: Negative for hearing loss, congestion, tinnitus and ear discharge.   Eyes: Negative for visual disturbance (see optho q1y -- vision corrected to 20/20 with glasses).  Respiratory: Negative for cough, chest tightness and shortness of breath.   Cardiovascular: Negative for chest pain, palpitations and leg swelling.  Gastrointestinal: Negative for abdominal pain, diarrhea, constipation and abdominal distention.  Genitourinary: Negative for urgency, frequency, decreased urine volume and difficulty urinating.  Musculoskeletal: Negative for back pain, arthralgias and gait problem.  Skin: Negative for color change, pallor and rash.  Neurological: Negative for dizziness, light-headedness, numbness and headaches.  Hematological:  Negative for adenopathy. Does not bruise/bleed easily.  Psychiatric/Behavioral: Negative for suicidal ideas, confusion, sleep disturbance, self-injury, dysphoric mood, decreased concentration and agitation.  Pt is able to read and write and can do all ADLs No risk for falling No abuse/ violence in home      Objective:     Vision by Snellen chart: opth  Body mass index is 25.59 kg/(m^2). BP 122/72  Pulse 86  Temp(Src) 98.1 F (36.7 C) (Oral)  Wt 144 lb 6.4 oz (65.499 kg)  SpO2 98%  BP 122/72  Pulse 86  Temp(Src) 98.1 F (36.7 C) (Oral)  Wt 144 lb 6.4 oz (65.499 kg)  SpO2 98% General appearance: alert, cooperative, appears stated age and no distress Head: Normocephalic, without obvious abnormality, atraumatic Eyes: conjunctivae/corneas clear. PERRL, EOM's intact. Fundi benign. Ears: normal TM's and external ear canals both ears Nose: Nares normal. Septum midline. Mucosa normal. No drainage or sinus tenderness. Throat: lips, mucosa, and tongue normal; teeth and gums normal Neck: no adenopathy, no carotid bruit, no JVD, supple, symmetrical, trachea midline and thyroid not enlarged, symmetric, no tenderness/mass/nodules Back: symmetric, no curvature. ROM normal. No CVA tenderness. Lungs: clear to auscultation bilaterally Breasts: normal appearance, no masses or tenderness Heart: regular rate and rhythm, S1, S2 normal, no murmur, click, rub or gallop Abdomen: soft, non-tender; bowel sounds normal; no masses,  no organomegaly Pelvic: not indicated; post-menopausal, no abnormal Pap smears in past Extremities: extremities normal, atraumatic, no cyanosis or edema Pulses: 2+ and symmetric Skin: Skin color, texture, turgor normal. No rashes or lesions Lymph nodes: Cervical, supraclavicular, and axillary nodes normal. Neurologic: Alert and oriented X 3, normal strength and tone. Normal symmetric reflexes. Normal coordination and gait Psych-- no depression, no anxiety       Assessment:     cpe      Plan:     During the course of the visit the patient was educated and counseled about appropriate screening and preventive services including:    Bone densitometry screening  Diabetes screening  Glaucoma screening  Advanced directives: has NO advanced directive  - add't info requested. Referral to SW: no  Diet review for nutrition referral? Yes ____  Not Indicated ___x_  1. Need for prophylactic vaccination with combined diphtheria-tetanus-pertussis (DTP) vaccine Pt refusing vaccines at this time because ins will not cover - Tdap (BOOSTRIX) 5-2.5-18.5 LF-MCG/0.5 injection; Inject 0.5 mLs into the muscle once.  Dispense: 0.5 mL; Refill: 0  2. Need for shingles vaccine See above - zoster vaccine live, PF, (ZOSTAVAX) [id number] UNT/0.65ML injection; Inject 19,400 Units into the skin once.  Dispense: 1 vial; Refill: 0  3. Medicare  annual wellness visit, subsequent    4. HTN (hypertension) Stable, con't meds - Basic metabolic panel - CBC with Differential - POCT urinalysis dipstick - TSH - EKG 12-Lead  5. Other and unspecified hyperlipidemia con't meds, check labs - Hepatic function panel - Lipid panel - POCT urinalysis dipstick - TSH - EKG 12-Lead    Patient Instructions (the written plan) was given to the patient.  Medicare Attestation I have personally reviewed: The patient's medical and social history Their use of alcohol, tobacco or illicit drugs Their current medications and supplements The patient's functional ability including ADLs,fall risks, home safety risks, cognitive, and hearing and visual impairment Diet and physical activities Evidence for depression or mood disorders  The patient's weight, height, BMI, and visual acuity have been recorded in the chart.  I have made referrals, counseling, and provided education to the patient based on review of the above and I have provided the patient with a written personalized care  plan for preventive services.     Loreen Freud, DO   03/04/2014

## 2014-03-04 NOTE — Patient Instructions (Signed)

## 2014-03-04 NOTE — Progress Notes (Signed)
Pre visit review using our clinic review tool, if applicable. No additional management support is needed unless otherwise documented below in the visit note. 

## 2014-03-05 ENCOUNTER — Telehealth: Payer: Self-pay | Admitting: Family Medicine

## 2014-03-05 NOTE — Telephone Encounter (Signed)
Relevant patient education mailed to patient.  

## 2014-03-15 ENCOUNTER — Other Ambulatory Visit: Payer: Self-pay

## 2014-03-15 MED ORDER — SIMVASTATIN 20 MG PO TABS: ORAL_TABLET | ORAL | Status: DC

## 2014-04-12 ENCOUNTER — Telehealth: Payer: Self-pay | Admitting: Family Medicine

## 2014-04-12 MED ORDER — LEVOTHYROXINE SODIUM 75 MCG PO TABS: 75.0000 ug | ORAL_TABLET | Freq: Every day | ORAL | Status: DC

## 2014-04-12 NOTE — Telephone Encounter (Signed)
No request received. Rx faxed      KP

## 2014-04-12 NOTE — Telephone Encounter (Signed)
Noreene Larsson from [facility] called and stated that she has sent numerous request to refill patient's Synthroid medication. Please advise.

## 2014-06-17 ENCOUNTER — Other Ambulatory Visit: Payer: Self-pay

## 2014-06-17 MED ORDER — SIMVASTATIN 20 MG PO TABS: ORAL_TABLET | ORAL | Status: DC

## 2014-07-02 ENCOUNTER — Other Ambulatory Visit: Payer: Self-pay

## 2014-07-02 MED ORDER — LISINOPRIL-HYDROCHLOROTHIAZIDE 10-12.5 MG PO TABS: 1.0000 | ORAL_TABLET | Freq: Every day | ORAL | Status: DC

## 2014-07-21 ENCOUNTER — Ambulatory Visit (INDEPENDENT_AMBULATORY_CARE_PROVIDER_SITE_OTHER): Payer: Medicare Other

## 2014-07-21 VITALS — BP 120/81 | HR 70 | Resp 15 | Ht 64.0 in | Wt 145.0 lb

## 2014-07-21 DIAGNOSIS — B351 Tinea unguium: Secondary | ICD-10-CM

## 2014-07-21 DIAGNOSIS — M19071 Primary osteoarthritis, right ankle and foot: Secondary | ICD-10-CM

## 2014-07-21 DIAGNOSIS — M79671 Pain in right foot: Secondary | ICD-10-CM

## 2014-07-21 DIAGNOSIS — M7750 Other enthesopathy of unspecified foot: Secondary | ICD-10-CM

## 2014-07-21 DIAGNOSIS — IMO0002 Reserved for concepts with insufficient information to code with codable children: Secondary | ICD-10-CM

## 2014-07-21 DIAGNOSIS — S93324S Dislocation of tarsometatarsal joint of right foot, sequela: Secondary | ICD-10-CM

## 2014-07-21 DIAGNOSIS — M778 Other enthesopathies, not elsewhere classified: Secondary | ICD-10-CM

## 2014-07-21 DIAGNOSIS — M779 Enthesopathy, unspecified: Secondary | ICD-10-CM

## 2014-07-21 DIAGNOSIS — M79609 Pain in unspecified limb: Secondary | ICD-10-CM

## 2014-07-21 DIAGNOSIS — M19079 Primary osteoarthritis, unspecified ankle and foot: Secondary | ICD-10-CM

## 2014-07-21 MED ORDER — MELOXICAM 15 MG PO TABS: 15.0000 mg | ORAL_TABLET | Freq: Every day | ORAL | Status: DC

## 2014-07-21 NOTE — Patient Instructions (Signed)

## 2014-07-21 NOTE — Progress Notes (Signed)
   Subjective:    Patient ID: [name], female    DOB: July 03, 1927, 80 y.o.   MRN: [id number]  HPI Comments: N knot  L right anterior lateral midfoot D 1.5 years O history of falling on concrete C swelling and painful knot A worse when weightbearing T cream for arthritis and massage  Foot Pain Associated symptoms include fatigue.      Review of Systems  Constitutional: Positive for fatigue.  All other systems reviewed and are negative.      Objective:   Physical Exam 80 year old white female well-developed well-nourished oriented history presents at this time with complaint of pain in her right foot an injury or trauma to the right foot more than 10 years ago continues to have a bumper lump on the top of the midfoot Lisfranc joint third fourth metatarsal area in particular. There is no soft tissue mass over heart osseous prominences identified dorsally x-rays confirm dorsal spurring osteophytes Lisfranc fracture dislocation with some subluxation at the Lisfranc joint making articulation third making form seems to be the most significant. There is asymmetric joint space narrowing and arthrosis noted  Vascular status is intact with pedal pulses palpable DP +2/4 bilateral PT one over 4 bilateral mild varicosities noted neurologically epicritic and proprioceptive sensations intact and symmetric bilateral there is normal plantar response DTRs not elicited dermatologically skin color pigment normal hair growth absent nails somewhat criptotic incurvated and friable. Of second digits bilateral and thick and brittle crumbly patient request debridement is been applying Fungi-Nail heavily for the past month or 2 advised she is to continue to do so for [place] 12 month minimum duration and the Fungi-Nail should be beneficial at this time get x-rays reveal no other osseous abnormalities arthrosis Lisfranc joint with subluxation dislocation some dorsal spurring osteophytes noted clinically and  radiographically.       Assessment & Plan:  Assessment this time is onychomycosis of the nail second bilateral continue with Fungi-Nail as instructed twice daily for 12 months duration.  Capsulitis with posture arthropathy and Lisfranc's fracture dislocation third fourth met cuneiform articulation in particular at this time maintain a stiff soled shoe stable shoe no flimsy shoes or flip-flops no tight or constrictive shoes a be candidate for future steroid injection possibly an ostectomy or possibly even a tarsometatarsal fusions although should be a last resort. Patient Myriam Jacobson symptoms are not that severe that she can live with at this point followup in the future as needed recommend a stable stiff soled shoe at all times prescription for MOBIC 15 mg once daily as needed for pain is issued we use for any breakthrough pain otherwise will use over-the-counter Tylenol. Recheck within the next one to 3 months or as needed for a followup or exacerbations again treatment for the tinea last for at least 12 months duration as instructed  Harriet Masson DPM

## 2014-09-06 ENCOUNTER — Other Ambulatory Visit (INDEPENDENT_AMBULATORY_CARE_PROVIDER_SITE_OTHER): Payer: Medicare Other

## 2014-09-06 DIAGNOSIS — I10 Essential (primary) hypertension: Secondary | ICD-10-CM

## 2014-09-06 LAB — LIPID PANEL
CHOL/HDL RATIO: 3
Cholesterol: 220 mg/dL — ABNORMAL HIGH (ref 0–200)
HDL: 63.4 mg/dL (ref >39.00)
LDL CALC: 142 mg/dL — AB (ref 0–99)
NONHDL: 156.6
Triglycerides: 73 mg/dL (ref 0.0–149.0)
VLDL: 14.6 mg/dL (ref 0.0–40.0)

## 2014-09-06 LAB — HEPATIC FUNCTION PANEL
ALT: 15 U/L (ref 0–35)
AST: 17 U/L (ref 0–37)
Albumin: 3.5 g/dL (ref 3.5–5.2)
Alkaline Phosphatase: 65 U/L (ref 39–117)
BILIRUBIN DIRECT: 0 mg/dL (ref 0.0–0.3)
Total Bilirubin: 0.6 mg/dL (ref 0.2–1.2)
Total Protein: 7.2 g/dL (ref 6.0–8.3)

## 2014-10-21 ENCOUNTER — Telehealth: Payer: Self-pay | Admitting: Family Medicine

## 2014-10-21 NOTE — Telephone Encounter (Signed)
FYI for MD.      KP 

## 2014-10-21 NOTE — Telephone Encounter (Signed)
Caller name: Lenward ChancellorDon Rivers  Relation to pt: Pharmist Medication Therapy Managment for [employer]  Call back number: [phone]   Reason for call: FYI-  Lenward ChancellorDon Rivers called on behalf of patient,in regards to patient taking simvastatin (ZOCOR) 20 MG tablet again. Contacted patient and advised pt to schedule an appointment patient stated she will wait until 3 months until blood work.

## 2014-10-29 ENCOUNTER — Encounter: Payer: Self-pay | Admitting: Family Medicine

## 2015-01-21 ENCOUNTER — Encounter: Payer: Self-pay | Admitting: Family Medicine

## 2015-01-24 ENCOUNTER — Other Ambulatory Visit: Payer: Self-pay

## 2015-01-24 DIAGNOSIS — E785 Hyperlipidemia, unspecified: Secondary | ICD-10-CM

## 2015-01-25 ENCOUNTER — Other Ambulatory Visit (INDEPENDENT_AMBULATORY_CARE_PROVIDER_SITE_OTHER): Payer: Medicare Other

## 2015-01-25 DIAGNOSIS — E785 Hyperlipidemia, unspecified: Secondary | ICD-10-CM

## 2015-01-25 LAB — HEPATIC FUNCTION PANEL
ALT: 16 U/L (ref 0–35)
AST: 17 U/L (ref 0–37)
Albumin: 4 g/dL (ref 3.5–5.2)
Alkaline Phosphatase: 69 U/L (ref 39–117)
BILIRUBIN DIRECT: 0 mg/dL (ref 0.0–0.3)
BILIRUBIN TOTAL: 0.5 mg/dL (ref 0.2–1.2)
Total Protein: 7 g/dL (ref 6.0–8.3)

## 2015-01-25 LAB — LIPID PANEL
CHOL/HDL RATIO: 3
Cholesterol: 200 mg/dL (ref 0–200)
HDL: 58.2 mg/dL (ref >39.00)
LDL Cholesterol: 121 mg/dL — ABNORMAL HIGH (ref 0–99)
NonHDL: 141.8
TRIGLYCERIDES: 104 mg/dL (ref 0.0–149.0)
VLDL: 20.8 mg/dL (ref 0.0–40.0)

## 2015-03-30 ENCOUNTER — Other Ambulatory Visit: Payer: Self-pay

## 2015-03-30 MED ORDER — LISINOPRIL-HYDROCHLOROTHIAZIDE 10-12.5 MG PO TABS: 1.0000 | ORAL_TABLET | Freq: Every day | ORAL | Status: DC

## 2015-05-02 ENCOUNTER — Other Ambulatory Visit: Payer: Self-pay

## 2015-05-02 MED ORDER — LISINOPRIL-HYDROCHLOROTHIAZIDE 10-12.5 MG PO TABS: 1.0000 | ORAL_TABLET | Freq: Every day | ORAL | Status: DC

## 2015-05-02 MED ORDER — LEVOTHYROXINE SODIUM 75 MCG PO TABS: 75.0000 ug | ORAL_TABLET | Freq: Every day | ORAL | Status: DC

## 2015-06-03 ENCOUNTER — Ambulatory Visit: Payer: Medicare Other | Admitting: Family Medicine

## 2015-06-08 ENCOUNTER — Other Ambulatory Visit: Payer: Self-pay

## 2015-06-08 MED ORDER — LEVOTHYROXINE SODIUM 75 MCG PO TABS: 75.0000 ug | ORAL_TABLET | Freq: Every day | ORAL | Status: DC

## 2015-06-08 MED ORDER — LISINOPRIL-HYDROCHLOROTHIAZIDE 10-12.5 MG PO TABS: 1.0000 | ORAL_TABLET | Freq: Every day | ORAL | Status: DC

## 2015-06-14 ENCOUNTER — Encounter: Payer: Self-pay | Admitting: Family Medicine

## 2015-06-14 ENCOUNTER — Ambulatory Visit (INDEPENDENT_AMBULATORY_CARE_PROVIDER_SITE_OTHER): Payer: Medicare Other | Admitting: Family Medicine

## 2015-06-14 VITALS — BP 130/72 | HR 63 | Temp 98.1°F | Ht 64.0 in | Wt 143.4 lb

## 2015-06-14 DIAGNOSIS — I10 Essential (primary) hypertension: Secondary | ICD-10-CM

## 2015-06-14 DIAGNOSIS — R5383 Other fatigue: Secondary | ICD-10-CM

## 2015-06-14 DIAGNOSIS — E039 Hypothyroidism, unspecified: Secondary | ICD-10-CM | POA: Diagnosis not present

## 2015-06-14 LAB — CBC WITH DIFFERENTIAL/PLATELET
BASOS ABS: 0 10*3/uL (ref 0.0–0.1)
BASOS PCT: 0.4 % (ref 0.0–3.0)
EOS ABS: 0.1 10*3/uL (ref 0.0–0.7)
EOS PCT: 1.2 % (ref 0.0–5.0)
HCT: 44.2 % (ref 36.0–46.0)
Hemoglobin: 14.8 g/dL (ref 12.0–15.0)
LYMPHS ABS: 1.9 10*3/uL (ref 0.7–4.0)
Lymphocytes Relative: 22.8 % (ref 12.0–46.0)
MCHC: 33.5 g/dL (ref 30.0–36.0)
MCV: 90.9 fl (ref 78.0–100.0)
Monocytes Absolute: 0.5 10*3/uL (ref 0.1–1.0)
Monocytes Relative: 5.7 % (ref 3.0–12.0)
NEUTROS ABS: 5.9 10*3/uL (ref 1.4–7.7)
NEUTROS PCT: 69.9 % (ref 43.0–77.0)
Platelets: 155 10*3/uL (ref 150.0–400.0)
RBC: 4.87 Mil/uL (ref 3.87–5.11)
RDW: 13.2 % (ref 11.5–15.5)
WBC: 8.5 10*3/uL (ref 4.0–10.5)

## 2015-06-14 LAB — LIPID PANEL
CHOLESTEROL: 216 mg/dL — AB (ref 0–200)
HDL: 65.7 mg/dL (ref >39.00)
LDL Cholesterol: 126 mg/dL — ABNORMAL HIGH (ref 0–99)
NONHDL: 150.79
Total CHOL/HDL Ratio: 3
Triglycerides: 124 mg/dL (ref 0.0–149.0)
VLDL: 24.8 mg/dL (ref 0.0–40.0)

## 2015-06-14 LAB — HEPATIC FUNCTION PANEL
ALT: 15 U/L (ref 0–35)
AST: 18 U/L (ref 0–37)
Albumin: 4.2 g/dL (ref 3.5–5.2)
Alkaline Phosphatase: 64 U/L (ref 39–117)
BILIRUBIN TOTAL: 0.7 mg/dL (ref 0.2–1.2)
Bilirubin, Direct: 0.1 mg/dL (ref 0.0–0.3)
Total Protein: 7.2 g/dL (ref 6.0–8.3)

## 2015-06-14 LAB — BASIC METABOLIC PANEL
BUN: 24 mg/dL — AB (ref 6–23)
CO2: 29 meq/L (ref 19–32)
Calcium: 9.8 mg/dL (ref 8.4–10.5)
Chloride: 104 mEq/L (ref 96–112)
Creatinine, Ser: 0.97 mg/dL (ref 0.40–1.20)
GFR: 57.79 mL/min — ABNORMAL LOW (ref >60.00)
Glucose, Bld: 93 mg/dL (ref 70–99)
POTASSIUM: 3.8 meq/L (ref 3.5–5.1)
SODIUM: 140 meq/L (ref 135–145)

## 2015-06-14 LAB — TSH: TSH: 16.84 u[IU]/mL — ABNORMAL HIGH (ref 0.35–4.50)

## 2015-06-14 LAB — VITAMIN B12: VITAMIN B 12: 453 pg/mL (ref 211–911)

## 2015-06-14 MED ORDER — LEVOTHYROXINE SODIUM 75 MCG PO TABS: 75.0000 ug | ORAL_TABLET | Freq: Every day | ORAL | Status: DC

## 2015-06-14 MED ORDER — LISINOPRIL-HYDROCHLOROTHIAZIDE 10-12.5 MG PO TABS: 1.0000 | ORAL_TABLET | Freq: Every day | ORAL | Status: DC

## 2015-06-14 NOTE — Progress Notes (Signed)
Pre visit review using our clinic review tool, if applicable. No additional management support is needed unless otherwise documented below in the visit note. 

## 2015-06-14 NOTE — Progress Notes (Signed)
Patient ID: [name] [name], female    DOB: Dec 06, 1926  Age: 81 y.o. MRN: [id number]    Subjective:  Subjective HPI [name] [name] presents for f/u htn, cholesterol, hyperlipidemia.  No cp, sob, palpitations.    Review of Systems  Constitutional: Negative for diaphoresis, appetite change, fatigue and unexpected weight change.  Eyes: Negative for pain, redness and visual disturbance.  Respiratory: Negative for cough, chest tightness, shortness of breath and wheezing.   Cardiovascular: Negative for chest pain, palpitations and leg swelling.  Endocrine: Negative for cold intolerance, heat intolerance, polydipsia, polyphagia and polyuria.  Genitourinary: Negative for dysuria, frequency and difficulty urinating.  Neurological: Negative for dizziness, light-headedness, numbness and headaches.    History Past Medical History  Diagnosis Date  . Hypertension   . Osteoporosis   . Hypothyroidism   . Hyperlipidemia     She has past surgical history that includes Tonsillectomy; Ovarian cyst surgery; Cataract extraction (05-2009); and Eye surgery (2010).   Her family history includes Cancer in her father and mother; Heart disease in her brother; Ovarian cancer in her sister.She reports that she has never smoked. She does not have any smokeless tobacco history on file. She reports that she does not drink alcohol or use illicit drugs.  Current Outpatient Prescriptions on File Prior to Visit  Medication Sig Dispense Refill  . Calcium Carbonate (CALTRATE 600) 1500 MG TABS Take by mouth daily.      . Multiple Vitamins-Minerals (EYE VITAMINS PO) Take 1 tablet by mouth daily.     No current facility-administered medications on file prior to visit.     Objective:  Objective Physical Exam  Constitutional: She is oriented to person, place, and time. She appears well-developed and well-nourished.  HENT:  Head: Normocephalic and atraumatic.  Eyes: Conjunctivae and EOM are normal.  Neck: Normal range  of motion. Neck supple. No JVD present. Carotid bruit is not present. No thyromegaly present.  Cardiovascular: Normal rate, regular rhythm and normal heart sounds.   No murmur heard. Pulmonary/Chest: Effort normal and breath sounds normal. No respiratory distress. She has no wheezes. She has no rales. She exhibits no tenderness.  Musculoskeletal: She exhibits no edema.  Neurological: She is alert and oriented to person, place, and time.  Psychiatric: She has a normal mood and affect. Her behavior is normal.   BP 130/72 mmHg  Pulse 63  Temp(Src) 98.1 F (36.7 C) (Oral)  Ht  (1.626 m)  Wt 143 lb 6.4 oz (65.046 kg)  BMI 24.60 kg/m2  SpO2 97% Wt Readings from Last 3 Encounters:  06/14/15 143 lb 6.4 oz (65.046 kg)  07/21/14 145 lb (65.772 kg)  03/04/14 144 lb 6.4 oz (65.499 kg)     Lab Results  Component Value Date   WBC 8.5 06/14/2015   HGB 14.8 06/14/2015   HCT 44.2 06/14/2015   PLT 155.0 06/14/2015   GLUCOSE 93 06/14/2015   CHOL 216* 06/14/2015   TRIG 124.0 06/14/2015   HDL 65.70 06/14/2015   LDLDIRECT 157.4 01/29/2011   LDLCALC 126* 06/14/2015   ALT 15 06/14/2015   AST 18 06/14/2015   NA 140 06/14/2015   K 3.8 06/14/2015   CL 104 06/14/2015   CREATININE 0.97 06/14/2015   BUN 24* 06/14/2015   CO2 29 06/14/2015   TSH 16.84* 06/14/2015    Dg Knee Complete 4 Views Right  06/19/2012   *RADIOLOGY REPORT*  Clinical Data: Fall.  Knee injury.  Knee pain and swelling.  RIGHT KNEE - COMPLETE 4+  VIEW  Comparison: 01/15/2008  Findings: No evidence of fracture or dislocation.  Small knee joint effusion noted.  Chondrocalcinosis also demonstrated.  No evidence of joint space narrowing or sclerosis.  No other bone abnormality identified.  IMPRESSION:  1.  No evidence of fracture. 2.  Small knee joint effusion and chondrocalcinosis. CPPD arthropathy cannot be excluded.  Original Report Authenticated By: Danae Orleans, M.D.    Assessment & Plan:  Plan I have discontinued [name] meloxicam. I have also changed her lisinopril-hydrochlorothiazide. Additionally, I am having her maintain her Calcium Carbonate, Multiple Vitamins-Minerals (EYE VITAMINS PO), and levothyroxine.  Meds ordered this encounter  Medications  . levothyroxine (SYNTHROID, LEVOTHROID) 75 MCG tablet    Sig: Take 1 tablet (75 mcg total) by mouth daily.    Dispense:  90 tablet    Refill:  3    D/C PREVIOUS SCRIPTS FOR THIS MEDICATION  . lisinopril-hydrochlorothiazide (PRINZIDE,ZESTORETIC) 10-12.5 MG per tablet    Sig: Take 1 tablet by mouth daily.    Dispense:  90 tablet    Refill:  3    Problem List Items Addressed This Visit    None    Visit Diagnoses    Essential hypertension  (Chronic)  (Active)  -  Primary    Relevant Medications    lisinopril-hydrochlorothiazide (PRINZIDE,ZESTORETIC) 10-12.5 MG per tablet    Other Relevant Orders    Basic metabolic panel (Completed)    Hepatic function panel (Completed)    Lipid panel (Completed)    ECHOCARDIOGRAM COMPLETE    EKG 12-Lead (Completed)    Essential hypertension        Relevant Medications    lisinopril-hydrochlorothiazide (PRINZIDE,ZESTORETIC) 10-12.5 MG per tablet    Other Relevant Orders    Basic metabolic panel (Completed)    Hepatic function panel (Completed)    Lipid panel (Completed)    ECHOCARDIOGRAM COMPLETE    EKG 12-Lead (Completed)    Hypothyroidism, unspecified hypothyroidism type        Relevant Medications    levothyroxine (SYNTHROID, LEVOTHROID) 75 MCG tablet    Other Relevant Orders    TSH (Completed)    Other fatigue        Relevant Orders    CBC with Differential/Platelet (Completed)    Vitamin B12 (Completed)    EKG 12-Lead (Completed)       Follow-up: Return in about 6 months (around 12/15/2015), or if symptoms worsen or fail to improve, for annual exam, fasting.  Loreen Freud, DO

## 2015-06-14 NOTE — Patient Instructions (Addendum)
Use debrox or colace capsule (poke hole in capsule) in your ear to soften the wax.     Cerumen Impaction A cerumen impaction is when the wax in your ear forms a plug. This plug usually causes reduced hearing. Sometimes it also causes an earache or dizziness. Removing a cerumen impaction can be difficult and painful. The wax sticks to the ear canal. The canal is sensitive and bleeds easily. If you try to remove a heavy wax buildup with a cotton tipped swab, you may push it in further. Irrigation with water, suction, and small ear curettes may be used to clear out the wax. If the impaction is fixed to the skin in the ear canal, ear drops may be needed for a few days to loosen the wax. People who build up a lot of wax frequently can use ear wax removal products available in your local drugstore. SEEK MEDICAL CARE IF:  You develop an earache, increased hearing loss, or marked dizziness. Document Released: 12/06/2004 Document Revised: 01/21/2012 Document Reviewed: 01/26/2010 [facility] Patient Information 2015 [place], [place]. This information is not intended to replace advice given to you by your health care provider. Make sure you discuss any questions you have with your health care provider.

## 2015-06-17 ENCOUNTER — Other Ambulatory Visit: Payer: Self-pay

## 2015-06-17 DIAGNOSIS — E039 Hypothyroidism, unspecified: Secondary | ICD-10-CM

## 2015-06-17 MED ORDER — LEVOTHYROXINE SODIUM 88 MCG PO TABS: 88.0000 ug | ORAL_TABLET | Freq: Every day | ORAL | Status: DC

## 2015-06-20 ENCOUNTER — Telehealth: Payer: Self-pay | Admitting: *Deleted

## 2015-06-20 NOTE — Telephone Encounter (Signed)
yes

## 2015-06-20 NOTE — Telephone Encounter (Signed)
Patient scheduled for ear irrigation 06/22/15 for nurse visit.  Was seen 06/14/15 for cerumen impaction- ok to irrigate?

## 2015-06-22 ENCOUNTER — Ambulatory Visit (HOSPITAL_BASED_OUTPATIENT_CLINIC_OR_DEPARTMENT_OTHER)
Admission: RE | Admit: 2015-06-22 | Discharge: 2015-06-22 | Disposition: A | Discharge status: Home / Self Care | Payer: Medicare Other | Source: Ambulatory Visit | Attending: Family Medicine | Admitting: Family Medicine

## 2015-06-22 DIAGNOSIS — I517 Cardiomegaly: Secondary | ICD-10-CM | POA: Insufficient documentation

## 2015-06-22 DIAGNOSIS — I10 Essential (primary) hypertension: Secondary | ICD-10-CM | POA: Insufficient documentation

## 2015-06-22 DIAGNOSIS — I340 Nonrheumatic mitral (valve) insufficiency: Secondary | ICD-10-CM | POA: Diagnosis not present

## 2015-06-22 NOTE — Progress Notes (Signed)
*  PRELIMINARY RESULTS* Echocardiogram 2D Echocardiogram has been performed.  [name] [name] 06/22/2015, 2:31 PM

## 2015-06-28 ENCOUNTER — Telehealth: Payer: Self-pay | Admitting: Family Medicine

## 2015-06-28 NOTE — Telephone Encounter (Signed)
Pt would like for you to call her back when you get a chance states she does not understand what you told her on yesterday regarding her imaging

## 2015-06-29 NOTE — Telephone Encounter (Signed)
Msg left to call the office     KP 

## 2015-06-29 NOTE — Telephone Encounter (Signed)
Pt returned your call, states she would like a copy of the imaging test because she does not understand

## 2015-07-01 NOTE — Telephone Encounter (Signed)
Patient wanted to know what the B-12 was and if her levels were normal, I advised yes and she asked for a copy of her Echo report. Copy mailed.     KP

## 2015-09-05 ENCOUNTER — Encounter (HOSPITAL_BASED_OUTPATIENT_CLINIC_OR_DEPARTMENT_OTHER): Payer: Self-pay | Admitting: *Deleted

## 2015-09-05 ENCOUNTER — Emergency Department (HOSPITAL_BASED_OUTPATIENT_CLINIC_OR_DEPARTMENT_OTHER): Payer: Medicare Other

## 2015-09-05 ENCOUNTER — Observation Stay (HOSPITAL_BASED_OUTPATIENT_CLINIC_OR_DEPARTMENT_OTHER)
Admission: EM | Admit: 2015-09-05 | Discharge: 2015-09-06 | Disposition: A | Discharge status: Home / Self Care | Payer: Medicare Other | Attending: Cardiology | Admitting: Cardiology

## 2015-09-05 DIAGNOSIS — E039 Hypothyroidism, unspecified: Secondary | ICD-10-CM | POA: Diagnosis present

## 2015-09-05 DIAGNOSIS — R9431 Abnormal electrocardiogram [ECG] [EKG]: Secondary | ICD-10-CM | POA: Diagnosis not present

## 2015-09-05 DIAGNOSIS — N183 Chronic kidney disease, stage 3 unspecified: Secondary | ICD-10-CM

## 2015-09-05 DIAGNOSIS — R072 Precordial pain: Secondary | ICD-10-CM

## 2015-09-05 DIAGNOSIS — I10 Essential (primary) hypertension: Secondary | ICD-10-CM | POA: Diagnosis not present

## 2015-09-05 DIAGNOSIS — E785 Hyperlipidemia, unspecified: Secondary | ICD-10-CM | POA: Diagnosis not present

## 2015-09-05 DIAGNOSIS — R079 Chest pain, unspecified: Principal | ICD-10-CM | POA: Diagnosis present

## 2015-09-05 DIAGNOSIS — I129 Hypertensive chronic kidney disease with stage 1 through stage 4 chronic kidney disease, or unspecified chronic kidney disease: Secondary | ICD-10-CM | POA: Insufficient documentation

## 2015-09-05 HISTORY — DX: Unspecified osteoarthritis, unspecified site: M19.90

## 2015-09-05 LAB — COMPREHENSIVE METABOLIC PANEL
ALBUMIN: 4.2 g/dL (ref 3.5–5.0)
ALK PHOS: 68 U/L (ref 38–126)
ALT: 21 U/L (ref 14–54)
ANION GAP: 6 (ref 5–15)
AST: 21 U/L (ref 15–41)
BUN: 23 mg/dL — ABNORMAL HIGH (ref 6–20)
CHLORIDE: 109 mmol/L (ref 101–111)
CO2: 26 mmol/L (ref 22–32)
Calcium: 9.7 mg/dL (ref 8.9–10.3)
Creatinine, Ser: 1.06 mg/dL — ABNORMAL HIGH (ref 0.44–1.00)
GFR calc Af Amer: 54 mL/min — ABNORMAL LOW (ref >60)
GFR calc non Af Amer: 46 mL/min — ABNORMAL LOW (ref >60)
GLUCOSE: 105 mg/dL — AB (ref 65–99)
POTASSIUM: 4.2 mmol/L (ref 3.5–5.1)
Sodium: 141 mmol/L (ref 135–145)
Total Bilirubin: 0.6 mg/dL (ref 0.3–1.2)
Total Protein: 7.5 g/dL (ref 6.5–8.1)

## 2015-09-05 LAB — T4, FREE: FREE T4: 1.06 ng/dL (ref 0.61–1.12)

## 2015-09-05 LAB — CBC
HEMATOCRIT: 46.6 % — AB (ref 36.0–46.0)
HEMOGLOBIN: 15.5 g/dL — AB (ref 12.0–15.0)
MCH: 30 pg (ref 26.0–34.0)
MCHC: 33.3 g/dL (ref 30.0–36.0)
MCV: 90.1 fL (ref 78.0–100.0)
Platelets: 120 10*3/uL — ABNORMAL LOW (ref 150–400)
RBC: 5.17 MIL/uL — ABNORMAL HIGH (ref 3.87–5.11)
RDW: 12.8 % (ref 11.5–15.5)
WBC: 8 10*3/uL (ref 4.0–10.5)

## 2015-09-05 LAB — PROTIME-INR
INR: 0.96 (ref 0.00–1.49)
PROTHROMBIN TIME: 13 s (ref 11.6–15.2)

## 2015-09-05 LAB — TROPONIN I

## 2015-09-05 LAB — TSH: TSH: 4.301 u[IU]/mL (ref 0.350–4.500)

## 2015-09-05 LAB — LIPASE, BLOOD: Lipase: 37 U/L (ref 11–51)

## 2015-09-05 MED ORDER — ONDANSETRON HCL 4 MG/2ML IJ SOLN: 4.0000 mg | Freq: Four times a day (QID) | INTRAMUSCULAR | Status: DC | PRN

## 2015-09-05 MED ORDER — HYDROCHLOROTHIAZIDE 12.5 MG PO CAPS
12.5000 mg | ORAL_CAPSULE | Freq: Every day | ORAL | Status: DC
  Administered 2015-09-05 – 2015-09-06 (×2): 12.5 mg via ORAL
  Filled 2015-09-05 (×2): qty 1

## 2015-09-05 MED ORDER — SODIUM CHLORIDE 0.9 % IJ SOLN: 3.0000 mL | INTRAMUSCULAR | Status: DC | PRN

## 2015-09-05 MED ORDER — SODIUM CHLORIDE 0.9 % IV SOLN: 250.0000 mL | INTRAVENOUS | Status: DC | PRN

## 2015-09-05 MED ORDER — ASPIRIN 81 MG PO CHEW
324.0000 mg | CHEWABLE_TABLET | Freq: Once | ORAL | Status: AC
  Administered 2015-09-05: 324 mg via ORAL
  Filled 2015-09-05: qty 4

## 2015-09-05 MED ORDER — HEPARIN SODIUM (PORCINE) 5000 UNIT/ML IJ SOLN
5000.0000 [IU] | Freq: Three times a day (TID) | INTRAMUSCULAR | Status: DC
  Filled 2015-09-05: qty 1

## 2015-09-05 MED ORDER — PANTOPRAZOLE SODIUM 40 MG PO TBEC
40.0000 mg | DELAYED_RELEASE_TABLET | Freq: Once | ORAL | Status: DC
  Filled 2015-09-05 (×2): qty 1

## 2015-09-05 MED ORDER — LEVOTHYROXINE SODIUM 88 MCG PO TABS
88.0000 ug | ORAL_TABLET | Freq: Every day | ORAL | Status: DC
  Administered 2015-09-06: 88 ug via ORAL
  Filled 2015-09-05 (×2): qty 1

## 2015-09-05 MED ORDER — NITROGLYCERIN 0.4 MG SL SUBL: 0.4000 mg | SUBLINGUAL_TABLET | SUBLINGUAL | Status: DC | PRN

## 2015-09-05 MED ORDER — ACETAMINOPHEN 325 MG PO TABS: 650.0000 mg | ORAL_TABLET | ORAL | Status: DC | PRN

## 2015-09-05 MED ORDER — LISINOPRIL-HYDROCHLOROTHIAZIDE 10-12.5 MG PO TABS: 1.0000 | ORAL_TABLET | Freq: Every day | ORAL | Status: DC

## 2015-09-05 MED ORDER — ASPIRIN EC 81 MG PO TBEC
81.0000 mg | DELAYED_RELEASE_TABLET | Freq: Every day | ORAL | Status: DC
  Administered 2015-09-06: 81 mg via ORAL
  Filled 2015-09-05: qty 1

## 2015-09-05 MED ORDER — SODIUM CHLORIDE 0.9 % IJ SOLN
3.0000 mL | Freq: Two times a day (BID) | INTRAMUSCULAR | Status: DC
  Administered 2015-09-05 – 2015-09-06 (×2): 3 mL via INTRAVENOUS

## 2015-09-05 MED ORDER — LISINOPRIL 10 MG PO TABS
10.0000 mg | ORAL_TABLET | Freq: Every day | ORAL | Status: DC
  Administered 2015-09-05 – 2015-09-06 (×2): 10 mg via ORAL
  Filled 2015-09-05 (×2): qty 1

## 2015-09-05 MED ORDER — SODIUM CHLORIDE 0.9 % IV BOLUS (SEPSIS)
250.0000 mL | Freq: Once | INTRAVENOUS | Status: AC
  Administered 2015-09-05: 250 mL via INTRAVENOUS

## 2015-09-05 NOTE — ED Provider Notes (Signed)
CSN: [id number]     Arrival date & time 09/05/15  1023 History   First MD Initiated Contact with Patient 09/05/15 1026     No chief complaint on file.    (Consider location/radiation/quality/duration/timing/severity/associated sxs/prior Treatment) The history is provided by the patient, a relative, medical records and a significant other. No language interpreter was used.     [name] [name] is a 81 y.o. female  with a hx of hypertension, osteoporosis, hypothyroidism, hyperlipidemia presents to the Emergency Department complaining of intermittent right-sided chest pain described as being uncomfortable beginning yesterday afternoon around 3 PM after eating a large lunch of [employer] do. Patient complains of associated abdominal distention and belching. She also complains of posterior neck pain. Patient reports she had a recent echocardiogram however she has not had a cardiac catheterization in the past. She denies history of known CAD or MI. She denies associated diaphoresis, nausea, dizziness, near syncope, fever, chills, cough, URI symptoms, urinary symptoms. She reports ongoing complaints of fatigue for many months for which her primary care has assessed her and no diagnosis was found.  Patient reports that her last episode of chest pain was approximately one hour ago and is currently resolved. She states she is chest pain-free at this time. No aggravating or alleviating factors.    Past Medical History  Diagnosis Date  . Hypertension   . Osteoporosis   . Hypothyroidism   . Hyperlipidemia    Past Surgical History  Procedure Laterality Date  . Tonsillectomy    . Ovarian cyst surgery    . Cataract extraction  05-2009    Right  . Eye surgery  2010    dr Hazle Quant-- R eye   Family History  Problem Relation Age of Onset  . Cancer Mother     bladder  . Cancer Father     unknown  . Ovarian cancer Sister   . Heart disease Brother     2 brothers   Social History  Substance Use Topics   . Smoking status: Never Smoker   . Smokeless tobacco: None  . Alcohol Use: No   OB History    No data available     Review of Systems  Constitutional: Positive for fever. Negative for diaphoresis, appetite change, fatigue and unexpected weight change.  HENT: Negative for mouth sores.   Eyes: Negative for visual disturbance.  Respiratory: Negative for cough, chest tightness, shortness of breath and wheezing.   Cardiovascular: Positive for chest pain.  Gastrointestinal: Positive for abdominal distention. Negative for nausea, vomiting, abdominal pain, diarrhea and constipation.  Endocrine: Negative for polydipsia, polyphagia and polyuria.  Genitourinary: Negative for dysuria, urgency, frequency and hematuria.  Musculoskeletal: Negative for back pain and neck stiffness.  Skin: Negative for rash.  Allergic/Immunologic: Negative for immunocompromised state.  Neurological: Negative for syncope, light-headedness and headaches.  Hematological: Does not bruise/bleed easily.  Psychiatric/Behavioral: Negative for sleep disturbance. The patient is not nervous/anxious.       Allergies  Review of patient's allergies indicates no known allergies.  Home Medications   Prior to Admission medications   Medication Sig Start Date End Date Taking? Authorizing Provider  Calcium Carbonate (CALTRATE 600) 1500 MG TABS Take by mouth daily.     Yes Historical Provider, MD  levothyroxine (SYNTHROID, LEVOTHROID) 88 MCG tablet Take 1 tablet (88 mcg total) by mouth daily. 06/17/15  Yes Lelon Perla, DO  lisinopril-hydrochlorothiazide (PRINZIDE,ZESTORETIC) 10-12.5 MG per tablet Take 1 tablet by mouth daily. 06/14/15  Yes Lelon Perla,  DO  Multiple Vitamins-Minerals (EYE VITAMINS PO) Take 1 tablet by mouth daily.   Yes Historical Provider, MD   BP 152/62 mmHg  Pulse 71  Temp(Src) 98.2 F (36.8 C) (Oral)  Resp 20  Ht 5\' 4"  (1.626 m)  Wt 140 lb (63.504 kg)  BMI 24.02 kg/m2  SpO2 100% Physical Exam   Constitutional: She appears well-developed and well-nourished. No distress.  Awake, alert, nontoxic appearance  HENT:  Head: Normocephalic and atraumatic.  Nose: Nose normal.  Mouth/Throat: Oropharynx is clear and moist. No oropharyngeal exudate.  Bilateral cerumen impaction obstructing the TMs  Eyes: Conjunctivae are normal. No scleral icterus.  Neck: Normal range of motion. Neck supple.  Cardiovascular: Normal rate, regular rhythm, normal heart sounds and intact distal pulses.   No murmur heard. Pulmonary/Chest: Effort normal and breath sounds normal. No respiratory distress. She has no wheezes.  Equal chest expansion Clear and equal  Abdominal: Soft. Bowel sounds are normal. She exhibits no mass. There is no tenderness. There is no rebound and no guarding.  Soft and nontender  Musculoskeletal: Normal range of motion. She exhibits no edema.  Neurological: She is alert.  Speech is clear and goal oriented Moves extremities without ataxia  Skin: Skin is warm and dry. She is not diaphoretic.  Psychiatric: She has a normal mood and affect.  Nursing note and vitals reviewed.   ED Course  Procedures (including critical care time) Labs Review Labs Reviewed  CBC - Abnormal; Notable for the following:    RBC 5.17 (*)    Hemoglobin 15.5 (*)    HCT 46.6 (*)    Platelets 120 (*)    All other components within normal limits  COMPREHENSIVE METABOLIC PANEL - Abnormal; Notable for the following:    Glucose, Bld 105 (*)    BUN 23 (*)    Creatinine, Ser 1.06 (*)    GFR calc non Af Amer 46 (*)    GFR calc Af Amer 54 (*)    All other components within normal limits  TROPONIN I  LIPASE, BLOOD  CBC WITH DIFFERENTIAL/PLATELET  TROPONIN I    Imaging Review Dg Chest 2 View  09/05/2015  CLINICAL DATA:  One day history of chest pain EXAM: CHEST  2 VIEW COMPARISON:  None. FINDINGS: There is no edema or consolidation. The heart size and pulmonary vascularity are normal. No adenopathy. There  is calcification in the mitral annulus. No pneumothorax. No bone lesions. IMPRESSION: No edema or consolidation. Electronically Signed   By: Bretta BangWilliam  Woodruff III M.D.   On: 09/05/2015 11:38   I have personally reviewed and evaluated these images and lab results as part of my medical decision-making.   EKG Interpretation   Date/Time:  Monday September 05 2015 10:33:57 EDT Ventricular Rate:  78 PR Interval:  154 QRS Duration: 80 QT Interval:  392 QTC Calculation: 446 R Axis:   67 Text Interpretation:  Normal sinus rhythm Nonspecific ST abnormality  Abnormal ECG Confirmed by MESNER MD, Barbara CowerJASON [phone]([id number]) on 09/05/2015 10:42:34  AM       MDM   Final diagnoses:  Chest pain, unspecified chest pain type  Essential hypertension   Asencion Gowdaarrie S Tippen presents with complaints of intermittent chest pain with associated abdominal distention and belching after large meal yesterday. No known cardiac history.    12 PM Patient has refused Protonix, normal saline bolus. She denies further episodes of chest pain. She is constantly asking for discharge home to go home and take her Alka-Seltzer. She states  she knows there is nothing wrong with her heart. Labs are pending.  1 PM Patient with elevated hemoglobin and serum creatinine consistent with dehydration. I again spoke to the patient about my preference for normal saline for rehydration. She continues to refuse. Initial troponin drawn at 1 hour post onset of pain is negative. EKG is concerning with T-wave inversions in leads 23 and aVF with minimal elevation in aVR - consistent with left main disease.  I discussed my concerns with the patient for an abnormal EKG and her feelings of indigestion for the last 24 hours. I have recommended admission to the hospital with subsequent stress test and cardiology evaluation.  She currently declines. I have discussed my concerns with the patient's daughter and her husband at bedside.     2:16 PM Patient labs are only  somewhat reassuring with a negative troponin. She does have elevated hemoglobin and creatinine suggestive of dehydration. Patient remains chest pain-free but reports that she had a very short episode after walking to the bathroom.   2:27 PM Discussed with Dr. [place] of cardiology who agrees that EKG pattern is consistent with left main disease. He echoes my concern for admission. I have discussed again with the patient who continues to decline. Dr. [place] will attempt to schedule very close outpatient follow-up.  2:50PM Patient now consenting to admission. Reconsult Dr. [place] who will admit.  Posterior ECG without evidence of ST elevation.  Patient continues to refuse fluid bolus.  BP 152/62 mmHg  Pulse 71  Temp(Src) 98.2 F (36.8 C) (Oral)  Resp 20  Ht  (1.626 m)  Wt 140 lb (63.504 kg)  BMI 24.02 kg/m2  SpO2 100%  The patient was discussed with and seen by Dr. Clayborne Dana who agrees with the treatment plan.   Dahlia Client Tawny Raspberry, PA-C 09/05/15 7829  Marily Memos, MD 09/06/15 [phone]

## 2015-09-05 NOTE — ED Notes (Signed)
Patient transported to radiology via stretcher

## 2015-09-05 NOTE — ED Notes (Signed)
Pt refused asa and protonix. PA aware.

## 2015-09-05 NOTE — ED Notes (Signed)
C/o pain in right side of chest that is intermittant. Pt had recent echo cardiagram. C/o belching. C/o back of neck pain.

## 2015-09-05 NOTE — H&P (Signed)
History and Physical  Patient ID: Asencion GowdaCarrie S Blankenburg MRN: [id number], DOB: 01/31/1927 Date of Encounter: 09/05/2015, 5:15 PM Primary Physician: Loreen FreudYvonne Lowne, DO Primary Cardiologist: New to Dr. SwazilandJordan  Chief Complaint: chest pain Reason for Admission: chest pain, abnormal EKG  HPI: Ms. Joan Floreslley is an 81 y/o never smoker with history of HTN, hypothyroidism, HLD (managed with diet), and family history of heart disease but no personal history of heart issues. Prior cardiac eval includes 2D Echo 06/2015 done over the summer for fatigue showing moderate LVH, EF 60-65%, grade 1 DD, high ventricular filling pressure, mild MR, mild LAE, PASP 36mmHg. She [facility] today at the urging of her daughter for evaluation of chest pain. This began yesterday intermittently, without particular pattern, lasting a couple seconds at a time. It would ease off for 30 minutes - 3 hours then return again without provocation. It has not been worse with palpation or exertion. She has not had associated nausea, vomting, diaphoresis, palpitations or dizziness. She thought it may be indigestion as she's had in the past but she denies that it felt like burning - just felt like a small "hurting" pain. EKG was found to have diffuse mild ST depression thus [facility]. She initially declined, then agreed. She received 324mg  ASA. Vitals in the ER revealed moderately elevated BP although she had not yet taken her BP meds today before coming to the ER. Labwork reveals neg troponin x 1, BUN/Cr 23/1.06, glucose 105, Hgb 15.5, plt 120. CXR without edema or consolidation. She said she had brief recurrence of CP on the ride over but is chest pain free now. No LEE, h/o TIA/stroke or bleeding.   Past Medical History  Diagnosis Date  . Hypertension   . Osteoporosis   . Hypothyroidism   . Hyperlipidemia      Surgical History:  Past Surgical History  Procedure Laterality Date  . Tonsillectomy     . Ovarian cyst surgery    . Cataract extraction  05-2009    Right  . Eye surgery  2010    dr Hazle Quantdigby-- R eye     Home Meds: Prior to Admission medications   Medication Sig Start Date End Date Taking? Authorizing Provider  Calcium Carbonate (CALTRATE 600) 1500 MG TABS Take by mouth daily.     Yes Historical Provider, MD  levothyroxine (SYNTHROID, LEVOTHROID) 88 MCG tablet Take 1 tablet (88 mcg total) by mouth daily. 06/17/15  Yes Lelon PerlaYvonne R Lowne, DO  lisinopril-hydrochlorothiazide (PRINZIDE,ZESTORETIC) 10-12.5 MG per tablet Take 1 tablet by mouth daily. 06/14/15  Yes Lelon PerlaYvonne R Lowne, DO  Multiple Vitamins-Minerals (EYE VITAMINS PO) Take 1 tablet by mouth daily.   Yes Historical Provider, MD    Allergies: No Known Allergies  Social History   Social History  . Marital Status: Married    Spouse Name: N/A  . Number of Children: N/A  . Years of Education: N/A   Occupational History  . Not on file.   Social History Main Topics  . Smoking status: Never Smoker   . Smokeless tobacco: Not on file  . Alcohol Use: No  . Drug Use: No  . Sexual Activity:    Partners: Male   Other Topics Concern  . Not on file   Social History Narrative     Family History  Problem Relation Age of Onset  . Cancer Mother     bladder  . Cancer Father     unknown  .  Ovarian cancer Sister   . Heart disease Brother     All 5 brothers have had heart issues starting in their 48s    Review of Systems: All other systems reviewed and are otherwise negative except as noted above.  Labs:   Lab Results  Component Value Date   WBC 8.0 09/05/2015   HGB 15.5* 09/05/2015   HCT 46.6* 09/05/2015   MCV 90.1 09/05/2015   PLT 120* 09/05/2015    Recent Labs Lab 09/05/15 1155  NA 141  K 4.2  CL 109  CO2 26  BUN 23*  CREATININE 1.06*  CALCIUM 9.7  PROT 7.5  BILITOT 0.6  ALKPHOS 68  ALT 21  AST 21  GLUCOSE 105*    Recent Labs  09/05/15 1120  TROPONINI <0.03   Lab Results  Component Value  Date   CHOL 216* 06/14/2015   HDL 65.70 06/14/2015   LDLCALC 126* 06/14/2015   TRIG 124.0 06/14/2015   No results found for: DDIMER  Radiology/Studies:  Dg Chest 2 View  09/05/2015  CLINICAL DATA:  One day history of chest pain EXAM: CHEST  2 VIEW COMPARISON:  None. FINDINGS: There is no edema or consolidation. The heart size and pulmonary vascularity are normal. No adenopathy. There is calcification in the mitral annulus. No pneumothorax. No bone lesions. IMPRESSION: No edema or consolidation. Electronically Signed   By: Bretta Bang III M.D.   On: 09/05/2015 11:38   Wt Readings from Last 3 Encounters:  09/05/15 140 lb (63.504 kg)  06/14/15 143 lb 6.4 oz (65.046 kg)  07/21/14 145 lb (65.772 kg)    EKG:  1) NSR 78bpm nonspecific ST sagging inlead II, III, avF, V3-V6 (new from before) 2) NSR 83bpm low voltage QRS, inferior ST sagging still present with TWI V3 but ST depression resolved V4-V6 (tracing appears different than prior however - ? Lead placement given low voltage)  Physical Exam: Blood pressure 163/99, pulse 71, temperature 97.9 F (36.6 C), temperature source Axillary, resp. rate 18, height  (1.626 m), weight 140 lb (63.504 kg), SpO2 100 %. General: Well developed, well nourished WF, in no acute distress. Head: Normocephalic, atraumatic, sclera non-icteric, no xanthomas, nares are without discharge.  Neck: Negative for carotid bruits. JVD not elevated. Lungs: Clear bilaterally to auscultation without wheezes, rales, or rhonchi. Breathing is unlabored. Heart: RRR with S1 S2. No murmurs, rubs, or gallops appreciated. Abdomen: Soft, non-tender, non-distended with normoactive bowel sounds. No hepatomegaly. No rebound/guarding. No obvious abdominal masses. Msk:  Strength and tone appear normal for age. Extremities: No clubbing or cyanosis. No edema.  Distal pedal pulses are 2+ and equal bilaterally. Neuro: Alert and oriented X 3. No focal deficit. No facial asymmetry.  Moves all extremities spontaneously. Psych:  Responds to questions appropriately with a normal affect.    ASSESSMENT AND PLAN:   1. Chest pain with somewhat atypical features but abnormal EKG - symptoms are mostly atypical but she does have cardiac risk factors including HTN, HLD, and family history. EKG also showed changes compared to prior. At this time the patient is insistent that she does not want to pursue an invasive strategy and prefers noninvasive testing. If she rules out, this is reasonable. Will proceed with nuclear stress test in AM. She wants to try exercising. Cycle troponins. Add heparin if troponins turn positive. Continue ASA.   2. Essential HTN - continue home regimen and follow pressure.  3. Hyperlipidemia - check lipids. Depending on outcome of stress test, will consider  adding statin.  4. Hypothyroidism - continue levothyroxine. Recheck TSH.  5. Possible CKD stage II-III - based on GFR/age/weight. Avoid NSAIDs.  SignedRonie Spies PA-C 09/05/2015, 5:15 PM Pager: [phone]  Patient seen and examined and history reviewed. Agree with above findings and plan. Very pleasant 81 yo WF with history of HTN, HL-diet managed, and family history of CAD presents for evaluation of chest pain. Pain began yesterday, intermittent, lasting seconds. No associated N/V,diaphoresis, or SOB. Never had this CP before. Now pain free. Initial Ecg showed subtle ST abnormality. Initial troponin is negative. Patient still lives at home with husband and is active-doing all her own housework. We will plan to observe overnight. Check serial troponins. Plan myoview study in am to assess cardiac risk. Will hold off on IV heparin for now. If stress low risk can DC tomorrow.   Peter [place], MDFACC 09/05/2015 5:56 PM

## 2015-09-05 NOTE — ED Notes (Signed)
Family at bedside and Pt. Reports she wants to go home.  Pt. Was given a warm blanket by EMT John.

## 2015-09-05 NOTE — ED Notes (Signed)
Pt refused NS bolus states she want water to drink instead. Pa aware. Cup of ice water given.

## 2015-09-05 NOTE — ED Provider Notes (Signed)
Medical screening examination/treatment/procedure(s) were conducted as a shared visit with non-physician practitioner(s) and myself.  I personally evaluated the patient during the encounter.  A 81-year-old female here with substernal chest discomfort worse with exertion but also sometimes at rest. This is new over last 24 hours. Does not feel like previous episodes of reflux. EKG shows ST depressions in inferior leads as well as elevation in aVR. Patient's symptoms returned when she had her walk to the bathroom here. Initially patient was wanting to go home however patient was convinced to stay. Concern for her is that she has active ischemia causing her symptoms with EKG changes and relation to exertion so will be admitted for further workup from that standpoint.   EKG Interpretation   Date/Time:  Monday September 05 2015 10:33:57 EDT Ventricular Rate:  78 PR Interval:  154 QRS Duration: 80 QT Interval:  392 QTC Calculation: 446 R Axis:   67 Text Interpretation:  Normal sinus rhythm Nonspecific ST abnormality  Abnormal ECG Confirmed by [facility] LL[facility]ES[facility] MD, Barbara CowerJASON [phone]([id number]) on 09/05/2015 10:42:34  AM        Marily MemosJason Nancyann Cotterman, MD 09/06/15 [phone]

## 2015-09-05 NOTE — ED Notes (Signed)
PA at bedside.

## 2015-09-05 NOTE — ED Notes (Signed)
Pt refused protonix. PA aware. Pt states she does not need it.

## 2015-09-05 NOTE — ED Notes (Signed)
Report given to Partridge HouseGwen RN on [facility].

## 2015-09-05 NOTE — ED Notes (Signed)
[facility] with [facility].

## 2015-09-06 ENCOUNTER — Encounter (HOSPITAL_COMMUNITY)
Admission: EM | Disposition: A | Discharge status: Home / Self Care | Payer: Self-pay | Source: Home / Self Care | Attending: Cardiology

## 2015-09-06 ENCOUNTER — Observation Stay (HOSPITAL_COMMUNITY): Payer: Medicare Other

## 2015-09-06 ENCOUNTER — Encounter (HOSPITAL_COMMUNITY): Payer: Medicare Other

## 2015-09-06 ENCOUNTER — Other Ambulatory Visit (HOSPITAL_COMMUNITY): Payer: Medicare Other

## 2015-09-06 DIAGNOSIS — N183 Chronic kidney disease, stage 3 (moderate): Secondary | ICD-10-CM

## 2015-09-06 DIAGNOSIS — I129 Hypertensive chronic kidney disease with stage 1 through stage 4 chronic kidney disease, or unspecified chronic kidney disease: Secondary | ICD-10-CM | POA: Diagnosis not present

## 2015-09-06 DIAGNOSIS — E785 Hyperlipidemia, unspecified: Secondary | ICD-10-CM | POA: Diagnosis not present

## 2015-09-06 DIAGNOSIS — I10 Essential (primary) hypertension: Secondary | ICD-10-CM | POA: Diagnosis not present

## 2015-09-06 DIAGNOSIS — R072 Precordial pain: Secondary | ICD-10-CM | POA: Diagnosis not present

## 2015-09-06 DIAGNOSIS — R9431 Abnormal electrocardiogram [ECG] [EKG]: Secondary | ICD-10-CM | POA: Diagnosis not present

## 2015-09-06 DIAGNOSIS — R079 Chest pain, unspecified: Secondary | ICD-10-CM

## 2015-09-06 DIAGNOSIS — E039 Hypothyroidism, unspecified: Secondary | ICD-10-CM | POA: Diagnosis not present

## 2015-09-06 LAB — NM MYOCAR MULTI W/SPECT W/WALL MOTION / EF
CSEPED: 0 min
CSEPPHR: 121 {beats}/min
Estimated workload: 1 METS
Exercise duration (sec): 0 s
MPHR: 134 {beats}/min
Percent HR: 90 %
Rest HR: 76 {beats}/min

## 2015-09-06 LAB — TROPONIN I
Troponin I: <0.03 ng/mL (ref <0.031)
Troponin I: <0.03 ng/mL (ref <0.031)

## 2015-09-06 LAB — LIPID PANEL
Cholesterol: 185 mg/dL (ref 0–200)
HDL: 60 mg/dL (ref >40)
LDL Cholesterol: 106 mg/dL — ABNORMAL HIGH (ref 0–99)
Total CHOL/HDL Ratio: 3.1 RATIO
Triglycerides: 95 mg/dL (ref <150)
VLDL: 19 mg/dL (ref 0–40)

## 2015-09-06 LAB — BASIC METABOLIC PANEL
ANION GAP: 9 (ref 5–15)
BUN: 22 mg/dL — ABNORMAL HIGH (ref 6–20)
CHLORIDE: 103 mmol/L (ref 101–111)
CO2: 25 mmol/L (ref 22–32)
Calcium: 9.4 mg/dL (ref 8.9–10.3)
Creatinine, Ser: 1.12 mg/dL — ABNORMAL HIGH (ref 0.44–1.00)
GFR calc Af Amer: 50 mL/min — ABNORMAL LOW (ref >60)
GFR, EST NON AFRICAN AMERICAN: 43 mL/min — AB (ref >60)
GLUCOSE: 100 mg/dL — AB (ref 65–99)
POTASSIUM: 4.8 mmol/L (ref 3.5–5.1)
Sodium: 137 mmol/L (ref 135–145)

## 2015-09-06 LAB — CBC
HEMATOCRIT: 44 % (ref 36.0–46.0)
HEMOGLOBIN: 14.3 g/dL (ref 12.0–15.0)
MCH: 29.9 pg (ref 26.0–34.0)
MCHC: 32.5 g/dL (ref 30.0–36.0)
MCV: 92.1 fL (ref 78.0–100.0)
PLATELETS: 184 10*3/uL (ref 150–400)
RBC: 4.78 MIL/uL (ref 3.87–5.11)
RDW: 12.9 % (ref 11.5–15.5)
WBC: 9.6 10*3/uL (ref 4.0–10.5)

## 2015-09-06 SURGERY — LEFT HEART CATH AND CORONARY ANGIOGRAPHY
Anesthesia: LOCAL

## 2015-09-06 MED ORDER — NITROGLYCERIN 0.4 MG SL SUBL: 0.4000 mg | SUBLINGUAL_TABLET | SUBLINGUAL | Status: DC | PRN

## 2015-09-06 MED ORDER — TECHNETIUM TC 99M SESTAMIBI GENERIC - CARDIOLITE
30.0000 | Freq: Once | INTRAVENOUS | Status: AC | PRN
  Administered 2015-09-06: 30 via INTRAVENOUS

## 2015-09-06 MED ORDER — ASPIRIN 81 MG PO TBEC: 81.0000 mg | DELAYED_RELEASE_TABLET | Freq: Every day | ORAL | Status: DC

## 2015-09-06 MED ORDER — PANTOPRAZOLE SODIUM 40 MG PO TBEC: 40.0000 mg | DELAYED_RELEASE_TABLET | Freq: Every day | ORAL | Status: DC

## 2015-09-06 MED ORDER — TECHNETIUM TC 99M SESTAMIBI GENERIC - CARDIOLITE
10.0000 | Freq: Once | INTRAVENOUS | Status: AC | PRN
  Administered 2015-09-06: 10 via INTRAVENOUS

## 2015-09-06 MED ORDER — PANTOPRAZOLE SODIUM 40 MG PO TBEC
40.0000 mg | DELAYED_RELEASE_TABLET | Freq: Once | ORAL | Status: DC
Start: 2015-09-06 — End: 2015-09-06

## 2015-09-06 MED ORDER — REGADENOSON 0.4 MG/5ML IV SOLN
0.4000 mg | Freq: Once | INTRAVENOUS | Status: DC
  Filled 2015-09-06: qty 5

## 2015-09-06 MED ORDER — REGADENOSON 0.4 MG/5ML IV SOLN
INTRAVENOUS | Status: AC
  Filled 2015-09-06: qty 5

## 2015-09-06 NOTE — Progress Notes (Signed)
Rn has reviewed all discharge instructions with pt and family. Pt told to call [facility] with any questions regarding discharge. IV removed.

## 2015-09-06 NOTE — Progress Notes (Signed)
TELEMETRY: Reviewed telemetry pt in NSR: Filed Vitals:   09/05/15 1547 09/05/15 1703 09/05/15 2055 09/06/15 0546  BP: 152/62 163/99 126/53 168/65  Pulse: 71 71 74 87  Temp: 98.2 F (36.8 C) 97.9 F (36.6 C) 98.7 F (37.1 C) 97.9 F (36.6 C)  TempSrc:  Axillary Oral Oral  Resp: 20 18 18 18   Height:      Weight:    63.458 kg (139 lb 14.4 oz)  SpO2: 100% 100% 94% 97%    Intake/Output Summary (Last 24 hours) at 09/06/15 0914 Last data filed at 09/06/15 0826  Gross per 24 hour  Intake      0 ml  Output      0 ml  Net      0 ml   Filed Weights   09/05/15 1053 09/06/15 0546  Weight: 63.504 kg (140 lb) 63.458 kg (139 lb 14.4 oz)    Subjective Feels well this am. Anxious about stress test. No further chest pain.  Marland Kitchen. aspirin EC  81 mg Oral Daily  . heparin  5,000 Units Subcutaneous 3 times per day  . lisinopril  10 mg Oral Daily   And  . hydrochlorothiazide  12.5 mg Oral Daily  . levothyroxine  88 mcg Oral QAC breakfast  . pantoprazole  40 mg Oral Once  . sodium chloride  3 mL Intravenous Q12H      LABS: Basic Metabolic Panel:  Recent Labs  16/08/9609/24/16 1155 09/06/15 0517  NA 141 137  K 4.2 4.8  CL 109 103  CO2 26 25  GLUCOSE 105* 100*  BUN 23* 22*  CREATININE 1.06* 1.12*  CALCIUM 9.7 9.4   Liver Function Tests:  Recent Labs  09/05/15 1155  AST 21  ALT 21  ALKPHOS 68  BILITOT 0.6  PROT 7.5  ALBUMIN 4.2    Recent Labs  09/05/15 1155  LIPASE 37   CBC:  Recent Labs  09/05/15 1155 09/06/15 0517  WBC 8.0 9.6  HGB 15.5* 14.3  HCT 46.6* 44.0  MCV 90.1 92.1  PLT 120* 184   Cardiac Enzymes:  Recent Labs  09/05/15 1853 09/05/15 2356 09/06/15 0517  TROPONINI <0.03 <0.03 <0.03   BNP: No results for input(s): PROBNP in the last 72 hours. D-Dimer: No results for input(s): DDIMER in the last 72 hours. Hemoglobin A1C: No results for input(s): HGBA1C in the last 72 hours. Fasting Lipid Panel:  Recent Labs  09/06/15 0517  CHOL 185  HDL  60  LDLCALC 106*  TRIG 95  CHOLHDL 3.1   Thyroid Function Tests:  Recent Labs  09/05/15 1853  TSH 4.301     Radiology/Studies:  Dg Chest 2 View  09/05/2015  CLINICAL DATA:  One day history of chest pain EXAM: CHEST  2 VIEW COMPARISON:  None. FINDINGS: There is no edema or consolidation. The heart size and pulmonary vascularity are normal. No adenopathy. There is calcification in the mitral annulus. No pneumothorax. No bone lesions. IMPRESSION: No edema or consolidation. Electronically Signed   By: Bretta BangWilliam  Woodruff III M.D.   On: 09/05/2015 11:38    PHYSICAL EXAM General: Well developed, well nourished, in no acute distress. Head: Normocephalic, atraumatic, sclera non-icteric, oropharynx is clear Neck: Negative for carotid bruits. JVD not elevated. No adenopathy Lungs: Clear bilaterally to auscultation without wheezes, rales, or rhonchi. Breathing is unlabored. Heart: RRR S1 S2 without murmurs, rubs, or gallops.  Abdomen: Soft, non-tender, non-distended with normoactive bowel sounds. No hepatomegaly. No rebound/guarding. No obvious abdominal masses.  Msk:  Strength and tone appears normal for age. Extremities: No clubbing, cyanosis or edema.  Distal pedal pulses are 2+ and equal bilaterally. Neuro: Alert and oriented X 3. Moves all extremities spontaneously. Psych:  Responds to questions appropriately with a normal affect.  ASSESSMENT AND PLAN: 1. Chest pain. Ruled out for MI. Plan stress Myoview today. If low risk would treat conservatively and DC home. If high risk would need to consider cardiac cath.  2. HTN labile consider adding a low dose beta blocker after stress test 3. Hyperlipidemia. LDL 106. Diet managed. If high risk myoview would add a statin.  Present on Admission:  . Chest pain . Hypothyroidism . Essential hypertension  Signed, Damika Harmon [place], MDFACC 09/06/2015 9:14 AM

## 2015-09-06 NOTE — Discharge Summary (Addendum)
Discharge Summary   Patient ID: [name] [name],  MRN: [id number], DOB/AGE: 79/01/1927 79 y.o.  Admit date: 09/05/2015 Discharge date: 09/06/2015  Primary Care Provider: Loreen FreudYvonne Lowne Primary Cardiologist: new - Dr. SwazilandJordan  Discharge Diagnoses Principal Problem:   Chest pain Active Problems:   Hypothyroidism   Essential hypertension   Hyperlipidemia   CKD (chronic kidney disease), stage II-III   Abnormal EKG   Allergies No Known Allergies  Procedures  Lexiscan Myoview 09/06/2015 IMPRESSION: 1. No reversible ischemia or infarction.  2. Normal left ventricular wall motion.  3. Left ventricular ejection fraction 84% which is likely exaggerated and due in part to small left ventricular volume.  4. Low-risk stress test findings*.     Hospital Course  The patient is a 81 year old female with past medical history of hypertension, hypothyroidism, hyperlipidemia and a family history of heart disease but no personal history of cardiac issues. She never smoked. Her last echocardiogram in August 2016 showed moderate LVH, EF 60-65%, grade 1 diastolic dysfunction, high ventricular filling pressure, mild MR, mild left atrial enlargement, PAS be 36. She initially presented to medicine [place] on 09/05/2015 at the urging of her daughter for evaluation of chest pain. According to the patient, she had one-day onset of intermittent chest discomfort. Initial EKG had diffuse ST depression, therefore prompted to transfer to [facility] .  She was admitted to cardiac service, overnight her showed troponin was negative despite the EKG changes. She underwent Myoview in the morning of 09/06/2015 which came back low risk with EF 84%, no reversible ischemia or infarction. She is deemed stable for discharge from cardiology perspective. Changes to her current medications include admission of aspirin. I have also added Protonix to her medical regimen as a trial to see if it would prevent her  chest discomfort. I have also given her PRN nitroglycerin. I have discussed with the patient the ST depression, she wish to follow-up with Dr. SwazilandJordan on an as-needed bases since she leads a busy lifestyle. I have instructed her to contact her primary care physician to schedule a close follow-up within the next 2 weeks.    Discharge Vitals Blood pressure 130/65, pulse 91, temperature 97.6 F (36.4 C), temperature source Oral, resp. rate 18, height 5\' 4"  (1.626 m), weight 139 lb 14.4 oz (63.458 kg), SpO2 99 %.  Filed Weights   09/05/15 1053 09/06/15 0546  Weight: 140 lb (63.504 kg) 139 lb 14.4 oz (63.458 kg)    Labs  CBC  Recent Labs  09/05/15 1155 09/06/15 0517  WBC 8.0 9.6  HGB 15.5* 14.3  HCT 46.6* 44.0  MCV 90.1 92.1  PLT 120* 184   Basic Metabolic Panel  Recent Labs  09/05/15 1155 09/06/15 0517  NA 141 137  K 4.2 4.8  CL 109 103  CO2 26 25  GLUCOSE 105* 100*  BUN 23* 22*  CREATININE 1.06* 1.12*  CALCIUM 9.7 9.4   Liver Function Tests  Recent Labs  09/05/15 1155  AST 21  ALT 21  ALKPHOS 68  BILITOT 0.6  PROT 7.5  ALBUMIN 4.2    Recent Labs  09/05/15 1155  LIPASE 37   Cardiac Enzymes  Recent Labs  09/05/15 1853 09/05/15 2356 09/06/15 0517  TROPONINI <0.03 <0.03 <0.03   Fasting Lipid Panel  Recent Labs  09/06/15 0517  CHOL 185  HDL 60  LDLCALC 106*  TRIG 95  CHOLHDL 3.1   Thyroid Function Tests  Recent Labs  09/05/15 1853  TSH 4.301  Disposition  Pt is being discharged home today in good condition.  Follow-up Plans & Appointments      Follow-up Information    Follow up with Loreen Freud, DO. Schedule an appointment as soon as possible for a visit in 2 weeks.   Specialty:  Family Medicine   Contact information:   [address] [place] [address] [phone]       Follow up with Anica Alcaraz Swaziland, MD.   Specialty:  Cardiology   Why:  please call cardiology as needed   Contact information:   [address] [place] [address] [phone]       Discharge Medications    Medication List    TAKE these medications        aspirin 81 MG EC tablet  Take 1 tablet (81 mg total) by mouth daily.     CALTRATE 600 1500 (600 CA) MG Tabs tablet  Generic drug:  calcium carbonate  Take 1 tablet by mouth daily.     ICAPS AREDS 2 PO  Take 1 tablet by mouth daily.     levothyroxine 88 MCG tablet  Commonly known as:  SYNTHROID, LEVOTHROID  Take 1 tablet (88 mcg total) by mouth daily.     lisinopril-hydrochlorothiazide 10-12.5 MG tablet  Commonly known as:  PRINZIDE,ZESTORETIC  Take 1 tablet by mouth daily.     nitroGLYCERIN 0.4 MG SL tablet  Commonly known as:  NITROSTAT  Place 1 tablet (0.4 mg total) under the tongue every 5 (five) minutes x 3 doses as needed for chest pain.     pantoprazole 40 MG tablet  Commonly known as:  PROTONIX  Take 1 tablet (40 mg total) by mouth daily.     sodium-potassium bicarbonate Tbef dissolvable tablet  Commonly known as:  ALKA-SELTZER GOLD  Take 1 tablet by mouth daily as needed (indigestion).        Duration of Discharge Encounter   Greater than 30 minutes including physician time.  Ramond Dial PA-C Pager: [phone] 09/06/2015, 4:23 PM   Patient seen and examined and history reviewed. Agree with above findings and plan. myoview study is normal. Recommend conservative management with follow up with PCP  Anays Detore Swaziland, MDFACC 09/06/2015 5:15 PM

## 2015-09-06 NOTE — Discharge Instructions (Signed)

## 2015-09-06 NOTE — Progress Notes (Signed)
1 day lexiscan myoview completed without significant complication, pending [facility].  Ramond DialSigned, Maanvi Lecompte PA Pager: [phone]

## 2015-09-07 ENCOUNTER — Telehealth: Payer: Self-pay | Admitting: Cardiology

## 2015-09-07 ENCOUNTER — Telehealth: Payer: Self-pay | Admitting: Behavioral Health

## 2015-09-07 NOTE — Telephone Encounter (Signed)
D/C phone call. Appt is on 09/08/15 at 1:30 w/Dana Dunn at the [facility] [facility] .Marland Kitchen.  Thanks

## 2015-09-07 NOTE — Telephone Encounter (Signed)
Transition Care Management Follow-up Telephone Call   Date discharged? 09/06/15   How have you been since you were released from the hospital? Patient states, "I'm doing just fine and not having any severe pain".   Do you understand why you were in the hospital? yes   Do you understand the discharge instructions? yes   Where were you discharged to? Home with husband.   Items Reviewed:  Medications reviewed: yes  Allergies reviewed: yes  Dietary changes reviewed: yes, pt. reports no changes in diet.  Referrals reviewed: yes, patient voiced that the cardiologist office has already followed up with her via phone.   Functional Questionnaire:   Activities of Daily Living (ADLs):   She states they are independent in the following: ambulation, bathing and hygiene, feeding, continence, grooming, toileting and dressing States they require assistance with the following: None   Any transportation issues/concerns?: no   Any patient concerns? no   Confirmed importance and date/time of follow-up visits scheduled: Patient verbalized that she does not want to schedule a follow-up visit at this time.   Confirmed with patient if condition begins to worsen call PCP or go to the ER.  Patient was given the office number and encouraged to call back with question or concerns.  : yes

## 2015-09-07 NOTE — Telephone Encounter (Signed)
Patient was seen by Dr. Jordan.

## 2015-09-07 NOTE — Telephone Encounter (Signed)
Patient contacted regarding discharge from [facility] on 09/06/2015.  Patient understands to follow up with provider - she does not follow up - appt cancelled  Patient understands discharge instructions? yes Patient understands medications and regiment? yes Patient understands to bring all medications to this visit? n/a

## 2015-09-08 ENCOUNTER — Ambulatory Visit: Payer: Medicare Other | Admitting: Physician Assistant

## 2015-09-09 SURGERY — LEFT HEART CATH AND CORONARY ANGIOGRAPHY
Anesthesia: LOCAL

## 2015-09-19 LAB — HM MAMMOGRAPHY: HM Mammogram: NEGATIVE

## 2015-10-04 ENCOUNTER — Other Ambulatory Visit: Payer: Self-pay

## 2015-10-04 DIAGNOSIS — E039 Hypothyroidism, unspecified: Secondary | ICD-10-CM

## 2015-10-04 MED ORDER — LEVOTHYROXINE SODIUM 88 MCG PO TABS: 88.0000 ug | ORAL_TABLET | Freq: Every day | ORAL | Status: DC

## 2015-12-21 DIAGNOSIS — H903 Sensorineural hearing loss, bilateral: Secondary | ICD-10-CM | POA: Diagnosis not present

## 2015-12-21 DIAGNOSIS — H9313 Tinnitus, bilateral: Secondary | ICD-10-CM | POA: Diagnosis not present

## 2015-12-21 DIAGNOSIS — H6123 Impacted cerumen, bilateral: Secondary | ICD-10-CM | POA: Diagnosis not present

## 2016-05-01 ENCOUNTER — Other Ambulatory Visit: Payer: Self-pay

## 2016-05-01 DIAGNOSIS — E039 Hypothyroidism, unspecified: Secondary | ICD-10-CM

## 2016-05-01 MED ORDER — LEVOTHYROXINE SODIUM 88 MCG PO TABS: 88.0000 ug | ORAL_TABLET | Freq: Every day | ORAL | Status: DC

## 2016-05-07 DIAGNOSIS — M25562 Pain in left knee: Secondary | ICD-10-CM | POA: Diagnosis not present

## 2016-05-07 DIAGNOSIS — M79605 Pain in left leg: Secondary | ICD-10-CM | POA: Diagnosis not present

## 2016-07-27 DIAGNOSIS — H5213 Myopia, bilateral: Secondary | ICD-10-CM | POA: Diagnosis not present

## 2016-08-13 ENCOUNTER — Ambulatory Visit: Payer: Medicare Other | Admitting: *Deleted

## 2016-08-21 ENCOUNTER — Telehealth: Payer: Self-pay | Admitting: Family Medicine

## 2016-08-21 MED ORDER — LEVOTHYROXINE SODIUM 88 MCG PO TABS: 88.0000 ug | ORAL_TABLET | Freq: Every day | ORAL | 0 refills | Status: DC

## 2016-08-21 NOTE — Telephone Encounter (Signed)
Caller name: Relationship to patient: Self Can be reached: Pharmacy: [facility] - [place], [place] - [address] [phone] (Phone) [phone] (Fax)    Reason for call: Refill on levothyroxine (SYNTHROID, LEVOTHROID) 88 MCG tablet [[id number]][[id number]]

## 2016-08-21 NOTE — Telephone Encounter (Signed)
She is overdue for an apt, please Schedule/Fasting. Rx sent.   KP

## 2016-08-27 NOTE — Telephone Encounter (Signed)
Pt has been scheduled.  °

## 2016-09-05 ENCOUNTER — Ambulatory Visit: Payer: Medicare Other | Admitting: *Deleted

## 2016-09-14 ENCOUNTER — Ambulatory Visit (INDEPENDENT_AMBULATORY_CARE_PROVIDER_SITE_OTHER): Payer: Medicare Other | Admitting: *Deleted

## 2016-09-14 ENCOUNTER — Encounter: Payer: Self-pay | Admitting: *Deleted

## 2016-09-14 VITALS — BP 132/66 | HR 63 | Resp 16 | Ht 63.5 in | Wt 140.2 lb

## 2016-09-14 DIAGNOSIS — I10 Essential (primary) hypertension: Secondary | ICD-10-CM | POA: Diagnosis not present

## 2016-09-14 DIAGNOSIS — M810 Age-related osteoporosis without current pathological fracture: Secondary | ICD-10-CM

## 2016-09-14 DIAGNOSIS — Z0000 Encounter for general adult medical examination without abnormal findings: Secondary | ICD-10-CM

## 2016-09-14 NOTE — Patient Instructions (Signed)
Follow-up with Dr. Zola ButtonLowne-Chase as scheduled.  Schedule an appointment with your hearing specialist.  Bring a copy of your advanced directives to your next office visit.  Advance Directive Advance directives are the legal documents that allow you to make choices about your health care and medical treatment if you cannot speak for yourself. Advance directives are a way for you to communicate your wishes to family, friends, and health care providers. The specified people can then convey your decisions about end-of-life care to avoid confusion if you should become unable to communicate. Ideally, the process of discussing and writing advance directives should happen over time rather than making decisions all at once. Advance directives can be modified as your situation changes, and you can change your mind at any time, even after you have signed the advance directives. Each state has its own laws regarding advance directives. You may want to check with your health care provider, attorney, or state representative about the law in your state. Below are some examples of advance directives. LIVING WILL A living will is a set of instructions documenting your wishes about medical care when you cannot care for yourself. It is used if you become:  Terminally ill.  Incapacitated.  Unable to communicate.  Unable to make decisions. Items to consider in your living will include:  The use or non-use of life-sustaining equipment, such as dialysis machines and breathing machines (ventilators).  A do not resuscitate (DNR) order, which is the instruction not to use cardiopulmonary resuscitation (CPR) if breathing or heartbeat stops.  Tube feeding.  Withholding of food and fluids.  Comfort (palliative) care when the goal becomes comfort rather than a cure.  Organ and tissue donation. A living will does not give instructions about distribution of your money and property if you should pass away. It is  advisable to seek the expert advice of a lawyer in drawing up a will regarding your possessions. Decisions about taxes, beneficiaries, and asset distribution will be legally binding. This process can relieve your family and friends of any burdens surrounding disputes or questions that may come up about the allocation of your assets. DO NOT RESUSCITATE (DNR) A do not resuscitate (DNR) order is a request to not have CPR in the event that your heart stops beating or you stop breathing. Unless given other instructions, a health care provider will try to help any patient whose heart has stopped or who has stopped breathing.  HEALTH CARE PROXY AND DURABLE POWER OF ATTORNEY FOR HEALTH CARE A health care proxy is a person (agent) appointed to make medical decisions for you if you cannot. Generally, people choose someone they know well and trust to represent their preferences when they can no longer do so. You should be sure to ask this person for agreement to act as your agent. An agent may have to exercise judgment in the event of a medical decision for which your wishes are not known. The durable power of attorney for health care is the legal document that names your health care proxy. Once written, it should be:  Signed.  Notarized.  Dated.  Copied.  Witnessed.  Incorporated into your medical record. You may also want to appoint someone to manage your financial affairs if you cannot. This is called a durable power of attorney for finances. It is a separate legal document from the durable power of attorney for health care. You may choose the same person or someone different from your health care proxy to act as  your agent in financial matters.   This information is not intended to replace advice given to you by your health care provider. Make sure you discuss any questions you have with your health care provider.   Document Released: 02/05/2008 Document Revised: 11/03/2013 Document Reviewed:  03/18/2013 Elsevier Interactive Patient Education [employer]2016 Elsevier Inc.

## 2016-09-14 NOTE — Assessment & Plan Note (Signed)
Pt reports compliance w/ medication. BP well controlled today. Follow-up w/ PCP as scheduled.

## 2016-09-14 NOTE — Progress Notes (Signed)
Subjective:   [name] [name] is a 82 y.o. female who presents for Medicare Annual (Subsequent) preventive examination.  Review of Systems:  No ROS.  Medicare Wellness Visit.  Cardiac Risk Factors include: advanced age (>4455men, 44>65 women);hypertension  Sleep patterns: No sleep issues. 7 hrs nightly. Gets up 1-2x nightly to void.   Home Safety/Smoke Alarms: Lives in home w/ husband. Feels safe in home. Able Living environment; residence and Firearm Safety: No firearms. Seat Belt Safety/Bike Helmet: Wears seat belt.    Counseling:   Eye Exam- Dr. Hazle Quantigby yearly. Dental[place]  Female:  Pap- N/A due to age       Mammo- 09/19/15 BI-RADS Category 1: Negative.         Dexa scan- Not on file. Pt states last was several years ago, declines screening at this time.       CCS- Never, no routine screening screening at this time due to pt's age       Objective:     Vitals: BP 132/66 (BP Location: Left Arm, Patient Position: Sitting, Cuff Size: Normal)   Pulse 63   Resp 16   Ht 5' 3.5" (1.613 m)   Wt 140 lb 3.2 oz (63.6 kg)   SpO2 98%   BMI 24.45 kg/m   Body mass index is 24.45 kg/m.   Tobacco History  Smoking Status  . Never Smoker  Smokeless Tobacco  . Never Used     Counseling given: Not Answered   Past Medical History:  Diagnosis Date  . Arthritis    "right thumb" (09/05/2015)  . Hyperlipidemia   . Hypertension   . Hypothyroidism   . Osteoporosis    Past Surgical History:  Procedure Laterality Date  . CATARACT EXTRACTION W/ INTRAOCULAR LENS  IMPLANT, BILATERAL Bilateral 2010  . CLOSED REDUCTION SHOULDER DISLOCATION Right   . OVARIAN CYST SURGERY  1949  . TONSILLECTOMY     Family History  Problem Relation Age of Onset  . Cancer Mother     bladder  . Cancer Father     unknown  . Ovarian cancer Sister   . Heart disease Brother     All 5 brothers have had heart issues starting in their 4370s  . Breast cancer Daughter    History  Sexual  Activity  . Sexual activity: Yes  . Partners: Male    Outpatient Encounter Prescriptions as of 09/14/2016  Medication Sig  . aspirin EC 81 MG EC tablet Take 1 tablet (81 mg total) by mouth daily.  . Calcium Carbonate (CALTRATE 600) 1500 MG TABS Take 1 tablet by mouth daily.   Marland Kitchen. levothyroxine (SYNTHROID, LEVOTHROID) 88 MCG tablet Take 1 tablet (88 mcg total) by mouth daily.  Marland Kitchen. lisinopril-hydrochlorothiazide (PRINZIDE,ZESTORETIC) 10-12.5 MG per tablet Take 1 tablet by mouth daily.  . Multiple Vitamins-Minerals (ICAPS AREDS 2 PO) Take 1 tablet by mouth daily.  . nitroGLYCERIN (NITROSTAT) 0.4 MG SL tablet Place 1 tablet (0.4 mg total) under the tongue every 5 (five) minutes x 3 doses as needed for chest pain.  . pantoprazole (PROTONIX) 40 MG tablet Take 1 tablet (40 mg total) by mouth daily.  . sodium-potassium bicarbonate (ALKA-SELTZER GOLD) TBEF dissolvable tablet Take 1 tablet by mouth daily as needed (indigestion).   No facility-administered encounter medications on file as of 09/14/2016.     Activities of Daily Living In your present state of health, do you have any difficulty performing the following activities: 09/14/2016  Hearing? Y  Vision?  N  Difficulty concentrating or making decisions? N  Walking or climbing stairs? N  Dressing or bathing? N  Doing errands, shopping? N  Preparing Food and eating ? N  Using the Toilet? N  In the past six months, have you accidently leaked urine? N  Do you have problems with loss of bowel control? N  Managing your Medications? N  Managing your Finances? N  Housekeeping or managing your Housekeeping? N  Some recent data might be hidden    Patient Care Team: Donato SchultzYvonne R Lowne Chase, DO as PCP - General    Assessment:    Physical assessment deferred to PCP.  Exercise Activities and Dietary recommendations Current Exercise Habits: The patient does not participate in regular exercise at present  Diet (meal preparation, eat out, water intake,  caffeinated beverages, dairy products, fruits and vegetables): in general, a "healthy" diet  , well balanced, on average, 3 meals per day. Drinks 4-5 glasses of water daily. Able to prepare meals. Combination of eating out and eating at home.  Goals    . Healthy Lifestyle          Continue to eat heart healthy diet (full of fruits, vegetables, whole grains, lean protein, water--limit salt, fat, and sugar intake) and increase physical activity as tolerated. Continue doing brain stimulating activities (puzzles, reading, adult coloring books, staying active) to keep memory sharp.        Fall Risk Fall Risk  09/14/2016 06/14/2015 03/04/2014 10/10/2012  Falls in the past year? No No No No   Depression Screen PHQ 2/9 Scores 09/14/2016 06/14/2015 03/04/2014 10/10/2012  PHQ - 2 Score 0 0 0 0     Cognitive Function MMSE - Mini Mental State Exam 09/14/2016  Orientation to time 5  Orientation to Place 5  Registration 3  Attention/ Calculation 5  Recall 3  Language- name 2 objects 2  Language- repeat 1  Language- follow 3 step command 3  Language- read & follow direction 1  Write a sentence 1  Copy design 1  Total score 30         There is no immunization history on file for this patient. Screening Tests Health Maintenance  Topic Date Due  . TETANUS/TDAP  11/15/1947  . ZOSTAVAX  11/14/1988  . DEXA SCAN  11/14/1993  . INFLUENZA VACCINE  02/09/2017 (Originally 06/12/2016)  . PNA vac Low Risk Adult (1 of 2 - PCV13) 03/14/2017 (Originally 11/14/1993)      Plan:    Follow-up with Dr. Zola ButtonLowne-Chase as scheduled.  Schedule an appointment with your hearing specialist.  Bring a copy of your advanced directives to your next office visit.  Declines all immunizations today, states she prefers to get flu and pneumococcal at the pharmacy.   During the course of the visit the patient was educated and counseled about the following appropriate screening and preventive services:   Vaccines to include  Pneumoccal, Influenza, Hepatitis B, Td, Zostavax, HCV  Cardiovascular Disease  Colorectal cancer screening  Bone density screening  Diabetes screening  Glaucoma screening  Mammography/PAP  Nutrition counseling   Patient Instructions (the written plan) was given to the patient.   [name] Linkarolyn J Xxavier Noon, RN  09/14/2016

## 2016-09-14 NOTE — Progress Notes (Signed)
Pre visit review using our clinic review tool, if applicable. No additional management support is needed unless otherwise documented below in the visit note. 

## 2016-09-14 NOTE — Assessment & Plan Note (Signed)
Previously on Fosamax for 8 years. Currently taking calcium + vitamin D supplement. Encouraged weight bearing exercise as tolerated. Pt declines follow-up DEXA scan today.

## 2016-09-14 NOTE — Progress Notes (Signed)
reviewed

## 2016-09-17 ENCOUNTER — Other Ambulatory Visit: Payer: Self-pay

## 2016-09-17 DIAGNOSIS — I10 Essential (primary) hypertension: Secondary | ICD-10-CM

## 2016-09-17 MED ORDER — LISINOPRIL-HYDROCHLOROTHIAZIDE 10-12.5 MG PO TABS: 1.0000 | ORAL_TABLET | Freq: Every day | ORAL | 0 refills | Status: DC

## 2016-09-18 NOTE — Telephone Encounter (Signed)
Patient's husband is calling because the patient has still not gotten her refill of thyroid medication. She is scheduled for 10/02/16. He states that she has had appts scheduled before then but they got canceled. Please advise.

## 2016-09-18 NOTE — Telephone Encounter (Signed)
Last ov 06/14/15. Last T4 lab 09/05/15. Please advise.

## 2016-09-19 ENCOUNTER — Other Ambulatory Visit: Payer: Self-pay | Admitting: Family Medicine

## 2016-09-19 MED ORDER — LEVOTHYROXINE SODIUM 88 MCG PO TABS: 88.0000 ug | ORAL_TABLET | Freq: Every day | ORAL | 0 refills | Status: DC

## 2016-09-19 NOTE — Telephone Encounter (Signed)
I sent it to pharmacy on file

## 2016-10-02 ENCOUNTER — Encounter: Payer: Self-pay | Admitting: Family Medicine

## 2016-10-02 ENCOUNTER — Ambulatory Visit (INDEPENDENT_AMBULATORY_CARE_PROVIDER_SITE_OTHER): Payer: Medicare Other | Admitting: Family Medicine

## 2016-10-02 ENCOUNTER — Other Ambulatory Visit: Payer: Self-pay | Admitting: Family Medicine

## 2016-10-02 ENCOUNTER — Other Ambulatory Visit: Payer: Self-pay

## 2016-10-02 VITALS — BP 142/68 | HR 66 | Temp 97.8°F | Resp 16

## 2016-10-02 DIAGNOSIS — R079 Chest pain, unspecified: Secondary | ICD-10-CM

## 2016-10-02 DIAGNOSIS — R319 Hematuria, unspecified: Secondary | ICD-10-CM

## 2016-10-02 DIAGNOSIS — E039 Hypothyroidism, unspecified: Secondary | ICD-10-CM | POA: Diagnosis not present

## 2016-10-02 DIAGNOSIS — I10 Essential (primary) hypertension: Secondary | ICD-10-CM

## 2016-10-02 LAB — CBC WITH DIFFERENTIAL/PLATELET
Basophils Absolute: 0 10*3/uL (ref 0.0–0.1)
Basophils Relative: 0.5 % (ref 0.0–3.0)
EOS PCT: 1.4 % (ref 0.0–5.0)
Eosinophils Absolute: 0.1 10*3/uL (ref 0.0–0.7)
HCT: 44.6 % (ref 36.0–46.0)
Hemoglobin: 15 g/dL (ref 12.0–15.0)
LYMPHS ABS: 2 10*3/uL (ref 0.7–4.0)
Lymphocytes Relative: 23.7 % (ref 12.0–46.0)
MCHC: 33.6 g/dL (ref 30.0–36.0)
MCV: 90.3 fl (ref 78.0–100.0)
MONO ABS: 0.6 10*3/uL (ref 0.1–1.0)
Monocytes Relative: 7.6 % (ref 3.0–12.0)
NEUTROS ABS: 5.6 10*3/uL (ref 1.4–7.7)
NEUTROS PCT: 66.8 % (ref 43.0–77.0)
PLATELETS: 194 10*3/uL (ref 150.0–400.0)
RBC: 4.94 Mil/uL (ref 3.87–5.11)
RDW: 13.3 % (ref 11.5–15.5)
WBC: 8.3 10*3/uL (ref 4.0–10.5)

## 2016-10-02 LAB — POCT URINALYSIS DIPSTICK
BILIRUBIN UA: NEGATIVE
Glucose, UA: NEGATIVE
KETONES UA: NEGATIVE
LEUKOCYTES UA: NEGATIVE
Nitrite, UA: NEGATIVE
PH UA: 5.5
PROTEIN UA: NEGATIVE
Urobilinogen, UA: 0.2

## 2016-10-02 LAB — LIPID PANEL
Cholesterol: 215 mg/dL — ABNORMAL HIGH (ref 0–200)
HDL: 73.2 mg/dL (ref >39.00)
LDL Cholesterol: 123 mg/dL — ABNORMAL HIGH (ref 0–99)
NONHDL: 141.92
TRIGLYCERIDES: 95 mg/dL (ref 0.0–149.0)
Total CHOL/HDL Ratio: 3
VLDL: 19 mg/dL (ref 0.0–40.0)

## 2016-10-02 LAB — COMPREHENSIVE METABOLIC PANEL
ALK PHOS: 72 U/L (ref 39–117)
ALT: 20 U/L (ref 0–35)
AST: 18 U/L (ref 0–37)
Albumin: 4.2 g/dL (ref 3.5–5.2)
BUN: 24 mg/dL — ABNORMAL HIGH (ref 6–23)
CALCIUM: 10.1 mg/dL (ref 8.4–10.5)
CO2: 26 meq/L (ref 19–32)
Chloride: 105 mEq/L (ref 96–112)
Creatinine, Ser: 0.96 mg/dL (ref 0.40–1.20)
GFR: 58.31 mL/min — AB (ref >60.00)
GLUCOSE: 95 mg/dL (ref 70–99)
POTASSIUM: 3.9 meq/L (ref 3.5–5.1)
Sodium: 140 mEq/L (ref 135–145)
TOTAL PROTEIN: 7.3 g/dL (ref 6.0–8.3)
Total Bilirubin: 0.6 mg/dL (ref 0.2–1.2)

## 2016-10-02 LAB — TSH: TSH: 3.63 u[IU]/mL (ref 0.35–4.50)

## 2016-10-02 MED ORDER — LEVOTHYROXINE SODIUM 88 MCG PO TABS: 88.0000 ug | ORAL_TABLET | Freq: Every day | ORAL | 1 refills | Status: DC

## 2016-10-02 MED ORDER — LISINOPRIL 20 MG PO TABS: 20.0000 mg | ORAL_TABLET | Freq: Every day | ORAL | 3 refills | Status: DC

## 2016-10-02 MED ORDER — LISINOPRIL-HYDROCHLOROTHIAZIDE 10-12.5 MG PO TABS: 1.0000 | ORAL_TABLET | Freq: Every day | ORAL | 1 refills | Status: DC

## 2016-10-02 NOTE — Assessment & Plan Note (Signed)
Check labs con't synthroid 

## 2016-10-02 NOTE — Progress Notes (Signed)
Patient ID: [name] [name], female    DOB: Nov 02, 1927  Age: 82 y.o. MRN: [id number]    Subjective:  Subjective  HPI [name] [name] presents for f/u cholesterol , bp and thyroid.    Review of Systems  Constitutional: Positive for fatigue. Negative for appetite change, diaphoresis and unexpected weight change.  Eyes: Negative for pain, redness and visual disturbance.  Respiratory: Negative for cough, chest tightness, shortness of breath and wheezing.   Cardiovascular: Negative for chest pain, palpitations and leg swelling.  Endocrine: Negative for cold intolerance, heat intolerance, polydipsia, polyphagia and polyuria.  Genitourinary: Negative for difficulty urinating, dysuria and frequency.  Neurological: Negative for dizziness, light-headedness, numbness and headaches.    History Past Medical History:  Diagnosis Date  . Arthritis    "right thumb" (09/05/2015)  . Hyperlipidemia   . Hypertension   . Hypothyroidism   . Osteoporosis     She has a past surgical history that includes Tonsillectomy; Ovarian cyst surgery (1949); Cataract extraction w/ intraocular lens  implant, bilateral (Bilateral, 2010); and Closed reduction shoulder dislocation (Right).   Her family history includes Breast cancer in her daughter; Cancer in her father and mother; Heart disease in her brother; Ovarian cancer in her sister.She reports that she has never smoked. She has never used smokeless tobacco. She reports that she does not drink alcohol or use drugs.  Current Outpatient Prescriptions on File Prior to Visit  Medication Sig Dispense Refill  . Calcium Carbonate (CALTRATE 600) 1500 MG TABS Take 1 tablet by mouth daily.     . Multiple Vitamins-Minerals (ICAPS AREDS 2 PO) Take 1 tablet by mouth daily.    . pantoprazole (PROTONIX) 40 MG tablet Take 1 tablet (40 mg total) by mouth daily. 30 tablet 5  . aspirin EC 81 MG EC tablet Take 1 tablet (81 mg total) by mouth daily. (Patient not taking: Reported on  10/02/2016)    . nitroGLYCERIN (NITROSTAT) 0.4 MG SL tablet Place 1 tablet (0.4 mg total) under the tongue every 5 (five) minutes x 3 doses as needed for chest pain. (Patient not taking: Reported on 10/02/2016) 25 tablet 3  . sodium-potassium bicarbonate (ALKA-SELTZER GOLD) TBEF dissolvable tablet Take 1 tablet by mouth daily as needed (indigestion).     No current facility-administered medications on file prior to visit.      Objective:  Objective  Physical Exam  Constitutional: She is oriented to person, place, and time. She appears well-developed and well-nourished.  HENT:  Head: Normocephalic and atraumatic.  Eyes: Conjunctivae and EOM are normal.  Neck: Normal range of motion. Neck supple. No JVD present. Carotid bruit is not present. No thyromegaly present.  Cardiovascular: Normal rate, regular rhythm and normal heart sounds.   No murmur heard. Pulmonary/Chest: Effort normal and breath sounds normal. No respiratory distress. She has no wheezes. She has no rales. She exhibits no tenderness.  Musculoskeletal: She exhibits no edema.  Neurological: She is alert and oriented to person, place, and time.  Psychiatric: She has a normal mood and affect.  Nursing note reviewed.  BP (!) 142/68 (BP Location: Right Arm, Patient Position: Sitting, Cuff Size: Normal)   Pulse 66   Temp 97.8 F (36.6 C) (Oral)   Resp 16   SpO2 99%  Wt Readings from Last 3 Encounters:  09/14/16 140 lb 3.2 oz (63.6 kg)  09/06/15 139 lb 14.4 oz (63.5 kg)  06/14/15 143 lb 6.4 oz (65 kg)     Lab Results  Component Value Date  WBC 9.6 09/06/2015   HGB 14.3 09/06/2015   HCT 44.0 09/06/2015   PLT 184 09/06/2015   GLUCOSE 100 (H) 09/06/2015   CHOL 185 09/06/2015   TRIG 95 09/06/2015   HDL 60 09/06/2015   LDLDIRECT 157.4 01/29/2011   LDLCALC 106 (H) 09/06/2015   ALT 21 09/05/2015   AST 21 09/05/2015   NA 137 09/06/2015   K 4.8 09/06/2015   CL 103 09/06/2015   CREATININE 1.12 (H) 09/06/2015   BUN 22  (H) 09/06/2015   CO2 25 09/06/2015   TSH 4.301 09/05/2015   INR 0.96 09/05/2015    Dg Chest 2 View  Result Date: 09/05/2015 CLINICAL DATA:  One day history of chest pain EXAM: CHEST  2 VIEW COMPARISON:  None. FINDINGS: There is no edema or consolidation. The heart size and pulmonary vascularity are normal. No adenopathy. There is calcification in the mitral annulus. No pneumothorax. No bone lesions. IMPRESSION: No edema or consolidation. Electronically Signed   By: Bretta BangWilliam  Woodruff III M.D.   On: 09/05/2015 11:38   Nm Myocar Multi W/spect W/wall Motion / Ef  Result Date: 09/06/2015 CLINICAL DATA:  82 year old female with chest pain, hypertension and abnormal EKG EXAM: MYOCARDIAL IMAGING WITH SPECT (REST AND PHARMACOLOGIC-STRESS) GATED LEFT VENTRICULAR WALL MOTION STUDY LEFT VENTRICULAR EJECTION FRACTION TECHNIQUE: Standard myocardial SPECT imaging was performed after resting intravenous injection of 10 mCi Tc-6378m sestamibi. Subsequently, intravenous infusion of Lexiscan was performed under the supervision of the Cardiology staff. At peak effect of the drug, 30 mCi Tc-6378m sestamibi was injected intravenously and standard myocardial SPECT imaging was performed. Quantitative gated imaging was also performed to evaluate left ventricular wall motion, and estimate left ventricular ejection fraction. COMPARISON:  Chest x-ray 09/05/2015 FINDINGS: Perfusion: No decreased activity in the left ventricle on stress imaging to suggest reversible ischemia or infarction. Wall Motion: Normal left ventricular wall motion. No left ventricular dilation. Left Ventricular Ejection Fraction: 84 % End diastolic volume 37 ml End systolic volume 6 ml IMPRESSION: 1. No reversible ischemia or infarction. 2. Normal left ventricular wall motion. 3. Left ventricular ejection fraction 84% which is likely exaggerated and due in part to small left ventricular volume. 4. Low-risk stress test findings*. *2012 Appropriate Use Criteria  for Coronary Revascularization Focused Update: J Am Coll Cardiol. 2012;59(9):857-881. http://content.dementiazones.comonlinejacc.org/article.aspx?articleid=1201161 Electronically Signed   By: Malachy MoanHeath  McCullough M.D.   On: 09/06/2015 14:44     Assessment & Plan:  Plan  I have discontinued Ms. Reznik's lisinopril-hydrochlorothiazide and lisinopril-hydrochlorothiazide. I am also having her start on lisinopril. Additionally, I am having her maintain her calcium carbonate, sodium-potassium bicarbonate, Multiple Vitamins-Minerals (ICAPS AREDS 2 PO), aspirin, nitroGLYCERIN, pantoprazole, and levothyroxine.  Meds ordered this encounter  Medications  . DISCONTD: lisinopril-hydrochlorothiazide (PRINZIDE,ZESTORETIC) 10-12.5 MG tablet    Sig: Take 1 tablet by mouth daily.    Dispense:  90 tablet    Refill:  1  . levothyroxine (SYNTHROID, LEVOTHROID) 88 MCG tablet    Sig: Take 1 tablet (88 mcg total) by mouth daily.    Dispense:  90 tablet    Refill:  1  . lisinopril (PRINIVIL,ZESTRIL) 20 MG tablet    Sig: Take 1 tablet (20 mg total) by mouth daily.    Dispense:  90 tablet    Refill:  3    Problem List Items Addressed This Visit      Unprioritized   Chest pain   Relevant Orders   Ambulatory referral to Cardiology   Hypothyroidism - Primary   Relevant Medications  levothyroxine (SYNTHROID, LEVOTHROID) 88 MCG tablet   Other Relevant Orders   Comprehensive metabolic panel   Lipid panel   CBC with Differential/Platelet   POCT urinalysis dipstick (Completed)   TSH   Essential hypertension    Stable Cont meds-- pt c/o hct making her have to get up to go to bathroom too much She has to take it a lunch time because her synthroid needs to be taking first thing in am on empty stomach      Relevant Medications   lisinopril (PRINIVIL,ZESTRIL) 20 MG tablet   Other Relevant Orders   Comprehensive metabolic panel   Lipid panel   CBC with Differential/Platelet   POCT urinalysis dipstick (Completed)   TSH       Follow-up: Return in about 6 months (around 04/01/2017) for hypertension.  Donato SchultzYvonne R Lowne Chase, DO

## 2016-10-02 NOTE — Progress Notes (Signed)
Pre visit review using our clinic review tool, if applicable. No additional management support is needed unless otherwise documented below in the visit note. 

## 2016-10-02 NOTE — Assessment & Plan Note (Addendum)
Stable Cont meds-- pt c/o hct making her have to get up to go to bathroom too much She has to take it a lunch time because her synthroid needs to be taking first thing in am on empty stomach

## 2016-10-02 NOTE — Addendum Note (Signed)
Addended by: Harley AltoPRICE, Zebulen Simonis M on: 10/02/2016 01:53 PM   Modules accepted: Orders

## 2016-10-02 NOTE — Assessment & Plan Note (Signed)
Has had no more episodes-- see ER visit--- but is requesting f/u with cardiology

## 2016-10-02 NOTE — Telephone Encounter (Signed)
[facility] [place] [place] - [address] [phone] (Phone) [phone] (Fax)   Pharmacy in need of clarification regarding which Rx to fill levothyroxine (SYNTHROID, LEVOTHROID) 88 MCG tablet or lisinopril (PRINIVIL,ZESTRIL) 20 MG tablet, please advise

## 2016-10-02 NOTE — Telephone Encounter (Signed)
Returned call to pharmacy. There is no issue with levothyroxine. They received rxs for both lisinopril-HCTZ and plain lisinopril and needed to know which to fill. Reviewed office note, lisinopril-HCTZ discontinued and lisinopril ordered per provider. Pharmacy made aware to fill plain lisinopril.

## 2016-10-02 NOTE — Patient Instructions (Signed)
Hypertension Hypertension, commonly called high blood pressure, is when the force of blood pumping through your arteries is too strong. Your arteries are the blood vessels that carry blood from your heart throughout your body. A blood pressure reading consists of a higher number over a lower number, such as 110/72. The higher number (systolic) is the pressure inside your arteries when your heart pumps. The lower number (diastolic) is the pressure inside your arteries when your heart relaxes. Ideally you want your blood pressure below 120/80. Hypertension forces your heart to work harder to pump blood. Your arteries may become narrow or stiff. Having untreated or uncontrolled hypertension can cause heart attack, stroke, kidney disease, and other problems. What increases the risk? Some risk factors for high blood pressure are controllable. Others are not. Risk factors you cannot control include:  Race. You may be at higher risk if you are African American.  Age. Risk increases with age.  Gender. Men are at higher risk than women before age 45 years. After age 65, women are at higher risk than men. Risk factors you can control include:  Not getting enough exercise or physical activity.  Being overweight.  Getting too much fat, sugar, calories, or salt in your diet.  Drinking too much alcohol. What are the signs or symptoms? Hypertension does not usually cause signs or symptoms. Extremely high blood pressure (hypertensive crisis) may cause headache, anxiety, shortness of breath, and nosebleed. How is this diagnosed? To check if you have hypertension, your health care provider will measure your blood pressure while you are seated, with your arm held at the level of your heart. It should be measured at least twice using the same arm. Certain conditions can cause a difference in blood pressure between your right and left arms. A blood pressure reading that is higher than normal on one occasion does  not mean that you need treatment. If it is not clear whether you have high blood pressure, you may be asked to return on a different day to have your blood pressure checked again. Or, you may be asked to monitor your blood pressure at home for 1 or more weeks. How is this treated? Treating high blood pressure includes making lifestyle changes and possibly taking medicine. Living a healthy lifestyle can help lower high blood pressure. You may need to change some of your habits. Lifestyle changes may include:  Following the DASH diet. This diet is high in fruits, vegetables, and whole grains. It is low in salt, red meat, and added sugars.  Keep your sodium intake below 2,300 mg per day.  Getting at least 30-45 minutes of aerobic exercise at least 4 times per week.  Losing weight if necessary.  Not smoking.  Limiting alcoholic beverages.  Learning ways to reduce stress. Your health care provider may prescribe medicine if lifestyle changes are not enough to get your blood pressure under control, and if one of the following is true:  You are 18-59 years of age and your systolic blood pressure is above 140.  You are 60 years of age or older, and your systolic blood pressure is above 150.  Your diastolic blood pressure is above 90.  You have diabetes, and your systolic blood pressure is over 140 or your diastolic blood pressure is over 90.  You have kidney disease and your blood pressure is above 140/90.  You have heart disease and your blood pressure is above 140/90. Your personal target blood pressure may vary depending on your medical   conditions, your age, and other factors. Follow these instructions at home:  Have your blood pressure rechecked as directed by your health care provider.  Take medicines only as directed by your health care provider. Follow the directions carefully. Blood pressure medicines must be taken as prescribed. The medicine does not work as well when you skip  doses. Skipping doses also puts you at risk for problems.  Do not smoke.  Monitor your blood pressure at home as directed by your health care provider. Contact a health care provider if:  You think you are having a reaction to medicines taken.  You have recurrent headaches or feel dizzy.  You have swelling in your ankles.  You have trouble with your vision. Get help right away if:  You develop a severe headache or confusion.  You have unusual weakness, numbness, or feel faint.  You have severe chest or abdominal pain.  You vomit repeatedly.  You have trouble breathing. This information is not intended to replace advice given to you by your health care provider. Make sure you discuss any questions you have with your health care provider. Document Released: 10/29/2005 Document Revised: 04/05/2016 Document Reviewed: 08/21/2013 Elsevier Interactive Patient Education  2017 Elsevier Inc.  

## 2016-10-03 LAB — URINE CULTURE

## 2016-12-31 ENCOUNTER — Encounter: Payer: Self-pay | Admitting: Cardiology

## 2017-01-08 NOTE — Progress Notes (Signed)
Cardiology Office Note    Date:  01/09/2017   ID:  [name] GowdaCarrie S Muzyka, DOB 11/21/1926, MRN [id number]  PCP:  Donato SchultzYvonne R Lowne Chase, DO  Cardiologist:  Sheina Mcleish [place], MD    History of Present Illness:  [name] [name] is a 83 y.o. female seen for evaluation of chest pain at the request of Dr. Laury AxonLowne. She has a past medical history of hypertension, hypothyroidism, hyperlipidemia and a family history of heart disease but no personal history of cardiac issues. She never smoked. Her last echocardiogram in August 2016 showed moderate LVH, EF 60-65%, grade 1 diastolic dysfunction, high ventricular filling pressure, mild MR, mild left atrial enlargement, PAS be 36. She was admitted in October 2016 with chest pain and ruled out for MI. A Myoview study was normal.   Since her last visit she has really done quite well. She describes her prior chest pain as a "bubbling" in her chest. This has resolved. No chest pain currently or SOB. She is active doing her own house work and shopping. No edema or palpitations.   Past Medical History:  Diagnosis Date  . Arthritis    "right thumb" (09/05/2015)  . Hyperlipidemia   . Hypertension   . Hypothyroidism   . Osteoporosis     Past Surgical History:  Procedure Laterality Date  . CATARACT EXTRACTION W/ INTRAOCULAR LENS  IMPLANT, BILATERAL Bilateral 2010  . CLOSED REDUCTION SHOULDER DISLOCATION Right   . OVARIAN CYST SURGERY  1949  . TONSILLECTOMY      Current Medications: Outpatient Medications Prior to Visit  Medication Sig Dispense Refill  . Calcium Carbonate (CALTRATE 600) 1500 MG TABS Take 1 tablet by mouth daily.     Marland Kitchen. levothyroxine (SYNTHROID, LEVOTHROID) 88 MCG tablet Take 1 tablet (88 mcg total) by mouth daily. 90 tablet 1  . lisinopril (PRINIVIL,ZESTRIL) 20 MG tablet Take 1 tablet (20 mg total) by mouth daily. 90 tablet 3  . Multiple Vitamins-Minerals (ICAPS AREDS 2 PO) Take 1 tablet by mouth daily.    . nitroGLYCERIN (NITROSTAT) 0.4 MG SL tablet  Place 1 tablet (0.4 mg total) under the tongue every 5 (five) minutes x 3 doses as needed for chest pain. 25 tablet 3  . sodium-potassium bicarbonate (ALKA-SELTZER GOLD) TBEF dissolvable tablet Take 1 tablet by mouth daily as needed (indigestion).    Marland Kitchen. aspirin EC 81 MG EC tablet Take 1 tablet (81 mg total) by mouth daily. (Patient not taking: Reported on 10/02/2016)    . pantoprazole (PROTONIX) 40 MG tablet Take 1 tablet (40 mg total) by mouth daily. 30 tablet 5   No facility-administered medications prior to visit.      Allergies:   Patient has no known allergies.   Social History   Social History  . Marital status: Married    Spouse name: N/A  . Number of children: N/A  . Years of education: N/A   Social History Main Topics  . Smoking status: Never Smoker  . Smokeless tobacco: Never Used  . Alcohol use No  . Drug use: No  . Sexual activity: Yes    Partners: Male   Other Topics Concern  . None   Social History Narrative  . None     Family History:  The patient's family history includes Breast cancer in her daughter; Cancer in her father and mother; Heart disease in her brother; Ovarian cancer in her sister.   ROS:   Please see the history of present illness.    ROS All  other systems reviewed and are negative.   PHYSICAL EXAM:   VS:  BP (!) 178/86   Pulse 73   Ht 5' 3.5" (1.613 m)   Wt 142 lb 12.8 oz (64.8 kg)   BMI 24.90 kg/m   Repeat BP160/60.  GEN: Well nourished, well developed, in no acute distress  HEENT: normal  Neck: no JVD, carotid bruits, or masses Cardiac: RRR; no murmurs, rubs, or gallops,no edema  Respiratory:  clear to auscultation bilaterally, normal work of breathing GI: soft, nontender, nondistended, + BS MS: no deformity or atrophy  Skin: warm and dry, no rash Neuro:  Alert and Oriented x 3, Strength and sensation are intact Psych: euthymic mood, full affect  Wt Readings from Last 3 Encounters:  01/09/17 142 lb 12.8 oz (64.8 kg)  09/14/16  140 lb 3.2 oz (63.6 kg)  09/06/15 139 lb 14.4 oz (63.5 kg)      Studies/Labs Reviewed:   EKG:  EKG is ordered today.  The ekg ordered today demonstrates NSR with normal Ecg. I have personally reviewed and interpreted this study.   Recent Labs: 10/02/2016: ALT 20; BUN 24; Creatinine, Ser 0.96; Hemoglobin 15.0; Platelets 194.0; Potassium 3.9; Sodium 140; TSH 3.63   Lipid Panel    Component Value Date/Time   CHOL 215 (H) 10/02/2016 1107   TRIG 95.0 10/02/2016 1107   HDL 73.20 10/02/2016 1107   CHOLHDL 3 10/02/2016 1107   VLDL 19.0 10/02/2016 1107   LDLCALC 123 (H) 10/02/2016 1107   LDLDIRECT 157.4 01/29/2011 0959    Additional studies/ records that were reviewed today include:  Echo 06/22/15: Study Conclusions  - Left ventricle: The cavity size was normal. Wall thickness was   increased in a pattern of moderate LVH. Systolic function was   normal. The estimated ejection fraction was in the range of 60%   to 65%. Wall motion was normal; there were no regional wall   motion abnormalities. Doppler parameters are consistent with   abnormal left ventricular relaxation (grade 1 diastolic   dysfunction). Doppler parameters are consistent with high   ventricular filling pressure. - Mitral valve: Moderately calcified annulus. Mildly thickened   leaflets . There was mild regurgitation. - Left atrium: The atrium was mildly dilated. - Pulmonary arteries: Systolic pressure was mildly increased. PA   peak pressure: 36 mm Hg (S).  Myoview 09/05/17: MYOCARDIAL IMAGING WITH SPECT (REST AND PHARMACOLOGIC-STRESS)  GATED LEFT VENTRICULAR WALL MOTION STUDY  LEFT VENTRICULAR EJECTION FRACTION  TECHNIQUE: Standard myocardial SPECT imaging was performed after resting intravenous injection of 10 mCi Tc-62m sestamibi. Subsequently, intravenous infusion of Lexiscan was performed under the supervision of the Cardiology staff. At peak effect of the drug, 30 mCi Tc-47m sestamibi was injected  intravenously and standard myocardial SPECT imaging was performed. Quantitative gated imaging was also performed to evaluate left ventricular wall motion, and estimate left ventricular ejection fraction.  COMPARISON:  Chest x-ray 09/05/2015  FINDINGS: Perfusion: No decreased activity in the left ventricle on stress imaging to suggest reversible ischemia or infarction.  Wall Motion: Normal left ventricular wall motion. No left ventricular dilation.  Left Ventricular Ejection Fraction: 84 %  End diastolic volume 37 ml  End systolic volume 6 ml  IMPRESSION: 1. No reversible ischemia or infarction.  2. Normal left ventricular wall motion.  3. Left ventricular ejection fraction 84% which is likely exaggerated and due in part to small left ventricular volume.  4. Low-risk stress test findings*.  *2012 Appropriate Use Criteria for Coronary Revascularization Focused Update:  J Am Coll Cardiol. 2012;59(9):857-881. http://content.dementiazones.com.aspx?articleid=1201161   Electronically Signed   By: Malachy Moan M.D.   On: 09/06/2015 14:44  ASSESSMENT:    1. Essential hypertension   2. Chest pain, unspecified type      PLAN:  In order of problems listed above:  1. She is currently asymptomatic. Extensive cardiac evaluation in 2016 was normal. She looks great for her age. Encouraged her to remain active. I will be happy to see back as needed. 2. BP is elevated today but prior readings have been good. Will monitor for now. Follow up with Dr. Laury Axon.     Medication Adjustments/Labs and Tests Ordered: Current medicines are reviewed at length with the patient today.  Concerns regarding medicines are outlined above.  Medication changes, Labs and Tests ordered today are listed in the Patient Instructions below. Patient Instructions  Continue your current therapy  I will see you as needed.    Signed, Charlette Hennings [place], MD  01/09/2017 11:45 AM    [facility] [address], [place], [place], [address] [phone]

## 2017-01-09 ENCOUNTER — Ambulatory Visit (INDEPENDENT_AMBULATORY_CARE_PROVIDER_SITE_OTHER): Payer: Medicare Other | Admitting: Cardiology

## 2017-01-09 ENCOUNTER — Encounter: Payer: Self-pay | Admitting: Cardiology

## 2017-01-09 VITALS — BP 178/86 | HR 73 | Ht 63.5 in | Wt 142.8 lb

## 2017-01-09 DIAGNOSIS — R079 Chest pain, unspecified: Secondary | ICD-10-CM

## 2017-01-09 DIAGNOSIS — I10 Essential (primary) hypertension: Secondary | ICD-10-CM | POA: Diagnosis not present

## 2017-01-09 NOTE — Patient Instructions (Signed)
Continue your current therapy  I will see you as needed. 

## 2017-02-15 ENCOUNTER — Other Ambulatory Visit: Payer: Self-pay | Admitting: Family Medicine

## 2017-02-15 DIAGNOSIS — E039 Hypothyroidism, unspecified: Secondary | ICD-10-CM

## 2017-02-15 MED ORDER — LEVOTHYROXINE SODIUM 88 MCG PO TABS: 88.0000 ug | ORAL_TABLET | Freq: Every day | ORAL | 1 refills | Status: DC

## 2017-06-26 ENCOUNTER — Other Ambulatory Visit: Payer: Self-pay | Admitting: Podiatry

## 2017-06-26 ENCOUNTER — Ambulatory Visit (INDEPENDENT_AMBULATORY_CARE_PROVIDER_SITE_OTHER): Payer: Medicare Other

## 2017-06-26 ENCOUNTER — Ambulatory Visit (INDEPENDENT_AMBULATORY_CARE_PROVIDER_SITE_OTHER): Payer: Medicare Other | Admitting: Podiatry

## 2017-06-26 ENCOUNTER — Encounter: Payer: Self-pay | Admitting: Podiatry

## 2017-06-26 DIAGNOSIS — M779 Enthesopathy, unspecified: Secondary | ICD-10-CM

## 2017-06-26 DIAGNOSIS — B351 Tinea unguium: Secondary | ICD-10-CM | POA: Diagnosis not present

## 2017-06-26 DIAGNOSIS — M79675 Pain in left toe(s): Secondary | ICD-10-CM

## 2017-06-26 DIAGNOSIS — M79671 Pain in right foot: Secondary | ICD-10-CM

## 2017-06-26 DIAGNOSIS — M722 Plantar fascial fibromatosis: Secondary | ICD-10-CM

## 2017-06-26 DIAGNOSIS — M79672 Pain in left foot: Secondary | ICD-10-CM | POA: Diagnosis not present

## 2017-06-26 DIAGNOSIS — M79674 Pain in right toe(s): Secondary | ICD-10-CM

## 2017-06-26 MED ORDER — TRIAMCINOLONE ACETONIDE 10 MG/ML IJ SUSP
10.0000 mg | Freq: Once | INTRAMUSCULAR | Status: AC
  Administered 2017-06-26: 10 mg

## 2017-06-27 NOTE — Progress Notes (Signed)
Subjective:    Patient ID: [name] [name], [name] [name]   DOB: 83 y.o.   MRN: [id number]   HPI patient presents stating that she has nail disease 1-5 both feet that she cannot cut and are painful and she's had pain in her left heel of 6 months duration which is increasingly tender    ROS      Objective:  Physical Exam neurovascular status intact with patient noted to have exquisite discomfort plantar fascial left at the insertional point of the tendon into the calcaneus with inflammation and fluid around the medial band with thick yellow brittle nailbeds 1-5 both feet that are painful     Assessment:   Acute plantar fasciitis left with mycotic nail infection 1-5 both feet      Plan:   H&P condition reviewed debrided nailbeds 1-5 both feet with no iatrogenic bleeding and injected the left plantar fascia 3 mg Kenalog 5 mg Xylocaine to reduce inflammatory process   X-rays left indicate moderate osteoporosis with spur formation but no indication of stress fracture

## 2017-07-12 ENCOUNTER — Ambulatory Visit: Payer: Medicare Other | Admitting: Podiatry

## 2017-07-25 ENCOUNTER — Ambulatory Visit: Payer: Medicare Other | Admitting: Podiatry

## 2017-08-06 DIAGNOSIS — H524 Presbyopia: Secondary | ICD-10-CM | POA: Diagnosis not present

## 2017-08-06 DIAGNOSIS — H26492 Other secondary cataract, left eye: Secondary | ICD-10-CM | POA: Diagnosis not present

## 2017-08-06 DIAGNOSIS — H353131 Nonexudative age-related macular degeneration, bilateral, early dry stage: Secondary | ICD-10-CM | POA: Diagnosis not present

## 2017-08-22 ENCOUNTER — Ambulatory Visit: Payer: Medicare Other | Admitting: Podiatry

## 2017-08-28 ENCOUNTER — Ambulatory Visit: Payer: Medicare Other | Admitting: Podiatry

## 2017-09-03 DIAGNOSIS — H26491 Other secondary cataract, right eye: Secondary | ICD-10-CM | POA: Diagnosis not present

## 2017-09-11 ENCOUNTER — Ambulatory Visit: Payer: Medicare Other | Admitting: Podiatry

## 2017-09-19 ENCOUNTER — Other Ambulatory Visit: Payer: Self-pay

## 2017-09-19 DIAGNOSIS — Z23 Encounter for immunization: Secondary | ICD-10-CM | POA: Diagnosis not present

## 2017-09-19 MED ORDER — LISINOPRIL 20 MG PO TABS: 20.0000 mg | ORAL_TABLET | Freq: Every day | ORAL | 0 refills | Status: DC

## 2017-09-26 ENCOUNTER — Ambulatory Visit: Payer: Medicare Other

## 2017-09-30 ENCOUNTER — Ambulatory Visit (INDEPENDENT_AMBULATORY_CARE_PROVIDER_SITE_OTHER): Payer: Medicare Other | Admitting: Podiatry

## 2017-09-30 ENCOUNTER — Encounter: Payer: Self-pay | Admitting: Podiatry

## 2017-09-30 DIAGNOSIS — M722 Plantar fascial fibromatosis: Secondary | ICD-10-CM

## 2017-09-30 DIAGNOSIS — M79674 Pain in right toe(s): Secondary | ICD-10-CM

## 2017-09-30 DIAGNOSIS — M79675 Pain in left toe(s): Secondary | ICD-10-CM | POA: Diagnosis not present

## 2017-09-30 DIAGNOSIS — B351 Tinea unguium: Secondary | ICD-10-CM | POA: Diagnosis not present

## 2017-10-04 NOTE — Progress Notes (Signed)
Subjective:    Patient ID: [name] [name], female   DOB: 83 y.o.   MRN: [id number]   HPI patient presents with elongated nails 1-5 both feet with patient also noted to have heel pain bilateral with chronic inflammation    ROS      Objective:  Physical Exam neurovascular status unchanged with patient having thick yellow brittle nailbeds 1-5 both feet with pain and chronic discomfort of the plantar fascial band bilateral     Assessment:   Chronic fungal mycotic infection with pain 1-5 both feet and chronic heel pain bilateral with diminished fat pad     Plan:    H&P condition reviewed and debridement of nailbeds 1-5 both feet accomplished with no iatrogenic bleeding noted. I then went ahead and scanned for a custom orthotic that we'll be made with deep heel seat and very soft to try to take pressure off the chronic plantar fascial symptomatology

## 2017-10-10 ENCOUNTER — Ambulatory Visit: Payer: Medicare Other

## 2017-10-24 ENCOUNTER — Encounter: Payer: Medicare Other | Admitting: Orthotics

## 2017-11-14 ENCOUNTER — Ambulatory Visit: Payer: Medicare Other | Admitting: Orthotics

## 2017-11-14 DIAGNOSIS — M79671 Pain in right foot: Secondary | ICD-10-CM

## 2017-11-14 DIAGNOSIS — M722 Plantar fascial fibromatosis: Secondary | ICD-10-CM

## 2017-11-14 DIAGNOSIS — M79672 Pain in left foot: Secondary | ICD-10-CM

## 2017-11-14 NOTE — Progress Notes (Addendum)
Patient presented today with both of her daughters to pick up function orthotics to address plantar fasciitis pain LEFT.   However, during visit and prior to dispensing f/o patient complained of foot pain RIGHT with discomfort when palpating navicular and along posterior tendon.  Patient does have noticeable medial column collapse and I told her, and her daughters, of the possibility of PTTD, early stages BUT she would have to see a physcian to confirm as I couldn't .   The patient wasn't pleased with the foot orthotics because she was unsure if they would help and they would't fit into her dress flats; patient also wasn't sure she wanted to sign ABN.  Her daughters however wanted her mother to have the f/o b/c they understood the importance of supporting the arch, especially now right over left.    Since they could not come to an agreement (after 45 minutes), I made the decision to let the daughters take the f/o and go to shoe market and try to find a shoe that will accommodate the same; If mother will not be compliant, then [facility] and we just write it off.   December 05, 2017  Patient returned, does not want..we will write off.

## 2017-12-12 ENCOUNTER — Other Ambulatory Visit: Payer: Self-pay | Admitting: Family Medicine

## 2017-12-12 DIAGNOSIS — E039 Hypothyroidism, unspecified: Secondary | ICD-10-CM

## 2018-01-07 ENCOUNTER — Other Ambulatory Visit: Payer: Self-pay | Admitting: Family Medicine

## 2018-01-07 DIAGNOSIS — I10 Essential (primary) hypertension: Secondary | ICD-10-CM

## 2018-01-08 ENCOUNTER — Telehealth: Payer: Self-pay | Admitting: Family Medicine

## 2018-01-08 DIAGNOSIS — I10 Essential (primary) hypertension: Secondary | ICD-10-CM

## 2018-01-08 MED ORDER — LISINOPRIL 20 MG PO TABS: 20.0000 mg | ORAL_TABLET | Freq: Every day | ORAL | 0 refills | Status: DC

## 2018-01-08 NOTE — Telephone Encounter (Signed)
Copied from CRM [id number]. Topic: Inquiry >> Jan 08, 2018 10:37 AM Windy KalataMichael, Lyndsie Wallman L, NT wrote: Patient husband is calling and states that the prescription for Lisinopril has to be 90 days instead of 30 because its cheaper for him. He states he feels like we are holding his wife "hostage" because I informed him she needed an OV to get more refills. He states that she is almost out. I informed him a refill was sent into the office for 30 days that patient would not be out of medication. Husband says a 90 day needs to be called in before he will go and get the medicine. Scheduled patient a OV for 01/16/18 with Dr. Laury AxonLowne. He would like to know if she needs to fast for the appt. Patient is requesting Dr. Laury AxonLowne nurse call him back at [phone] to let him know the 90 day medicine was approved and to inform him if she needs to be fasting for appt or not. Please advise.

## 2018-01-08 NOTE — Telephone Encounter (Signed)
Patient husband notified that new rx has been sent in since they made appt and must keep appt.  Has to fast for appt for labs

## 2018-01-16 ENCOUNTER — Ambulatory Visit: Payer: Medicare Other | Admitting: Family Medicine

## 2018-01-16 ENCOUNTER — Encounter: Payer: Self-pay | Admitting: Family Medicine

## 2018-01-16 VITALS — BP 136/66 | HR 61 | Temp 97.7°F | Resp 14 | Ht 63.39 in | Wt 133.4 lb

## 2018-01-16 DIAGNOSIS — I10 Essential (primary) hypertension: Secondary | ICD-10-CM

## 2018-01-16 DIAGNOSIS — R5383 Other fatigue: Secondary | ICD-10-CM | POA: Diagnosis not present

## 2018-01-16 DIAGNOSIS — N183 Chronic kidney disease, stage 3 unspecified: Secondary | ICD-10-CM

## 2018-01-16 DIAGNOSIS — E039 Hypothyroidism, unspecified: Secondary | ICD-10-CM | POA: Diagnosis not present

## 2018-01-16 DIAGNOSIS — R531 Weakness: Secondary | ICD-10-CM | POA: Diagnosis not present

## 2018-01-16 LAB — COMPREHENSIVE METABOLIC PANEL
ALK PHOS: 131 U/L — AB (ref 39–117)
ALT: 68 U/L — ABNORMAL HIGH (ref 0–35)
AST: 40 U/L — AB (ref 0–37)
Albumin: 4 g/dL (ref 3.5–5.2)
BUN: 18 mg/dL (ref 6–23)
CO2: 29 meq/L (ref 19–32)
CREATININE: 0.82 mg/dL (ref 0.40–1.20)
Calcium: 10.2 mg/dL (ref 8.4–10.5)
Chloride: 105 mEq/L (ref 96–112)
GFR: 69.74 mL/min (ref >60.00)
GLUCOSE: 86 mg/dL (ref 70–99)
Potassium: 4.3 mEq/L (ref 3.5–5.1)
Sodium: 140 mEq/L (ref 135–145)
TOTAL PROTEIN: 6.9 g/dL (ref 6.0–8.3)
Total Bilirubin: 0.6 mg/dL (ref 0.2–1.2)

## 2018-01-16 LAB — CBC WITH DIFFERENTIAL/PLATELET
BASOS PCT: 0.6 % (ref 0.0–3.0)
Basophils Absolute: 0 10*3/uL (ref 0.0–0.1)
EOS PCT: 1.1 % (ref 0.0–5.0)
Eosinophils Absolute: 0.1 10*3/uL (ref 0.0–0.7)
HCT: 45.3 % (ref 36.0–46.0)
Hemoglobin: 14.9 g/dL (ref 12.0–15.0)
LYMPHS ABS: 1.8 10*3/uL (ref 0.7–4.0)
LYMPHS PCT: 21.9 % (ref 12.0–46.0)
MCHC: 33 g/dL (ref 30.0–36.0)
MCV: 93.3 fl (ref 78.0–100.0)
MONO ABS: 0.7 10*3/uL (ref 0.1–1.0)
MONOS PCT: 9.1 % (ref 3.0–12.0)
NEUTROS PCT: 67.3 % (ref 43.0–77.0)
Neutro Abs: 5.5 10*3/uL (ref 1.4–7.7)
PLATELETS: 152 10*3/uL (ref 150.0–400.0)
RBC: 4.85 Mil/uL (ref 3.87–5.11)
RDW: 13.6 % (ref 11.5–15.5)
WBC: 8.2 10*3/uL (ref 4.0–10.5)

## 2018-01-16 LAB — LIPID PANEL
CHOL/HDL RATIO: 2
Cholesterol: 199 mg/dL (ref 0–200)
HDL: 94.6 mg/dL (ref >39.00)
LDL Cholesterol: 86 mg/dL (ref 0–99)
NONHDL: 104
Triglycerides: 88 mg/dL (ref 0.0–149.0)
VLDL: 17.6 mg/dL (ref 0.0–40.0)

## 2018-01-16 LAB — VITAMIN B12: Vitamin B-12: 929 pg/mL — ABNORMAL HIGH (ref 211–911)

## 2018-01-16 LAB — TSH: TSH: 2.61 u[IU]/mL (ref 0.35–4.50)

## 2018-01-16 NOTE — Progress Notes (Signed)
Subjective:  I acted as a Neurosurgeon for [employer]. Fuller Song, RMA   Patient ID: [name] [name], female    DOB: 20-Nov-1926, 84 y.o.   MRN: [id number]  Chief Complaint  Patient presents with  . Medication Refill  . Follow-up    HPI  Patient is in today for follow up visit. She really has no complaints.  Has some trouble with vision but is seeing her eye dr regularly and is seeing her audiology as well.    Patient Care Team: Zola Button, Grayling Congress, DO as PCP - General   Past Medical History:  Diagnosis Date  . Arthritis    "right thumb" (09/05/2015)  . Hyperlipidemia   . Hypertension   . Hypothyroidism   . Osteoporosis     Past Surgical History:  Procedure Laterality Date  . CATARACT EXTRACTION W/ INTRAOCULAR LENS  IMPLANT, BILATERAL Bilateral 2010  . CLOSED REDUCTION SHOULDER DISLOCATION Right   . OVARIAN CYST SURGERY  1949  . TONSILLECTOMY      Family History  Problem Relation Age of Onset  . Cancer Mother        bladder  . Cancer Father        unknown  . Ovarian cancer Sister   . Heart disease Brother        All 5 brothers have had heart issues starting in their 31s  . Breast cancer Daughter     Social History   Socioeconomic History  . Marital status: Married    Spouse name: Not on file  . Number of children: Not on file  . Years of education: Not on file  . Highest education level: Not on file  Social Needs  . Financial resource strain: Not on file  . Food insecurity - worry: Not on file  . Food insecurity - inability: Not on file  . Transportation needs - medical: Not on file  . Transportation needs - non-medical: Not on file  Occupational History  . Not on file  Tobacco Use  . Smoking status: Never Smoker  . Smokeless tobacco: Never Used  Substance and Sexual Activity  . Alcohol use: No  . Drug use: No  . Sexual activity: Yes    Partners: Male  Other Topics Concern  . Not on file  Social History Narrative  . Not on file    Outpatient  Medications Prior to Visit  Medication Sig Dispense Refill  . Calcium Carbonate (CALTRATE 600) 1500 MG TABS Take 1 tablet by mouth daily.     Marland Kitchen levothyroxine (SYNTHROID, LEVOTHROID) 88 MCG tablet TAKE ONE TABLET BY MOUTH EVERY DAY 90 tablet 1  . lisinopril (PRINIVIL,ZESTRIL) 20 MG tablet Take 1 tablet (20 mg total) by mouth daily. 90 tablet 0  . Multiple Vitamins-Minerals (ICAPS AREDS 2 PO) Take 1 tablet by mouth daily.    . nitroGLYCERIN (NITROSTAT) 0.4 MG SL tablet Place 1 tablet (0.4 mg total) under the tongue every 5 (five) minutes x 3 doses as needed for chest pain. 25 tablet 3  . sodium-potassium bicarbonate (ALKA-SELTZER GOLD) TBEF dissolvable tablet Take 1 tablet by mouth daily as needed (indigestion).     No facility-administered medications prior to visit.     No Known Allergies  Review of Systems  Constitutional: Positive for malaise/fatigue. Negative for chills and fever.  HENT: Negative for congestion and hearing loss.   Eyes: Negative for discharge.  Respiratory: Negative for cough, sputum production and shortness of breath.   Cardiovascular: Negative for chest pain,  palpitations and leg swelling.  Gastrointestinal: Negative for abdominal pain, blood in stool, constipation, diarrhea, heartburn, nausea and vomiting.  Genitourinary: Negative for dysuria, frequency, hematuria and urgency.  Musculoskeletal: Negative for back pain, falls and myalgias.  Skin: Negative for rash.  Neurological: Negative for dizziness, sensory change, loss of consciousness, weakness and headaches.  Endo/Heme/Allergies: Negative for environmental allergies. Does not bruise/bleed easily.  Psychiatric/Behavioral: Negative for depression and suicidal ideas. The patient is not nervous/anxious and does not have insomnia.        Objective:    Physical Exam  Constitutional: She is oriented to person, place, and time. She appears well-developed and well-nourished.  HENT:  Head: Normocephalic and  atraumatic.  + cerumen R ear   Eyes: Conjunctivae and EOM are normal.  Neck: Normal range of motion. Neck supple. No JVD present. Carotid bruit is not present. No thyromegaly present.  Cardiovascular: Normal rate, regular rhythm and normal heart sounds.  No murmur heard. Pulmonary/Chest: Effort normal and breath sounds normal. No respiratory distress. She has no wheezes. She has no rales. She exhibits no tenderness.  Musculoskeletal: She exhibits no edema.  Neurological: She is alert and oriented to person, place, and time.  Psychiatric: She has a normal mood and affect. Her behavior is normal. Judgment and thought content normal.  Nursing note and vitals reviewed.   BP 136/66 (BP Location: Left Arm, Patient Position: Sitting, Cuff Size: Normal)   Pulse 61   Temp 97.7 F (36.5 C) (Oral)   Resp 14   Ht 5' 3.39" (1.61 m)   Wt 133 lb 6.4 oz (60.5 kg)   SpO2 98%   BMI 23.34 kg/m  Wt Readings from Last 3 Encounters:  01/16/18 133 lb 6.4 oz (60.5 kg)  01/09/17 142 lb 12.8 oz (64.8 kg)  09/14/16 140 lb 3.2 oz (63.6 kg)   BP Readings from Last 3 Encounters:  01/16/18 136/66  01/09/17 (!) 178/86  10/02/16 (!) 142/68      There is no immunization history on file for this patient.  Health Maintenance  Topic Date Due  . DEXA SCAN  01/16/2019 (Originally 11/14/1993)  . TETANUS/TDAP  01/17/2019 (Originally 11/15/1945)  . PNA vac Low Risk Adult (1 of 2 - PCV13) 01/17/2019 (Originally 11/14/1993)  . INFLUENZA VACCINE  Completed    Lab Results  Component Value Date   WBC 8.3 10/02/2016   HGB 15.0 10/02/2016   HCT 44.6 10/02/2016   PLT 194.0 10/02/2016   GLUCOSE 95 10/02/2016   CHOL 215 (H) 10/02/2016   TRIG 95.0 10/02/2016   HDL 73.20 10/02/2016   LDLDIRECT 157.4 01/29/2011   LDLCALC 123 (H) 10/02/2016   ALT 20 10/02/2016   AST 18 10/02/2016   NA 140 10/02/2016   K 3.9 10/02/2016   CL 105 10/02/2016   CREATININE 0.96 10/02/2016   BUN 24 (H) 10/02/2016   CO2 26 10/02/2016    TSH 3.63 10/02/2016   INR 0.96 09/05/2015    Lab Results  Component Value Date   TSH 3.63 10/02/2016   Lab Results  Component Value Date   WBC 8.3 10/02/2016   HGB 15.0 10/02/2016   HCT 44.6 10/02/2016   MCV 90.3 10/02/2016   PLT 194.0 10/02/2016   Lab Results  Component Value Date   NA 140 10/02/2016   K 3.9 10/02/2016   CO2 26 10/02/2016   GLUCOSE 95 10/02/2016   BUN 24 (H) 10/02/2016   CREATININE 0.96 10/02/2016   BILITOT 0.6 10/02/2016   ALKPHOS 72  10/02/2016   AST 18 10/02/2016   ALT 20 10/02/2016   PROT 7.3 10/02/2016   ALBUMIN 4.2 10/02/2016   CALCIUM 10.1 10/02/2016   ANIONGAP 9 09/06/2015   GFR 58.31 (L) 10/02/2016   Lab Results  Component Value Date   CHOL 215 (H) 10/02/2016   Lab Results  Component Value Date   HDL 73.20 10/02/2016   Lab Results  Component Value Date   LDLCALC 123 (H) 10/02/2016   Lab Results  Component Value Date   TRIG 95.0 10/02/2016   Lab Results  Component Value Date   CHOLHDL 3 10/02/2016   No results found for: HGBA1C       Assessment & Plan:   Problem List Items Addressed This Visit      Unprioritized   CKD (chronic kidney disease), stage II-III    Check labs       Essential hypertension    Well controlled, no changes to meds. Encouraged heart healthy diet such as the DASH diet and exercise as tolerated.       Relevant Orders   Comprehensive metabolic panel   Lipid panel   Hypothyroidism - Primary    con't meds Check labs      Relevant Orders   Comprehensive metabolic panel   Lipid panel   TSH    Other Visit Diagnoses    Fatigue, unspecified type       Relevant Orders   CBC with Differential/Platelet   Vitamin B12   Weakness       Relevant Orders   CBC with Differential/Platelet   Vitamin B12      I am having Tifton Callas. Furr maintain her calcium carbonate, sodium-potassium bicarbonate, Multiple Vitamins-Minerals (ICAPS AREDS 2 PO), nitroGLYCERIN, levothyroxine, and lisinopril.  No  orders of the defined types were placed in this encounter.   CMA served as Neurosurgeon during this visit. History, Physical and Plan performed by medical provider. Documentation and orders reviewed and attested to.  Donato Schultz, DO

## 2018-01-16 NOTE — Patient Instructions (Signed)

## 2018-01-16 NOTE — Assessment & Plan Note (Signed)
Well controlled, no changes to meds. Encouraged heart healthy diet such as the DASH diet and exercise as tolerated.  °

## 2018-01-16 NOTE — Assessment & Plan Note (Signed)
Check labs 

## 2018-01-16 NOTE — Assessment & Plan Note (Signed)
con't meds  Check labs 

## 2018-01-21 ENCOUNTER — Other Ambulatory Visit: Payer: Self-pay | Admitting: *Deleted

## 2018-01-21 DIAGNOSIS — R945 Abnormal results of liver function studies: Principal | ICD-10-CM

## 2018-01-21 DIAGNOSIS — R7989 Other specified abnormal findings of blood chemistry: Secondary | ICD-10-CM

## 2018-02-10 ENCOUNTER — Other Ambulatory Visit (INDEPENDENT_AMBULATORY_CARE_PROVIDER_SITE_OTHER): Payer: Medicare Other

## 2018-02-10 DIAGNOSIS — R945 Abnormal results of liver function studies: Secondary | ICD-10-CM | POA: Diagnosis not present

## 2018-02-10 DIAGNOSIS — R7989 Other specified abnormal findings of blood chemistry: Secondary | ICD-10-CM

## 2018-02-10 LAB — COMPREHENSIVE METABOLIC PANEL
ALK PHOS: 189 U/L — AB (ref 39–117)
ALT: 88 U/L — ABNORMAL HIGH (ref 0–35)
AST: 57 U/L — ABNORMAL HIGH (ref 0–37)
Albumin: 3.8 g/dL (ref 3.5–5.2)
BUN: 25 mg/dL — ABNORMAL HIGH (ref 6–23)
CO2: 30 mEq/L (ref 19–32)
Calcium: 9.9 mg/dL (ref 8.4–10.5)
Chloride: 104 mEq/L (ref 96–112)
Creatinine, Ser: 0.86 mg/dL (ref 0.40–1.20)
GFR: 66 mL/min (ref >60.00)
GLUCOSE: 99 mg/dL (ref 70–99)
POTASSIUM: 4.4 meq/L (ref 3.5–5.1)
Sodium: 141 mEq/L (ref 135–145)
TOTAL PROTEIN: 7 g/dL (ref 6.0–8.3)
Total Bilirubin: 0.8 mg/dL (ref 0.2–1.2)

## 2018-02-13 DIAGNOSIS — H35033 Hypertensive retinopathy, bilateral: Secondary | ICD-10-CM | POA: Diagnosis not present

## 2018-02-13 DIAGNOSIS — H04123 Dry eye syndrome of bilateral lacrimal glands: Secondary | ICD-10-CM | POA: Diagnosis not present

## 2018-02-13 DIAGNOSIS — H353132 Nonexudative age-related macular degeneration, bilateral, intermediate dry stage: Secondary | ICD-10-CM | POA: Diagnosis not present

## 2018-02-13 DIAGNOSIS — Z961 Presence of intraocular lens: Secondary | ICD-10-CM | POA: Diagnosis not present

## 2018-02-17 ENCOUNTER — Other Ambulatory Visit: Payer: Self-pay | Admitting: *Deleted

## 2018-02-17 DIAGNOSIS — R7989 Other specified abnormal findings of blood chemistry: Secondary | ICD-10-CM

## 2018-02-17 DIAGNOSIS — R945 Abnormal results of liver function studies: Principal | ICD-10-CM

## 2018-02-18 ENCOUNTER — Telehealth: Payer: Self-pay | Admitting: *Deleted

## 2018-02-18 NOTE — Telephone Encounter (Signed)
[name] [name] has spoken with her already due to this matter

## 2018-02-18 NOTE — Telephone Encounter (Signed)
Copied from CRM [id number]. Topic: General - Call Back - No Documentation >> Feb 17, 2018  9:56 AM Windy KalataMichael, Taylor L, NT wrote: Reason for CRM: patient states she [facility] a [facility] this [facility] and she is returning it. I do not see any documention. Patient is requesting a call back.

## 2018-02-19 ENCOUNTER — Telehealth: Payer: Self-pay | Admitting: Family Medicine

## 2018-02-19 NOTE — Telephone Encounter (Signed)
Copied from CRM [id number]. Topic: Inquiry >> Feb 19, 2018  9:05 AM Eston Mouldavis, Cheri B wrote: Reason for CRM:   Clydie BraunKaren  pts daughter would like to know if the results of the pts US will be given the day of or how will she get the results  She wants to know if she should come with pt for US because she wants to be there for results.   How long does it take to get results if not received day of   (do not see a dpr)  >> Feb 19, 2018  4:59 PM Rudi CocoLathan, Dinnis Rog M, NT wrote: Pt. Daughter calling back asking if pt. Is going to have labs read at appt.

## 2018-02-20 NOTE — Telephone Encounter (Signed)
Patient daughter notified that it will be a day or two, and sooner if there is something urgent.

## 2018-02-21 ENCOUNTER — Ambulatory Visit (HOSPITAL_BASED_OUTPATIENT_CLINIC_OR_DEPARTMENT_OTHER)
Admission: RE | Admit: 2018-02-21 | Discharge: 2018-02-21 | Disposition: A | Discharge status: Home / Self Care | Payer: Medicare Other | Source: Ambulatory Visit | Attending: Family Medicine | Admitting: Family Medicine

## 2018-02-21 DIAGNOSIS — R7989 Other specified abnormal findings of blood chemistry: Secondary | ICD-10-CM

## 2018-02-21 DIAGNOSIS — K824 Cholesterolosis of gallbladder: Secondary | ICD-10-CM | POA: Diagnosis not present

## 2018-02-21 DIAGNOSIS — R945 Abnormal results of liver function studies: Secondary | ICD-10-CM | POA: Diagnosis present

## 2018-02-21 DIAGNOSIS — K760 Fatty (change of) liver, not elsewhere classified: Secondary | ICD-10-CM | POA: Diagnosis not present

## 2018-02-24 ENCOUNTER — Telehealth: Payer: Self-pay | Admitting: *Deleted

## 2018-02-24 NOTE — Telephone Encounter (Signed)
Copied from CRM [id number]. Topic: General - Other >> Feb 24, 2018 11:16 AM Percival SpanishKennedy, Cheryl W wrote:  Pt daughter Stevphen MeuseKaren Eanes would like a call back concerning pt US. She said her Mom does not hear well  [phone]

## 2018-02-24 NOTE — Telephone Encounter (Signed)
Gave patients daughter Karen US results.

## 2018-04-24 ENCOUNTER — Other Ambulatory Visit: Payer: Self-pay | Admitting: Family Medicine

## 2018-04-24 DIAGNOSIS — E039 Hypothyroidism, unspecified: Secondary | ICD-10-CM

## 2018-04-24 DIAGNOSIS — I10 Essential (primary) hypertension: Secondary | ICD-10-CM

## 2018-06-17 ENCOUNTER — Other Ambulatory Visit: Payer: Self-pay | Admitting: Family Medicine

## 2018-06-17 DIAGNOSIS — I10 Essential (primary) hypertension: Secondary | ICD-10-CM

## 2018-06-23 ENCOUNTER — Ambulatory Visit (INDEPENDENT_AMBULATORY_CARE_PROVIDER_SITE_OTHER): Payer: Medicare Other

## 2018-06-23 DIAGNOSIS — M79671 Pain in right foot: Secondary | ICD-10-CM | POA: Diagnosis not present

## 2018-06-23 DIAGNOSIS — M79672 Pain in left foot: Secondary | ICD-10-CM | POA: Diagnosis not present

## 2018-06-23 DIAGNOSIS — M79675 Pain in left toe(s): Secondary | ICD-10-CM

## 2018-06-23 DIAGNOSIS — B351 Tinea unguium: Secondary | ICD-10-CM

## 2018-06-23 DIAGNOSIS — M79674 Pain in right toe(s): Secondary | ICD-10-CM

## 2018-06-23 DIAGNOSIS — M722 Plantar fascial fibromatosis: Secondary | ICD-10-CM

## 2018-06-25 ENCOUNTER — Ambulatory Visit: Payer: Medicare Other | Admitting: Podiatry

## 2018-07-01 NOTE — Progress Notes (Signed)
HPI Presents today chief complaint of painful elongated toenails.  Objective: Pulses are palpable bilateral nails are thick, yellow dystrophic onychomycosis and painful palpation.   Assessment: Onychomycosis with pain in limb.  Plan: Treatment of nails in thickness and length as covered service secondary to pain.  

## 2018-07-18 NOTE — Progress Notes (Signed)
Cardiology Office Note    Date:  07/21/2018   ID:  [name] [name], DOB 11-26-1926, MRN [id number]  PCP:  Donato Schultz, DO  Cardiologist:  Raeanna Soberanes [place], MD    History of Present Illness:  [name] [name] is a 84 y.o. female seen for follow up.  She has a past medical history of hypertension, hypothyroidism, hyperlipidemia and a family history of heart disease but no personal history of cardiac issues. She never smoked. Her last echocardiogram in August 2016 showed moderate LVH, EF 60-65%, grade 1 diastolic dysfunction, high ventricular filling pressure, mild MR, mild left atrial enlargement, PAS be 36. She was admitted in October 2016 with chest pain and ruled out for MI. A Myoview study was normal. She was seen in February 2018 with very atypical chest pain that was not felt to be cardiac.   She is seen back today just to be checked. No symptoms of chest pain or dyspnea. Does feel tired. Notes weight loss with no change in appetite. Husband thinks she is taking lisinopril HCT but records here just show lisinopril only.   Past Medical History:  Diagnosis Date  . Arthritis    "right thumb" (09/05/2015)  . Hyperlipidemia   . Hypertension   . Hypothyroidism   . Osteoporosis     Past Surgical History:  Procedure Laterality Date  . CATARACT EXTRACTION W/ INTRAOCULAR LENS  IMPLANT, BILATERAL Bilateral 2010  . CLOSED REDUCTION SHOULDER DISLOCATION Right   . OVARIAN CYST SURGERY  1949  . TONSILLECTOMY      Current Medications: Outpatient Medications Prior to Visit  Medication Sig Dispense Refill  . Calcium Carbonate (CALTRATE 600) 1500 MG TABS Take 1 tablet by mouth daily.     Marland Kitchen levothyroxine (SYNTHROID, LEVOTHROID) 88 MCG tablet TAKE ONE TABLET BY MOUTH EVERY DAY 90 tablet 1  . lisinopril (PRINIVIL,ZESTRIL) 20 MG tablet TAKE ONE TABLET BY MOUTH EVERY DAY. 90 tablet 0  . Multiple Vitamins-Minerals (ICAPS AREDS 2 PO) Take 1 tablet by mouth daily.    . nitroGLYCERIN (NITROSTAT)  0.4 MG SL tablet Place 1 tablet (0.4 mg total) under the tongue every 5 (five) minutes x 3 doses as needed for chest pain. 25 tablet 3  . sodium-potassium bicarbonate (ALKA-SELTZER GOLD) TBEF dissolvable tablet Take 1 tablet by mouth daily as needed (indigestion).     No facility-administered medications prior to visit.      Allergies:   Patient has no known allergies.   Social History   Socioeconomic History  . Marital status: Married    Spouse name: Not on file  . Number of children: Not on file  . Years of education: Not on file  . Highest education level: Not on file  Occupational History  . Not on file  Social Needs  . Financial resource strain: Not on file  . Food insecurity:    Worry: Not on file    Inability: Not on file  . Transportation needs:    Medical: Not on file    Non-medical: Not on file  Tobacco Use  . Smoking status: Never Smoker  . Smokeless tobacco: Never Used  Substance and Sexual Activity  . Alcohol use: No  . Drug use: No  . Sexual activity: Yes    Partners: Male  Lifestyle  . Physical activity:    Days per week: Not on file    Minutes per session: Not on file  . Stress: Not on file  Relationships  . Social connections:  Talks on phone: Not on file    Gets together: Not on file    Attends religious service: Not on file    Active member of club or organization: Not on file    Attends meetings of clubs or organizations: Not on file    Relationship status: Not on file  Other Topics Concern  . Not on file  Social History Narrative  . Not on file     Family History:  The patient's family history includes Breast cancer in her daughter; Cancer in her father and mother; Heart disease in her brother; Ovarian cancer in her sister.   ROS:   Please see the history of present illness.    ROS All other systems reviewed and are negative.   PHYSICAL EXAM:   VS:  BP (!) 150/80   Pulse 72   Ht 5\' 3"  (1.6 m)   Wt 123 lb (55.8 kg)   BMI 21.79  kg/m   Repeat BP 156/76 GEN: Well nourished, well developed, in no acute distress  HEENT: normal  Neck: no JVD, carotid bruits, or masses Cardiac: RRR; no murmurs, rubs, or gallops,no edema  Respiratory:  clear to auscultation bilaterally, normal work of breathing GI: soft, nontender, nondistended, + BS MS: no deformity or atrophy  Skin: warm and dry, no rash Neuro:  Alert and Oriented x 3, Strength and sensation are intact Psych: euthymic mood, full affect  Wt Readings from Last 3 Encounters:  07/21/18 123 lb (55.8 kg)  01/16/18 133 lb 6.4 oz (60.5 kg)  01/09/17 142 lb 12.8 oz (64.8 kg)      Studies/Labs Reviewed:   EKG:  EKG is ordered today.  The ekg ordered today demonstrates NSR with incomplete RBBB. I have personally reviewed and interpreted this study.   Recent Labs: 01/16/2018: Hemoglobin 14.9; Platelets 152.0; TSH 2.61 02/10/2018: ALT 88; BUN 25; Creatinine, Ser 0.86; Potassium 4.4; Sodium 141   Lipid Panel    Component Value Date/Time   CHOL 199 01/16/2018 1028   TRIG 88.0 01/16/2018 1028   HDL 94.60 01/16/2018 1028   CHOLHDL 2 01/16/2018 1028   VLDL 17.6 01/16/2018 1028   LDLCALC 86 01/16/2018 1028   LDLDIRECT 157.4 01/29/2011 0959    Additional studies/ records that were reviewed today include:  Echo 06/22/15: Study Conclusions  - Left ventricle: The cavity size was normal. Wall thickness was   increased in a pattern of moderate LVH. Systolic function was   normal. The estimated ejection fraction was in the range of 60%   to 65%. Wall motion was normal; there were no regional wall   motion abnormalities. Doppler parameters are consistent with   abnormal left ventricular relaxation (grade 1 diastolic   dysfunction). Doppler parameters are consistent with high   ventricular filling pressure. - Mitral valve: Moderately calcified annulus. Mildly thickened   leaflets . There was mild regurgitation. - Left atrium: The atrium was mildly dilated. - Pulmonary  arteries: Systolic pressure was mildly increased. PA   peak pressure: 36 mm Hg (S).  Myoview 09/05/17: MYOCARDIAL IMAGING WITH SPECT (REST AND PHARMACOLOGIC-STRESS)  GATED LEFT VENTRICULAR WALL MOTION STUDY  LEFT VENTRICULAR EJECTION FRACTION  TECHNIQUE: Standard myocardial SPECT imaging was performed after resting intravenous injection of 10 mCi Tc-60m sestamibi. Subsequently, intravenous infusion of Lexiscan was performed under the supervision of the Cardiology staff. At peak effect of the drug, 30 mCi Tc-73m sestamibi was injected intravenously and standard myocardial SPECT imaging was performed. Quantitative gated imaging was also performed to  evaluate left ventricular wall motion, and estimate left ventricular ejection fraction.  COMPARISON:  Chest x-ray 09/05/2015  FINDINGS: Perfusion: No decreased activity in the left ventricle on stress imaging to suggest reversible ischemia or infarction.  Wall Motion: Normal left ventricular wall motion. No left ventricular dilation.  Left Ventricular Ejection Fraction: 84 %  End diastolic volume 37 ml  End systolic volume 6 ml  IMPRESSION: 1. No reversible ischemia or infarction.  2. Normal left ventricular wall motion.  3. Left ventricular ejection fraction 84% which is likely exaggerated and due in part to small left ventricular volume.  4. Low-risk stress test findings*.  *2012 Appropriate Use Criteria for Coronary Revascularization Focused Update: J Am Coll Cardiol. 2012;59(9):857-881. http://content.dementiazones.com.aspx?articleid=1201161   Electronically Signed   By: Malachy Moan M.D.   On: 09/06/2015 14:44  ASSESSMENT:    1. Essential hypertension      PLAN:  In order of problems listed above:  1. BP is elevated today but prior readings have been good. Requested she monitor BP at home and keep diary. Confirm which BP medication she is on.  Follow up with Dr. Laury Axon.  2. Weight  loss. By our scales she has lost 10 lbs. Labs in March looked good. If continues needs to follow up with primary care.    Medication Adjustments/Labs and Tests Ordered: Current medicines are reviewed at length with the patient today.  Concerns regarding medicines are outlined above.  Medication changes, Labs and Tests ordered today are listed in the Patient Instructions below. Patient Instructions  Continue your current therapy  I will see you in one year.      Signed, Kaizen Ibsen [place], MD  07/21/2018 10:39 AM    [facility] [address], [place], [place], [address] [phone]

## 2018-07-21 ENCOUNTER — Ambulatory Visit: Payer: Medicare Other | Admitting: Cardiology

## 2018-07-21 ENCOUNTER — Encounter: Payer: Self-pay | Admitting: Cardiology

## 2018-07-21 VITALS — BP 150/80 | HR 72 | Ht 63.0 in | Wt 123.0 lb

## 2018-07-21 DIAGNOSIS — I10 Essential (primary) hypertension: Secondary | ICD-10-CM | POA: Diagnosis not present

## 2018-07-21 NOTE — Patient Instructions (Signed)
Continue your current therapy  I will see you in one year   

## 2018-08-04 ENCOUNTER — Ambulatory Visit: Payer: Medicare Other | Admitting: Family Medicine

## 2018-08-04 ENCOUNTER — Encounter: Payer: Self-pay | Admitting: Family Medicine

## 2018-08-04 VITALS — BP 138/80 | HR 77 | Temp 98.2°F | Resp 16 | Ht 63.0 in | Wt 120.8 lb

## 2018-08-04 DIAGNOSIS — Z23 Encounter for immunization: Secondary | ICD-10-CM | POA: Diagnosis not present

## 2018-08-04 DIAGNOSIS — I10 Essential (primary) hypertension: Secondary | ICD-10-CM

## 2018-08-04 DIAGNOSIS — R634 Abnormal weight loss: Secondary | ICD-10-CM | POA: Diagnosis not present

## 2018-08-04 DIAGNOSIS — K5901 Slow transit constipation: Secondary | ICD-10-CM

## 2018-08-04 DIAGNOSIS — E039 Hypothyroidism, unspecified: Secondary | ICD-10-CM

## 2018-08-04 DIAGNOSIS — F419 Anxiety disorder, unspecified: Secondary | ICD-10-CM | POA: Diagnosis not present

## 2018-08-04 LAB — GAMMA GT: GGT: 35 U/L (ref 7–51)

## 2018-08-04 LAB — CBC WITH DIFFERENTIAL/PLATELET
BASOS ABS: 0.1 10*3/uL (ref 0.0–0.1)
Basophils Relative: 0.7 % (ref 0.0–3.0)
Eosinophils Absolute: 0.1 10*3/uL (ref 0.0–0.7)
Eosinophils Relative: 1.3 % (ref 0.0–5.0)
HCT: 45.5 % (ref 36.0–46.0)
Hemoglobin: 15.3 g/dL — ABNORMAL HIGH (ref 12.0–15.0)
LYMPHS ABS: 1.8 10*3/uL (ref 0.7–4.0)
Lymphocytes Relative: 23.9 % (ref 12.0–46.0)
MCHC: 33.7 g/dL (ref 30.0–36.0)
MCV: 93.3 fl (ref 78.0–100.0)
MONOS PCT: 7.6 % (ref 3.0–12.0)
Monocytes Absolute: 0.6 10*3/uL (ref 0.1–1.0)
NEUTROS PCT: 66.5 % (ref 43.0–77.0)
Neutro Abs: 4.9 10*3/uL (ref 1.4–7.7)
Platelets: 165 10*3/uL (ref 150.0–400.0)
RBC: 4.88 Mil/uL (ref 3.87–5.11)
RDW: 13.3 % (ref 11.5–15.5)
WBC: 7.4 10*3/uL (ref 4.0–10.5)

## 2018-08-04 LAB — COMPREHENSIVE METABOLIC PANEL
ALK PHOS: 78 U/L (ref 39–117)
ALT: 21 U/L (ref 0–35)
AST: 19 U/L (ref 0–37)
Albumin: 4.3 g/dL (ref 3.5–5.2)
BILIRUBIN TOTAL: 0.7 mg/dL (ref 0.2–1.2)
BUN: 20 mg/dL (ref 6–23)
CO2: 31 mEq/L (ref 19–32)
Calcium: 10.4 mg/dL (ref 8.4–10.5)
Chloride: 102 mEq/L (ref 96–112)
Creatinine, Ser: 0.85 mg/dL (ref 0.40–1.20)
GFR: 66.82 mL/min (ref >60.00)
GLUCOSE: 92 mg/dL (ref 70–99)
Potassium: 4.6 mEq/L (ref 3.5–5.1)
SODIUM: 141 meq/L (ref 135–145)
TOTAL PROTEIN: 7.3 g/dL (ref 6.0–8.3)

## 2018-08-04 LAB — POC URINALSYSI DIPSTICK (AUTOMATED)
BILIRUBIN UA: NEGATIVE
GLUCOSE UA: NEGATIVE
Ketones, UA: NEGATIVE
Leukocytes, UA: NEGATIVE
Nitrite, UA: NEGATIVE
Protein, UA: NEGATIVE
RBC UA: NEGATIVE
SPEC GRAV UA: 1.02 (ref 1.010–1.025)
Urobilinogen, UA: 0.2 E.U./dL
pH, UA: 6 (ref 5.0–8.0)

## 2018-08-04 LAB — LIPID PANEL
Cholesterol: 220 mg/dL — ABNORMAL HIGH (ref 0–200)
HDL: 79 mg/dL (ref >39.00)
LDL CALC: 120 mg/dL — AB (ref 0–99)
NONHDL: 140.78
Total CHOL/HDL Ratio: 3
Triglycerides: 105 mg/dL (ref 0.0–149.0)
VLDL: 21 mg/dL (ref 0.0–40.0)

## 2018-08-04 LAB — TSH: TSH: 4.3 u[IU]/mL (ref 0.35–4.50)

## 2018-08-04 NOTE — Assessment & Plan Note (Signed)
Check labs due to weight loss

## 2018-08-04 NOTE — Assessment & Plan Note (Signed)
Pt refusing meds She is aware this may be contributing to her weight loss She will let us know if she changes her mind

## 2018-08-04 NOTE — Patient Instructions (Signed)
 High-Protein and High-Calorie Diet Eating high-protein and high-calorie foods can help you to gain weight, heal after an injury, and recover after an illness or surgery. What is my plan? The specific amount of daily protein and calories you need depends on:  Your body weight.  The reason this diet is recommended for you.  Generally, a high-protein, high-calorie diet involves:  Eating 250-500 extra calories each day.  Making sure that 10-35% of your daily calories come from protein.  Talk to your health care provider about how much protein and how many calories you need each day. Follow the diet as directed by your health care provider. What do I need to know about this diet?  Ask your health care provider if you should take a nutritional supplement.  Try to eat six small meals each day instead of three large meals.  Eat a balanced diet, including one food that is high in protein at each meal.  Keep nutritious snacks handy, such as nuts, trail mixes, dried fruit, and yogurt.  If you have kidney disease or diabetes, eating too much protein may put extra stress on your kidneys. Talk to your health care provider if you have either of those conditions. What are some high-protein foods? Grains Quinoa. Bulgur wheat. Vegetables Soybeans. Peas. Meats and Other Protein Sources Beef, pork, and poultry. Fish and seafood. Eggs. Tofu. Textured vegetable protein (TVP). Peanut butter. Nuts and seeds. Dried beans. Protein powders. Dairy Whole milk. Whole-milk yogurt. Powdered milk. Cheese. Cottage Cheese. Eggnog. Beverages High-protein supplement drinks. Soy milk. Other Protein bars. The items listed above may not be a complete list of recommended foods or beverages. Contact your dietitian for more options. What are some high-calorie foods? Grains Pasta. Quick breads. Muffins. Pancakes. Ready-to-eat cereal. Vegetables Vegetables cooked in oil or butter. Fried potatoes. Fruits Dried  fruit. Fruit leather. Canned fruit in syrup. Fruit juice. Avocados. Meats and Other Protein Sources Peanut butter. Nuts and seeds. Dairy Heavy cream. Whipped cream. Cream cheese. Sour cream. Ice cream. Custard. Pudding. Beverages Meal-replacement beverages. Nutrition shakes. Fruit juice. Sugar-sweetened soft drinks. Condiments Salad dressing. Mayonnaise. Alfredo sauce. Fruit preserves or jelly. Honey. Syrup. Sweets/Desserts Cake. Cookies. Pie. Pastries. Candy bars. Chocolate. Fats and Oils Butter or margarine. Oil. Gravy. Other Meal-replacement bars. The items listed above may not be a complete list of recommended foods or beverages. Contact your dietitian for more options. What are some tips for including high-protein and high-calorie foods in my diet?  Add whole milk, half-and-half, or heavy cream to cereal, pudding, soup, or hot cocoa.  Add whole milk to instant breakfast drinks.  Add peanut butter to oatmeal or smoothies.  Add powdered milk to baked goods, smoothies, or milkshakes.  Add powdered milk, cream, or butter to mashed potatoes.  Add cheese to cooked vegetables.  Make whole-milk yogurt parfaits. Top them with granola, fruit, or nuts.  Add cottage cheese to your fruit.  Add avocados, cheese, or both to sandwiches or salads.  Add meat, poultry, or seafood to rice, pasta, casseroles, salads, and soups.  Use mayonnaise when making egg salad, chicken salad, or tuna salad.  Use peanut butter as a topping for pretzels, celery, or crackers.  Add beans to casseroles, dips, and spreads.  Add pureed beans to sauces and soups.  Replace calorie-free drinks with calorie-containing drinks, such as milk and fruit juice. This information is not intended to replace advice given to you by your health care provider. Make sure you discuss any questions you have with your   health care provider. Document Released: 10/29/2005 Document Revised: 04/05/2016 Document Reviewed:  04/13/2014 Elsevier Interactive Patient Education  2018 [employer]  

## 2018-08-04 NOTE — Assessment & Plan Note (Signed)
Drink more water Can use colace / miralax prn F/u prn

## 2018-08-04 NOTE — Progress Notes (Signed)
Patient ID: [name] [name], female    DOB: [birth year]  Age: 84 y.o. MRN: [id number]    Subjective:  Subjective  HPI [name] [name] presents for weight loss.  She saw cardiology at the beginning of the month and had lost 10 lbs at that time--- she has lost 3 more pounds.  Pt mentions several times she is stressed-- she worries about everything and everyone.  She refuses medication though. Her appetite is decreased and she does not drink enough water---she complains of constipation Pt husband and daughter are here as well.  Review of Systems  Constitutional: Positive for unexpected weight change. Negative for chills and fever.  HENT: Negative for congestion and hearing loss.   Eyes: Negative for discharge.  Respiratory: Negative for cough and shortness of breath.   Cardiovascular: Negative for chest pain, palpitations and leg swelling.  Gastrointestinal: Negative for abdominal pain, blood in stool, constipation, diarrhea, nausea and vomiting.  Genitourinary: Negative for dysuria, frequency, hematuria and urgency.  Musculoskeletal: Negative for back pain and myalgias.  Skin: Negative for rash.  Allergic/Immunologic: Negative for environmental allergies.  Neurological: Negative for dizziness, weakness and headaches.  Hematological: Does not bruise/bleed easily.  Psychiatric/Behavioral: Negative for suicidal ideas. The patient is not nervous/anxious.     History Past Medical History:  Diagnosis Date  . Arthritis    "right thumb" (09/05/2015)  . Hyperlipidemia   . Hypertension   . Hypothyroidism   . Osteoporosis     She has a past surgical history that includes Tonsillectomy; Ovarian cyst surgery (1949); Cataract extraction w/ intraocular lens  implant, bilateral (Bilateral, 2010); and Closed reduction shoulder dislocation (Right).   Her family history includes Breast cancer in her daughter; Cancer in her father and mother; Heart disease in her brother; Ovarian cancer in her sister.She  reports that she has never smoked. She has never used smokeless tobacco. She reports that she does not drink alcohol or use drugs.  Current Outpatient Medications on File Prior to Visit  Medication Sig Dispense Refill  . Calcium Carbonate (CALTRATE 600) 1500 MG TABS Take 1 tablet by mouth daily.     Marland Kitchen levothyroxine (SYNTHROID, LEVOTHROID) 88 MCG tablet TAKE ONE TABLET BY MOUTH EVERY DAY 90 tablet 1  . lisinopril (PRINIVIL,ZESTRIL) 20 MG tablet TAKE ONE TABLET BY MOUTH EVERY DAY. 90 tablet 0  . Multiple Vitamins-Minerals (ICAPS AREDS 2 PO) Take 1 tablet by mouth daily.    . nitroGLYCERIN (NITROSTAT) 0.4 MG SL tablet Place 1 tablet (0.4 mg total) under the tongue every 5 (five) minutes x 3 doses as needed for chest pain. 25 tablet 3  . sodium-potassium bicarbonate (ALKA-SELTZER GOLD) TBEF dissolvable tablet Take 1 tablet by mouth daily as needed (indigestion).     No current facility-administered medications on file prior to visit.      Objective:  Objective  Physical Exam  Constitutional: She is oriented to person, place, and time. She appears well-developed and well-nourished.  HENT:  Head: Normocephalic and atraumatic.  Eyes: Conjunctivae and EOM are normal.  Neck: Normal range of motion. Neck supple. No JVD present. Carotid bruit is not present. No thyromegaly present.  Cardiovascular: Normal rate, regular rhythm and normal heart sounds.  No murmur heard. Pulmonary/Chest: Effort normal and breath sounds normal. No respiratory distress. She has no wheezes. She has no rales. She exhibits no tenderness.  Musculoskeletal: She exhibits no edema.  Neurological: She is alert and oriented to person, place, and time.  Psychiatric: Her speech is normal and behavior  is normal. Thought content normal. Her mood appears anxious. Cognition and memory are normal. She does not exhibit a depressed mood.  Nursing note and vitals reviewed.  BP 138/80 (BP Location: Left Arm, Cuff Size: Normal)   Pulse 77    Temp 98.2 F (36.8 C) (Oral)   Resp 16   Ht 5\' 3"  (1.6 m)   Wt 120 lb 12.8 oz (54.8 kg)   SpO2 94%   BMI 21.40 kg/m  Wt Readings from Last 3 Encounters:  08/04/18 120 lb 12.8 oz (54.8 kg)  07/21/18 123 lb (55.8 kg)  01/16/18 133 lb 6.4 oz (60.5 kg)     Lab Results  Component Value Date   WBC 8.2 01/16/2018   HGB 14.9 01/16/2018   HCT 45.3 01/16/2018   PLT 152.0 01/16/2018   GLUCOSE 99 02/10/2018   CHOL 199 01/16/2018   TRIG 88.0 01/16/2018   HDL 94.60 01/16/2018   LDLDIRECT 157.4 01/29/2011   LDLCALC 86 01/16/2018   ALT 88 (H) 02/10/2018   AST 57 (H) 02/10/2018   NA 141 02/10/2018   K 4.4 02/10/2018   CL 104 02/10/2018   CREATININE 0.86 02/10/2018   BUN 25 (H) 02/10/2018   CO2 30 02/10/2018   TSH 2.61 01/16/2018   INR 0.96 09/05/2015    US Abdomen Complete  Result Date: 02/21/2018 CLINICAL DATA:  Elevated LFTs EXAM: ABDOMEN ULTRASOUND COMPLETE COMPARISON:  None. FINDINGS: Gallbladder: Gallbladder is well distended with multiple echogenic foci without posterior acoustical shadowing consistent with small gallbladder polyps. No gallbladder wall thickening or pericholecystic fluid is noted. Common bile duct: Diameter: 2.6 mm. Liver: The liver demonstrates some mild heterogeneity particularly within the posterior aspect of the right lobe of the liver. There is a vague area of diffuse increased echogenicity likely representing focal fatty infiltration. No definitive mass is seen. Portal vein is patent on color Doppler imaging with normal direction of blood flow towards the liver. IVC: No abnormality visualized. Pancreas: Visualized portion unremarkable. Spleen: Size and appearance within normal limits. Right Kidney: Length: 9.5 cm. Echogenicity within normal limits. No mass or hydronephrosis visualized. Left Kidney: Length: 9.4 cm. Echogenicity within normal limits. No mass or hydronephrosis visualized. Abdominal aorta: No aneurysm visualized. Other findings: None. IMPRESSION:  Gallbladder polyps without complicating factors. Mild heterogeneity of the liver with what appears to be focal fatty infiltration of the posterior aspect of the right lobe of the liver. No discrete mass is seen. Electronically Signed   By: Alcide Clever M.D.   On: 02/21/2018 13:06     Assessment & Plan:  Plan  I am having Brenton Callas. Alfrey maintain her calcium carbonate, sodium-potassium bicarbonate, Multiple Vitamins-Minerals (ICAPS AREDS 2 PO), nitroGLYCERIN, levothyroxine, and lisinopril.  No orders of the defined types were placed in this encounter.   Problem List Items Addressed This Visit      Unprioritized   Anxiety    Pt refusing meds She is aware this may be contributing to her weight loss She will let us know if she changes her mind      Essential hypertension   Relevant Orders   Gamma GT   Lipid panel   CBC with Differential/Platelet   Comprehensive metabolic panel   TSH   POCT Urinalysis Dipstick (Automated)   Hypothyroidism    Check labs due to weight loss      Relevant Orders   Gamma GT   Lipid panel   CBC with Differential/Platelet   Comprehensive metabolic panel   TSH  POCT Urinalysis Dipstick (Automated)   Slow transit constipation    Drink more water Can use colace / miralax prn F/u prn       Weight decrease - Primary    Add ensure or boost inbetween meals  rto 1 month or sooner prn for weight check      Relevant Orders   Gamma GT   Lipid panel   CBC with Differential/Platelet   Comprehensive metabolic panel   TSH   POCT Urinalysis Dipstick (Automated)    Other Visit Diagnoses    Influenza vaccine administered       Relevant Orders   Flu vaccine HIGH DOSE PF (Fluzone High dose) (Completed)      Follow-up: Return in about 4 weeks (around 09/01/2018), or weight check .  Donato SchultzYvonne R Lowne Chase, DO

## 2018-08-04 NOTE — Assessment & Plan Note (Signed)
Add ensure or boost inbetween meals  rto 1 month or sooner prn for weight check

## 2018-08-07 DIAGNOSIS — H353132 Nonexudative age-related macular degeneration, bilateral, intermediate dry stage: Secondary | ICD-10-CM | POA: Diagnosis not present

## 2018-08-07 DIAGNOSIS — Z961 Presence of intraocular lens: Secondary | ICD-10-CM | POA: Diagnosis not present

## 2018-08-07 DIAGNOSIS — H04123 Dry eye syndrome of bilateral lacrimal glands: Secondary | ICD-10-CM | POA: Diagnosis not present

## 2018-08-07 DIAGNOSIS — H35033 Hypertensive retinopathy, bilateral: Secondary | ICD-10-CM | POA: Diagnosis not present

## 2018-10-21 ENCOUNTER — Encounter: Payer: Medicare Other | Admitting: Family Medicine

## 2019-01-09 ENCOUNTER — Encounter: Payer: Self-pay | Admitting: Family Medicine

## 2019-01-09 ENCOUNTER — Ambulatory Visit (INDEPENDENT_AMBULATORY_CARE_PROVIDER_SITE_OTHER): Payer: Medicare Other | Admitting: Family Medicine

## 2019-01-09 VITALS — BP 152/78 | HR 76 | Temp 97.6°F | Ht 63.0 in | Wt 126.0 lb

## 2019-01-09 DIAGNOSIS — Z23 Encounter for immunization: Secondary | ICD-10-CM

## 2019-01-09 DIAGNOSIS — R319 Hematuria, unspecified: Secondary | ICD-10-CM

## 2019-01-09 DIAGNOSIS — F419 Anxiety disorder, unspecified: Secondary | ICD-10-CM

## 2019-01-09 DIAGNOSIS — Z0000 Encounter for general adult medical examination without abnormal findings: Secondary | ICD-10-CM

## 2019-01-09 DIAGNOSIS — E039 Hypothyroidism, unspecified: Secondary | ICD-10-CM

## 2019-01-09 DIAGNOSIS — I10 Essential (primary) hypertension: Secondary | ICD-10-CM | POA: Diagnosis not present

## 2019-01-09 LAB — LIPID PANEL
CHOL/HDL RATIO: 3
Cholesterol: 221 mg/dL — ABNORMAL HIGH (ref 0–200)
HDL: 86.3 mg/dL (ref >39.00)
LDL Cholesterol: 119 mg/dL — ABNORMAL HIGH (ref 0–99)
NONHDL: 134.47
TRIGLYCERIDES: 78 mg/dL (ref 0.0–149.0)
VLDL: 15.6 mg/dL (ref 0.0–40.0)

## 2019-01-09 LAB — CBC WITH DIFFERENTIAL/PLATELET
Basophils Absolute: 0.1 10*3/uL (ref 0.0–0.1)
Basophils Relative: 0.9 % (ref 0.0–3.0)
Eosinophils Absolute: 0.1 10*3/uL (ref 0.0–0.7)
Eosinophils Relative: 1.3 % (ref 0.0–5.0)
HEMATOCRIT: 45.5 % (ref 36.0–46.0)
HEMOGLOBIN: 15.3 g/dL — AB (ref 12.0–15.0)
LYMPHS PCT: 16.3 % (ref 12.0–46.0)
Lymphs Abs: 1.3 10*3/uL (ref 0.7–4.0)
MCHC: 33.6 g/dL (ref 30.0–36.0)
MCV: 93 fl (ref 78.0–100.0)
MONO ABS: 0.5 10*3/uL (ref 0.1–1.0)
Monocytes Relative: 6.5 % (ref 3.0–12.0)
Neutro Abs: 5.8 10*3/uL (ref 1.4–7.7)
Neutrophils Relative %: 75 % (ref 43.0–77.0)
Platelets: 168 10*3/uL (ref 150.0–400.0)
RBC: 4.9 Mil/uL (ref 3.87–5.11)
RDW: 13.3 % (ref 11.5–15.5)
WBC: 7.7 10*3/uL (ref 4.0–10.5)

## 2019-01-09 LAB — COMPREHENSIVE METABOLIC PANEL
ALBUMIN: 4.3 g/dL (ref 3.5–5.2)
ALT: 28 U/L (ref 0–35)
AST: 26 U/L (ref 0–37)
Alkaline Phosphatase: 85 U/L (ref 39–117)
BUN: 21 mg/dL (ref 6–23)
CALCIUM: 10 mg/dL (ref 8.4–10.5)
CHLORIDE: 104 meq/L (ref 96–112)
CO2: 29 mEq/L (ref 19–32)
Creatinine, Ser: 0.82 mg/dL (ref 0.40–1.20)
GFR: 65.47 mL/min (ref >60.00)
Glucose, Bld: 86 mg/dL (ref 70–99)
POTASSIUM: 4.6 meq/L (ref 3.5–5.1)
Sodium: 142 mEq/L (ref 135–145)
Total Bilirubin: 0.6 mg/dL (ref 0.2–1.2)
Total Protein: 7 g/dL (ref 6.0–8.3)

## 2019-01-09 LAB — TSH: TSH: 23.56 u[IU]/mL — ABNORMAL HIGH (ref 0.35–4.50)

## 2019-01-09 LAB — POC URINALSYSI DIPSTICK (AUTOMATED)
Bilirubin, UA: NEGATIVE
Glucose, UA: NEGATIVE
Ketones, UA: NEGATIVE
LEUKOCYTES UA: NEGATIVE
Nitrite, UA: NEGATIVE
PH UA: 6.5 (ref 5.0–8.0)
PROTEIN UA: NEGATIVE
Spec Grav, UA: 1.015 (ref 1.010–1.025)
UROBILINOGEN UA: NEGATIVE U/dL — AB

## 2019-01-09 MED ORDER — LEVOTHYROXINE SODIUM 88 MCG PO TABS: 88.0000 ug | ORAL_TABLET | Freq: Every day | ORAL | 1 refills | Status: DC

## 2019-01-09 MED ORDER — LISINOPRIL 20 MG PO TABS: 20.0000 mg | ORAL_TABLET | Freq: Every day | ORAL | 1 refills | Status: DC

## 2019-01-09 MED ORDER — PNEUMOCOCCAL 13-VAL CONJ VACC IM SUSP
0.5000 mL | INTRAMUSCULAR | Status: AC
  Administered 2019-01-09: 0.5 mL via INTRAMUSCULAR

## 2019-01-09 MED ORDER — SERTRALINE HCL 50 MG PO TABS: 50.0000 mg | ORAL_TABLET | Freq: Every day | ORAL | 3 refills | Status: DC

## 2019-01-09 NOTE — Progress Notes (Signed)
-- Subjective:     [name] [name] is a 85 y.o. female and is here for a comprehensive physical exam. The patient reports increasing anxiety.  She has always refused meds but states she is ready to start something now.    Her daughter is with her today.    Social History   Socioeconomic History  . Marital status: Married    Spouse name: Not on file  . Number of children: Not on file  . Years of education: Not on file  . Highest education level: Not on file  Occupational History  . Not on file  Social Needs  . Financial resource strain: Not on file  . Food insecurity:    Worry: Not on file    Inability: Not on file  . Transportation needs:    Medical: Not on file    Non-medical: Not on file  Tobacco Use  . Smoking status: Never Smoker  . Smokeless tobacco: Never Used  Substance and Sexual Activity  . Alcohol use: No  . Drug use: No  . Sexual activity: Yes    Partners: Male  Lifestyle  . Physical activity:    Days per week: Not on file    Minutes per session: Not on file  . Stress: Not on file  Relationships  . Social connections:    Talks on phone: Not on file    Gets together: Not on file    Attends religious service: Not on file    Active member of club or organization: Not on file    Attends meetings of clubs or organizations: Not on file    Relationship status: Not on file  . Intimate partner violence:    Fear of current or ex partner: Not on file    Emotionally abused: Not on file    Physically abused: Not on file    Forced sexual activity: Not on file  Other Topics Concern  . Not on file  Social History Narrative  . Not on file   Health Maintenance  Topic Date Due  . DEXA SCAN  01/16/2019 (Originally 11/14/1993)  . TETANUS/TDAP  01/17/2019 (Originally 11/15/1945)  . PNA vac Low Risk Adult (1 of 2 - PCV13) 01/17/2019 (Originally 11/14/1993)  . INFLUENZA VACCINE  Completed    The following portions of the patient's history were reviewed and updated as  appropriate:  She  has a past medical history of Arthritis, Hyperlipidemia, Hypertension, Hypothyroidism, and Osteoporosis. She does not have any pertinent problems on file. She  has a past surgical history that includes Tonsillectomy; Ovarian cyst surgery (1949); Cataract extraction w/ intraocular lens  implant, bilateral (Bilateral, 2010); and Closed reduction shoulder dislocation (Right). Her family history includes Breast cancer in her daughter; Cancer in her father and mother; Heart disease in her brother; Ovarian cancer in her sister. She  reports that she has never smoked. She has never used smokeless tobacco. She reports that she does not drink alcohol or use drugs. She has a current medication list which includes the following prescription(s): calcium carbonate, levothyroxine, lisinopril, multiple vitamins-minerals, nitroglycerin, sodium-potassium bicarbonate, and sertraline. Current Outpatient Medications on File Prior to Visit  Medication Sig Dispense Refill  . Calcium Carbonate (CALTRATE 600) 1500 MG TABS Take 1 tablet by mouth daily.     . Multiple Vitamins-Minerals (ICAPS AREDS 2 PO) Take 1 tablet by mouth daily.    . nitroGLYCERIN (NITROSTAT) 0.4 MG SL tablet Place 1 tablet (0.4 mg total) under the tongue every 5 (five)  minutes x 3 doses as needed for chest pain. 25 tablet 3  . sodium-potassium bicarbonate (ALKA-SELTZER GOLD) TBEF dissolvable tablet Take 1 tablet by mouth daily as needed (indigestion).     No current facility-administered medications on file prior to visit.    She has No Known Allergies..  Review of Systems Review of Systems  Constitutional: Negative for activity change, appetite change and fatigue.  HENT: Negative for hearing loss, congestion, tinnitus and ear discharge.  dentist q59m Eyes: Negative for visual disturbance (see optho q1y -- vision corrected to 20/20 with glasses).  Respiratory: Negative for cough, chest tightness and shortness of breath.    Cardiovascular: Negative for chest pain, palpitations and leg swelling.  Gastrointestinal: Negative for abdominal pain, diarrhea, constipation and abdominal distention.  Genitourinary: Negative for urgency, frequency, decreased urine volume and difficulty urinating.  Musculoskeletal: Negative for back pain, arthralgias and gait problem.  Skin: Negative for color change, pallor and rash.  Neurological: Negative for dizziness, light-headedness, numbness and headaches.  Hematological: Negative for adenopathy. Does not bruise/bleed easily.  Psychiatric/Behavioral: Negative for suicidal ideas, confusion, sleep disturbance, self-injury, dysphoric mood, decreased concentration and agitation. + anxiety     Objective:    BP (!) 152/78   Pulse 76   Temp 97.6 F (36.4 C)   Ht  (1.6 m)   Wt 126 lb (57.2 kg)   SpO2 98%   BMI 22.32 kg/m  General appearance: alert, cooperative, appears stated age and no distress Head: Normocephalic, without obvious abnormality, atraumatic Eyes: conjunctivae/corneas clear. PERRL, EOM's intact. Fundi benign. Ears: normal TM's and external ear canals both ears Nose: Nares normal. Septum midline. Mucosa normal. No drainage or sinus tenderness. Throat: lips, mucosa, and tongue normal; teeth and gums normal Neck: no adenopathy, no carotid bruit, no JVD, supple, symmetrical, trachea midline and thyroid not enlarged, symmetric, no tenderness/mass/nodules Back: symmetric, no curvature. ROM normal. No CVA tenderness. Lungs: clear to auscultation bilaterally Breasts: normal appearance, no masses or tenderness Heart: regular rate and rhythm, S1, S2 normal, no murmur, click, rub or gallop Abdomen: soft, non-tender; bowel sounds normal; no masses,  no organomegaly Pelvic: not indicated; post-menopausal, no abnormal Pap smears in past Extremities: extremities normal, atraumatic, no cyanosis or edema Pulses: 2+ and symmetric Skin: Skin color, texture, turgor normal. No  rashes or lesions Lymph nodes: Cervical, supraclavicular, and axillary nodes normal. Neurologic: Alert and oriented X 3, normal strength and tone. Normal symmetric reflexes. Normal coordination and gait    Assessment:    Healthy female exam.      Plan:    ghm utd Check labs  See After Visit Summary for Counseling Recommendations    1. Anxiety Start zoloft  F/u 1 month - sertraline (ZOLOFT) 50 MG tablet; Take 1 tablet (50 mg total) by mouth daily.  Dispense: 30 tablet; Refill: 3 - TSH  2. Preventative health care See above  - pneumococcal 13-valent conjugate vaccine (PREVNAR 13) injection 0.5 mL  3. Hypothyroidism, unspecified type Stable--- check labs  - TSH - Comprehensive metabolic panel - POCT Urinalysis Dipstick (Automated) - levothyroxine (SYNTHROID, LEVOTHROID) 88 MCG tablet; Take 1 tablet (88 mcg total) by mouth daily.  Dispense: 90 tablet; Refill: 1  4. Essential hypertension Well controlled, no changes to meds. Encouraged heart healthy diet such as the DASH diet and exercise as tolerated.  - TSH - Lipid panel - CBC with Differential/Platelet - Comprehensive metabolic panel - POCT Urinalysis Dipstick (Automated) - lisinopril (PRINIVIL,ZESTRIL) 20 MG tablet; Take 1 tablet (20 mg  total) by mouth daily.  Dispense: 90 tablet; Refill: 1  5. Hematuria, unspecified type  - Urine Culture

## 2019-01-09 NOTE — Patient Instructions (Addendum)
zoloft---  Take a half a pill for 8 days then inc to 1 whole pill      DASH Eating Plan DASH stands for "Dietary Approaches to Stop Hypertension." The DASH eating plan is a healthy eating plan that has been shown to reduce high blood pressure (hypertension). It may also reduce your risk for type 2 diabetes, heart disease, and stroke. The DASH eating plan may also help with weight loss. What are tips for following this plan?  General guidelines  Avoid eating more than 2,300 mg (milligrams) of salt (sodium) a day. If you have hypertension, you may need to reduce your sodium intake to 1,500 mg a day.  Limit alcohol intake to no more than 1 drink a day for nonpregnant women and 2 drinks a day for men. One drink equals 12 oz of beer, 5 oz of wine, or 1 oz of hard liquor.  Work with your health care provider to maintain a healthy body weight or to lose weight. Ask what an ideal weight is for you.  Get at least 30 minutes of exercise that causes your heart to beat faster (aerobic exercise) most days of the week. Activities may include walking, swimming, or biking.  Work with your health care provider or diet and nutrition specialist (dietitian) to adjust your eating plan to your individual calorie needs. Reading food labels   Check food labels for the amount of sodium per serving. Choose foods with less than 5 percent of the Daily Value of sodium. Generally, foods with less than 300 mg of sodium per serving fit into this eating plan.  To find whole grains, look for the word "whole" as the first word in the ingredient list. Shopping  Buy products labeled as "low-sodium" or "no salt added."  Buy fresh foods. Avoid canned foods and premade or frozen meals. Cooking  Avoid adding salt when cooking. Use salt-free seasonings or herbs instead of table salt or sea salt. Check with your health care provider or pharmacist before using salt substitutes.  Do not fry foods. Cook foods using healthy  methods such as baking, boiling, grilling, and broiling instead.  Cook with heart-healthy oils, such as olive, canola, soybean, or sunflower oil. Meal planning  Eat a balanced diet that includes: ? 5 or more servings of fruits and vegetables each day. At each meal, try to fill half of your plate with fruits and vegetables. ? Up to 6-8 servings of whole grains each day. ? Less than 6 oz of lean meat, poultry, or fish each day. A 3-oz serving of meat is about the same size as a deck of cards. One egg equals 1 oz. ? 2 servings of low-fat dairy each day. ? A serving of nuts, seeds, or beans 5 times each week. ? Heart-healthy fats. Healthy fats called Omega-3 fatty acids are found in foods such as flaxseeds and coldwater fish, like sardines, salmon, and mackerel.  Limit how much you eat of the following: ? Canned or prepackaged foods. ? Food that is high in trans fat, such as fried foods. ? Food that is high in saturated fat, such as fatty meat. ? Sweets, desserts, sugary drinks, and other foods with added sugar. ? Full-fat dairy products.  Do not salt foods before eating.  Try to eat at least 2 vegetarian meals each week.  Eat more home-cooked food and less restaurant, buffet, and fast food.  When eating at a restaurant, ask that your food be prepared with less salt or no  salt, if possible. What foods are recommended? The items listed may not be a complete list. Talk with your dietitian about what dietary choices are best for you. Grains Whole-grain or whole-wheat bread. Whole-grain or whole-wheat pasta. Brown rice. [place]. Bulgur. Whole-grain and low-sodium cereals. Pita bread. Low-fat, low-sodium crackers. Whole-wheat flour tortillas. Vegetables Fresh or frozen vegetables (raw, steamed, roasted, or grilled). Low-sodium or reduced-sodium tomato and vegetable juice. Low-sodium or reduced-sodium tomato sauce and tomato paste. Low-sodium or reduced-sodium canned  vegetables. Fruits All fresh, dried, or frozen fruit. Canned fruit in natural juice (without added sugar). Meat and other protein foods Skinless chicken or [place]. Ground chicken or [place]. Pork with fat trimmed off. Fish and seafood. Egg whites. Dried beans, peas, or lentils. Unsalted nuts, nut butters, and seeds. Unsalted canned beans. Lean cuts of beef with fat trimmed off. Low-sodium, lean deli meat. Dairy Low-fat (1%) or fat-free (skim) milk. Fat-free, low-fat, or reduced-fat cheeses. Nonfat, low-sodium ricotta or cottage cheese. Low-fat or nonfat yogurt. Low-fat, low-sodium cheese. Fats and oils Soft margarine without trans fats. Vegetable oil. Low-fat, reduced-fat, or light mayonnaise and salad dressings (reduced-sodium). Canola, safflower, olive, soybean, and sunflower oils. Avocado. Seasoning and other foods Herbs. Spices. Seasoning mixes without salt. Unsalted popcorn and pretzels. Fat-free sweets. What foods are not recommended? The items listed may not be a complete list. Talk with your dietitian about what dietary choices are best for you. Grains Baked goods made with fat, such as croissants, muffins, or some breads. Dry pasta or rice meal packs. Vegetables Creamed or fried vegetables. Vegetables in a cheese sauce. Regular canned vegetables (not low-sodium or reduced-sodium). Regular canned tomato sauce and paste (not low-sodium or reduced-sodium). Regular tomato and vegetable juice (not low-sodium or reduced-sodium). Angie Fava. Olives. Fruits Canned fruit in a light or heavy syrup. Fried fruit. Fruit in cream or butter sauce. Meat and other protein foods Fatty cuts of meat. Ribs. Fried meat. Berniece Salines. Sausage. Bologna and other processed lunch meats. Salami. Fatback. Hotdogs. Bratwurst. Salted nuts and seeds. Canned beans with added salt. Canned or smoked fish. Whole eggs or egg yolks. Chicken or [place] with skin. Dairy Whole or 2% milk, cream, and half-and-half. Whole or full-fat  cream cheese. Whole-fat or sweetened yogurt. Full-fat cheese. Nondairy creamers. Whipped toppings. Processed cheese and cheese spreads. Fats and oils Butter. Stick margarine. Lard. Shortening. Ghee. Bacon fat. Tropical oils, such as coconut, palm kernel, or palm oil. Seasoning and other foods Salted popcorn and pretzels. Onion salt, garlic salt, seasoned salt, table salt, and sea salt. Worcestershire sauce. Tartar sauce. Barbecue sauce. Teriyaki sauce. Soy sauce, including reduced-sodium. Steak sauce. Canned and packaged gravies. Fish sauce. Oyster sauce. Cocktail sauce. Horseradish that you find on the shelf. Ketchup. Mustard. Meat flavorings and tenderizers. Bouillon cubes. Hot sauce and Tabasco sauce. Premade or packaged marinades. Premade or packaged taco seasonings. Relishes. Regular salad dressings. Where to find more information:  National Heart, Lung, and [place]: https://wilson-eaton.com/  American Heart Association: www.heart.org Summary  The DASH eating plan is a healthy eating plan that has been shown to reduce high blood pressure (hypertension). It may also reduce your risk for type 2 diabetes, heart disease, and stroke.  With the DASH eating plan, you should limit salt (sodium) intake to 2,300 mg a day. If you have hypertension, you may need to reduce your sodium intake to 1,500 mg a day.  When on the DASH eating plan, aim to eat more fresh fruits and vegetables, whole grains, lean proteins, low-fat dairy, and heart-healthy  fats.  Work with your health care provider or diet and nutrition specialist (dietitian) to adjust your eating plan to your individual calorie needs. This information is not intended to replace advice given to you by your health care provider. Make sure you discuss any questions you have with your health care provider. Document Released: 10/18/2011 Document Revised: 10/22/2016 Document Reviewed: 10/22/2016 Elsevier Interactive Patient Education  2019 [employer].

## 2019-01-10 ENCOUNTER — Encounter: Payer: Self-pay | Admitting: Family Medicine

## 2019-01-10 LAB — URINE CULTURE
MICRO NUMBER:: 256607
SPECIMEN QUALITY:: ADEQUATE

## 2019-01-23 ENCOUNTER — Other Ambulatory Visit: Payer: Self-pay | Admitting: Family Medicine

## 2019-01-23 MED ORDER — LEVOTHYROXINE SODIUM 100 MCG PO TABS: 100.0000 ug | ORAL_TABLET | Freq: Every day | ORAL | 2 refills | Status: DC

## 2019-01-23 NOTE — Telephone Encounter (Signed)
Sent Rx synthroid 100 mcg  #30--Refills 2 ---[facility].

## 2019-02-05 ENCOUNTER — Telehealth: Payer: Self-pay | Admitting: *Deleted

## 2019-02-05 NOTE — Telephone Encounter (Signed)
Copied from CRM [id number]. Topic: General - Inquiry >> Feb 05, 2019  8:52 AM Terisa Starr wrote: Reason for CRM: patient's daughter , Elnita Maxwell called and canceled appointment for 3/31 and would like to know how important was this follow. Call back @ [phone]

## 2019-02-06 NOTE — Telephone Encounter (Signed)
Daughter will call in when covid 19 blows over to reschedule appointment

## 2019-02-10 ENCOUNTER — Ambulatory Visit: Payer: Medicare Other | Admitting: Family Medicine

## 2019-03-23 ENCOUNTER — Telehealth: Payer: Self-pay | Admitting: *Deleted

## 2019-03-23 ENCOUNTER — Other Ambulatory Visit: Payer: Self-pay | Admitting: *Deleted

## 2019-03-23 DIAGNOSIS — E039 Hypothyroidism, unspecified: Secondary | ICD-10-CM

## 2019-03-23 NOTE — Telephone Encounter (Signed)
Copied from CRM [id number]. Topic: Appointment Scheduling - Scheduling Inquiry for Clinic >> Mar 23, 2019 10:20 AM Gwenlyn Fudge A wrote: Reason for CRM: Pts daughter called stating she was afraid to bring in pt for labs on 03/25/2019. She is requesting advice on wether it is okay to cancel appt or to bring pt in. Please advise.

## 2019-03-23 NOTE — Telephone Encounter (Signed)
Daughter will call back to reschedule lab for tsh.

## 2019-03-25 ENCOUNTER — Other Ambulatory Visit: Payer: Medicare Other

## 2019-04-03 ENCOUNTER — Other Ambulatory Visit: Payer: Self-pay | Admitting: Family Medicine

## 2019-04-03 DIAGNOSIS — F419 Anxiety disorder, unspecified: Secondary | ICD-10-CM

## 2019-04-21 ENCOUNTER — Other Ambulatory Visit: Payer: Self-pay | Admitting: Family Medicine

## 2019-05-07 ENCOUNTER — Other Ambulatory Visit (INDEPENDENT_AMBULATORY_CARE_PROVIDER_SITE_OTHER): Payer: Medicare Other

## 2019-05-07 ENCOUNTER — Other Ambulatory Visit: Payer: Self-pay

## 2019-05-07 DIAGNOSIS — E039 Hypothyroidism, unspecified: Secondary | ICD-10-CM

## 2019-05-07 LAB — TSH: TSH: 4.08 u[IU]/mL (ref 0.35–4.50)

## 2019-06-03 ENCOUNTER — Encounter: Payer: Self-pay | Admitting: Podiatry

## 2019-06-03 ENCOUNTER — Ambulatory Visit (INDEPENDENT_AMBULATORY_CARE_PROVIDER_SITE_OTHER): Payer: Medicare Other | Admitting: Podiatry

## 2019-06-03 ENCOUNTER — Other Ambulatory Visit: Payer: Self-pay

## 2019-06-03 DIAGNOSIS — M79675 Pain in left toe(s): Secondary | ICD-10-CM

## 2019-06-03 DIAGNOSIS — M79674 Pain in right toe(s): Secondary | ICD-10-CM

## 2019-06-03 DIAGNOSIS — B351 Tinea unguium: Secondary | ICD-10-CM | POA: Diagnosis not present

## 2019-06-03 NOTE — Patient Instructions (Signed)

## 2019-06-07 NOTE — Progress Notes (Signed)
Subjective:  [name] [name] presents to clinic today with cc of  painful, thick, discolored, elongated toenails 1-5 b/l that become tender and cannot cut because of thickness. Pain is aggravated when wearing enclosed shoe gear.  She is complaining of left hallux pain on today's visit. She denies any redness, drainage or swelling.  [name] Held, DO is her PCP.   Current Outpatient Medications:  .  Calcium Carbonate (CALTRATE 600) 1500 MG TABS, Take 1 tablet by mouth daily. , Disp: , Rfl:  .  levothyroxine (SYNTHROID) 100 MCG tablet, TAKE ONE TABLET (100 MCG TOTAL) BY MOUTHDAILY, Disp: 30 tablet, Rfl: 2 .  lisinopril (PRINIVIL,ZESTRIL) 20 MG tablet, Take 1 tablet (20 mg total) by mouth daily., Disp: 90 tablet, Rfl: 1 .  Multiple Vitamins-Minerals (ICAPS AREDS 2 PO), Take 1 tablet by mouth daily., Disp: , Rfl:  .  nitroGLYCERIN (NITROSTAT) 0.4 MG SL tablet, Place 1 tablet (0.4 mg total) under the tongue every 5 (five) minutes x 3 doses as needed for chest pain., Disp: 25 tablet, Rfl: 3 .  sertraline (ZOLOFT) 50 MG tablet, TAKE 1 TABLET BY MOUTH EVERY DAY, Disp: 90 tablet, Rfl: 0 .  sodium-potassium bicarbonate (ALKA-SELTZER GOLD) TBEF dissolvable tablet, Take 1 tablet by mouth daily as needed (indigestion)., Disp: , Rfl:    No Known Allergies   Objective: Physical Examination:  Vascular Examination: Capillary refill time immediate x 10 digits.  Palpable DP/PT pulses b/l.  Digital hair sparse b/l.  No edema noted b/l.  Skin temperature gradient WNL b/l.  Dermatological Examination: Skin with normal turgor, texture and tone b/l.  No open wounds b/l.  No interdigital macerations noted b/l.  Elongated, thick, discolored brittle toenails with subungual debris and pain on dorsal palpation of nailbeds 1-5 b/l. Incurvated nailplate left great toe b/l borders with tenderness to palpation. No erythema, no edema, no drainage noted.  Musculoskeletal Examination: Muscle strength  5/5 to all muscle groups b/l.  No pain, crepitus or joint discomfort with active/passive ROM.  Neurological Examination: Sensation intact 5/5 b/l with 10 gram monofilament.  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 Toenails 1-5 b/l were debrided in length and girth without iatrogenic laceration.  Offending nail border debrided and curretaged left hallux. Border cleansed with alcohol and tripe antibiotic applied. No further treatment required by patient/cargiver. 2.  Continue soft, supportive shoe gear daily. 3.  Report any pedal injuries to medical professional. 4.  Follow up 3 months. 5.  Patient/POA to call should there be a question/concern in there interim.

## 2019-06-08 ENCOUNTER — Telehealth: Payer: Self-pay | Admitting: Cardiology

## 2019-06-08 NOTE — Telephone Encounter (Signed)
LVM for patient to call and schedule 1 yr followup with Dr. Jordan. °

## 2019-06-10 ENCOUNTER — Other Ambulatory Visit: Payer: Self-pay | Admitting: Family Medicine

## 2019-06-16 DIAGNOSIS — D1801 Hemangioma of skin and subcutaneous tissue: Secondary | ICD-10-CM | POA: Diagnosis not present

## 2019-06-16 DIAGNOSIS — L814 Other melanin hyperpigmentation: Secondary | ICD-10-CM | POA: Diagnosis not present

## 2019-06-16 DIAGNOSIS — L821 Other seborrheic keratosis: Secondary | ICD-10-CM | POA: Diagnosis not present

## 2019-06-16 DIAGNOSIS — D229 Melanocytic nevi, unspecified: Secondary | ICD-10-CM | POA: Diagnosis not present

## 2019-07-04 ENCOUNTER — Other Ambulatory Visit: Payer: Self-pay | Admitting: Family Medicine

## 2019-07-04 DIAGNOSIS — F419 Anxiety disorder, unspecified: Secondary | ICD-10-CM

## 2019-07-17 ENCOUNTER — Other Ambulatory Visit: Payer: Self-pay | Admitting: Family Medicine

## 2019-07-17 DIAGNOSIS — I10 Essential (primary) hypertension: Secondary | ICD-10-CM

## 2019-08-21 ENCOUNTER — Ambulatory Visit (INDEPENDENT_AMBULATORY_CARE_PROVIDER_SITE_OTHER): Payer: Medicare Other

## 2019-08-21 ENCOUNTER — Other Ambulatory Visit: Payer: Self-pay

## 2019-08-21 DIAGNOSIS — Z23 Encounter for immunization: Secondary | ICD-10-CM | POA: Diagnosis not present

## 2019-09-22 ENCOUNTER — Ambulatory Visit (INDEPENDENT_AMBULATORY_CARE_PROVIDER_SITE_OTHER): Payer: Medicare Other | Admitting: Podiatry

## 2019-09-22 ENCOUNTER — Encounter: Payer: Self-pay | Admitting: Podiatry

## 2019-09-22 ENCOUNTER — Other Ambulatory Visit: Payer: Self-pay

## 2019-09-22 DIAGNOSIS — M79672 Pain in left foot: Secondary | ICD-10-CM

## 2019-09-22 DIAGNOSIS — B351 Tinea unguium: Secondary | ICD-10-CM

## 2019-09-22 DIAGNOSIS — M79675 Pain in left toe(s): Secondary | ICD-10-CM

## 2019-09-22 DIAGNOSIS — L84 Corns and callosities: Secondary | ICD-10-CM

## 2019-09-22 DIAGNOSIS — M79674 Pain in right toe(s): Secondary | ICD-10-CM

## 2019-09-22 NOTE — Patient Instructions (Signed)

## 2019-09-27 NOTE — Progress Notes (Signed)
Subjective: KAYDEE MAGEL presents to clinic with cc of painful mycotic toenails and callus left foot which are aggravated when weightbearing with and without shoe gear.  This pain limits her daily activities. Pain symptoms resolve with periodic professional debridement.  She voices no new pedal problems on today's visit.  Current Outpatient Medications on File Prior to Visit  Medication Sig Dispense Refill  . Calcium Carbonate (CALTRATE 600) 1500 MG TABS Take 1 tablet by mouth daily.     Marland Kitchen levothyroxine (SYNTHROID) 100 MCG tablet TAKE ONE TABLET (100MCG TOTAL) BY MOUTH DAILY 90 tablet 1  . lisinopril (ZESTRIL) 20 MG tablet TAKE ONE TABLET (20 MG TOTAL) BY MOUTH DAILY 90 tablet 1  . Multiple Vitamins-Minerals (ICAPS AREDS 2 PO) Take 1 tablet by mouth daily.    . nitroGLYCERIN (NITROSTAT) 0.4 MG SL tablet Place 1 tablet (0.4 mg total) under the tongue every 5 (five) minutes x 3 doses as needed for chest pain. 25 tablet 3  . sertraline (ZOLOFT) 50 MG tablet TAKE 1 TABLET BY MOUTH EVERY DAY 90 tablet 1  . sodium-potassium bicarbonate (ALKA-SELTZER GOLD) TBEF dissolvable tablet Take 1 tablet by mouth daily as needed (indigestion).     No current facility-administered medications on file prior to visit.      No Known Allergies   Objective: There were no vitals filed for this visit.  Physical Examination:  Vascular  Examination: Capillary refill time immediate x 10 digits.  Palpable DP/PT pulses b/l.  Digital hair sparse.   Skin temperature gradient WNL b/l.  Dermatological Examination: Skin with normal turgor, texture and tone b/l.  Elongated, thick, discolored brittle toenails with subungual debris and pain on dorsal palpation of nailbeds 1-5 b/l.  Hyperkeratotic lesion submet head 1 left foot  with tenderness to palpation. No edema, no erythema, no drainage, no flocculence.   Musculoskeletal Examination: Muscle strength 5/5 to all muscle groups b/l.  No pain, crepitus or  joint discomfort with active/passive ROM.  Neurological Examination: Sensation intact 5/5 b/l with 10 gram monofilament.  Assessment: 1. 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 Callus submet head 1 left foot 3. Pain in foot  Plan: 1. Toenails 1-5 b/l were debrided in length and girth without iatrogenic laceration. 2. Calluses pared submetatarsal head 1 left foot utilizing sterile scalpel blade. 3. Continue soft, supportive shoe gear daily. 4. Report any pedal injuries to medical professional. 5. Follow up 9 weeks. 6. Patient/POA to call should there be a question/concern in there interim.

## 2019-10-22 ENCOUNTER — Other Ambulatory Visit: Payer: Self-pay | Admitting: Family Medicine

## 2019-12-08 ENCOUNTER — Other Ambulatory Visit: Payer: Self-pay

## 2019-12-08 ENCOUNTER — Encounter: Payer: Self-pay | Admitting: Podiatry

## 2019-12-08 ENCOUNTER — Ambulatory Visit (INDEPENDENT_AMBULATORY_CARE_PROVIDER_SITE_OTHER): Payer: Medicare Other | Admitting: Podiatry

## 2019-12-08 DIAGNOSIS — L84 Corns and callosities: Secondary | ICD-10-CM

## 2019-12-08 DIAGNOSIS — L600 Ingrowing nail: Secondary | ICD-10-CM

## 2019-12-08 DIAGNOSIS — M79675 Pain in left toe(s): Secondary | ICD-10-CM | POA: Diagnosis not present

## 2019-12-08 DIAGNOSIS — M79674 Pain in right toe(s): Secondary | ICD-10-CM | POA: Diagnosis not present

## 2019-12-08 DIAGNOSIS — B351 Tinea unguium: Secondary | ICD-10-CM | POA: Diagnosis not present

## 2019-12-08 NOTE — Patient Instructions (Signed)

## 2019-12-08 NOTE — Progress Notes (Signed)
Subjective: [name] [name] presents today for follow up of callus(es) b/l feet and painful mycotic toenails b/l that are difficult to trim. Pain interferes with ambulation. Aggravating factors include wearing enclosed shoe gear. Pain is relieved with periodic professional debridement..   She states her left great toe is sore on today's visit. Denies any swelling, redness or drainage.  No Known Allergies   Objective: There were no vitals filed for this visit.  Vascular Examination:  capillary refill time to digits immediate b/l, palpable DP pulses b/l, palpable PT pulses b/l, pedal hair sparse b/l and skin temperature gradient within normal limits b/l  Dermatological Examination: Pedal skin with normal turgor, texture and tone bilaterally, no open wounds bilaterally, no interdigital macerations bilaterally, toenails 1-5 b/l elongated, dystrophic, thickened, crumbly with subungual debris and hyperkeratotic lesion(s) submet head 1 b/l and medial right hallux.  No erythema, no edema, no drainage, no flocculence.  Incurvated nailplate left great toe lateral border(s) with tenderness to palpation. No erythema, no edema, no drainage noted.  Musculoskeletal: normal muscle strength 5/5 to all lower extremity muscle groups bilaterally, no gross bony deformities bilaterally and no pain crepitus or joint limitation noted with ROM b/l  Neurological: sensation intact 5/5 intact bilaterally with 10g monofilament b/l and vibratory sensation intact b/l  Assessment: 1. Pain due to onychomycosis of toenails of both feet   2. Ingrown toenail without infection   3. Callus      Plan: -Toenails 1-5 b/l were debrided in length and girth without iatrogenic bleeding. Offending nail border debrided and curretaged left hallux. Border cleansed with alcohol and triple antibiotic applied. No further treatment required by patient/caregiver. -The calluses submet head 1 b/l and right hallux were debrided without  complication or incident. Total number debrided =3, submet head 1 b/l and medial right hallux. -Patient to continue soft, supportive shoe gear daily. -Patient to report any pedal injuries to medical professional immediately. -Patient/POA to call should there be question/concern in the interim.  Return in about 9 weeks (around 02/09/2020) for nail and callus trim.

## 2019-12-15 ENCOUNTER — Ambulatory Visit: Payer: Medicare Other | Admitting: Podiatry

## 2019-12-17 DIAGNOSIS — Z961 Presence of intraocular lens: Secondary | ICD-10-CM | POA: Diagnosis not present

## 2019-12-17 DIAGNOSIS — H353132 Nonexudative age-related macular degeneration, bilateral, intermediate dry stage: Secondary | ICD-10-CM | POA: Diagnosis not present

## 2019-12-17 DIAGNOSIS — H04123 Dry eye syndrome of bilateral lacrimal glands: Secondary | ICD-10-CM | POA: Diagnosis not present

## 2019-12-17 DIAGNOSIS — H35033 Hypertensive retinopathy, bilateral: Secondary | ICD-10-CM | POA: Diagnosis not present

## 2019-12-18 ENCOUNTER — Encounter: Payer: Self-pay | Admitting: Family Medicine

## 2019-12-31 ENCOUNTER — Telehealth: Payer: Self-pay | Admitting: Family Medicine

## 2019-12-31 NOTE — Telephone Encounter (Signed)
Called patient to schedule AWV, no answer. Will try to call patient back at a later time. SF °

## 2020-01-08 ENCOUNTER — Other Ambulatory Visit: Payer: Self-pay | Admitting: Family Medicine

## 2020-01-28 IMAGING — US US ABDOMEN COMPLETE
1 series · 13 of 25 positions shown · non-contrast
Comparison: None.

CLINICAL DATA: Elevated LFTs

EXAM:
ABDOMEN ULTRASOUND COMPLETE

[Series 1: us abdomen complete · 0.24mm/px · 13 of 127 slices shown]
[im 1/127]
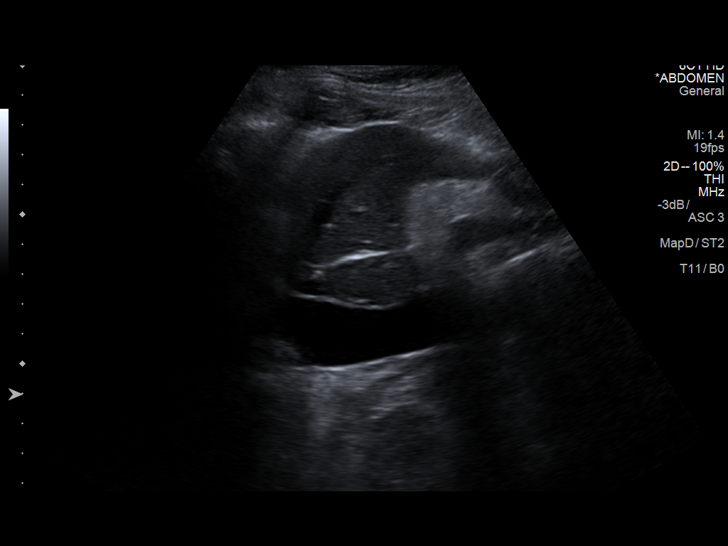
[im 11/127]
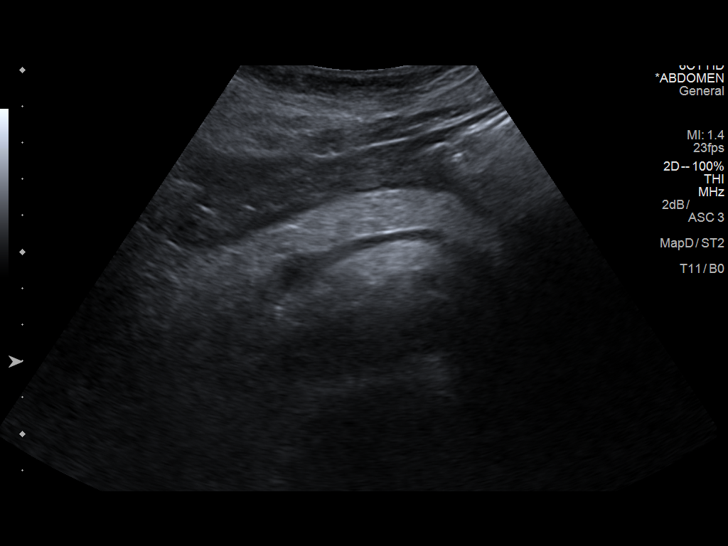
[im 22/127]
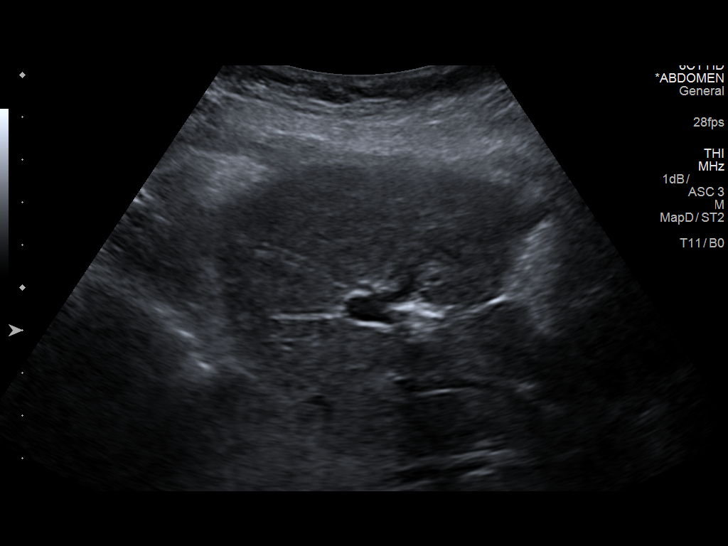
[im 32/127]
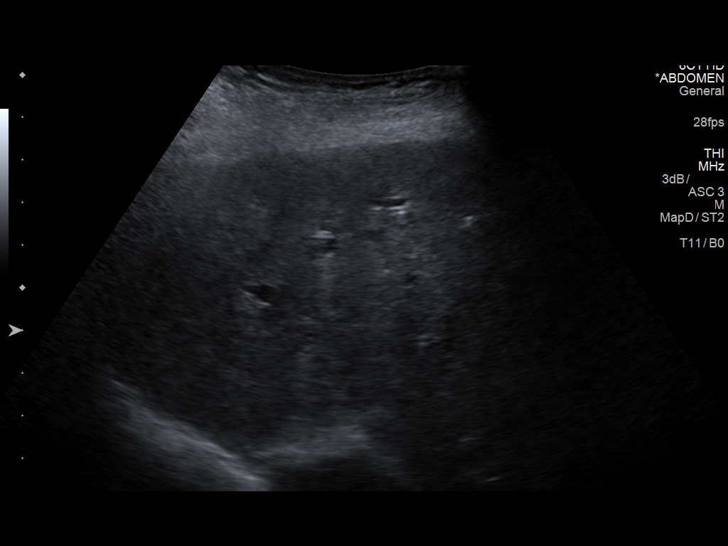
[im 43/127]
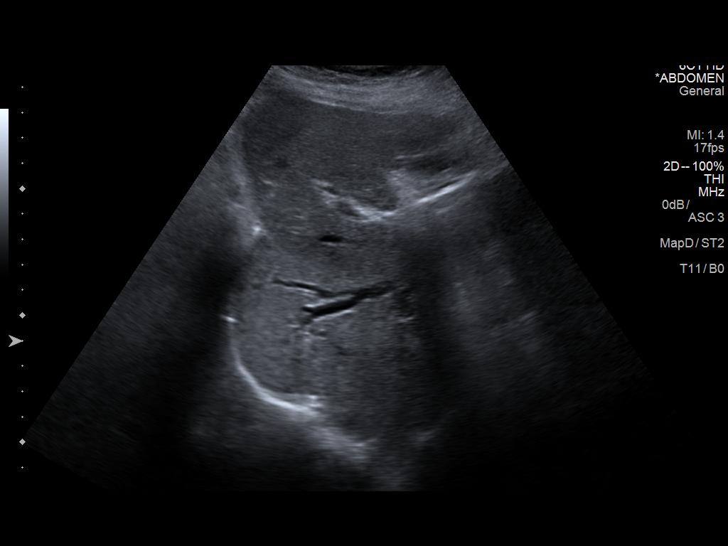
[im 53/127]
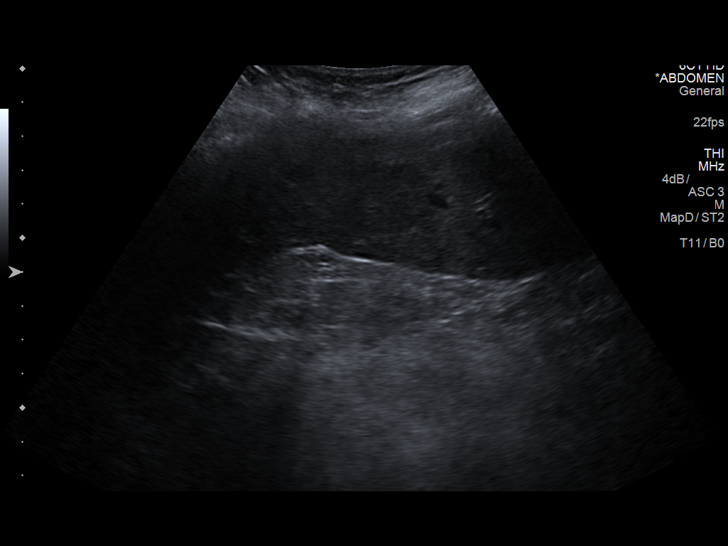
[im 64/127]
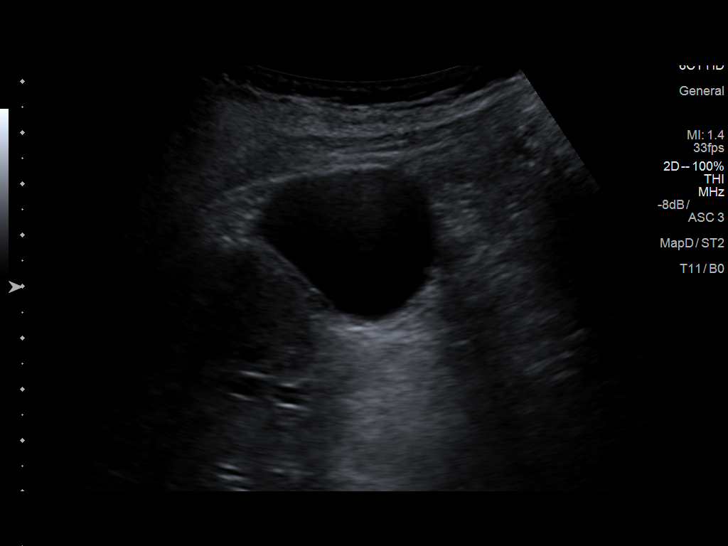
[im 74/127]
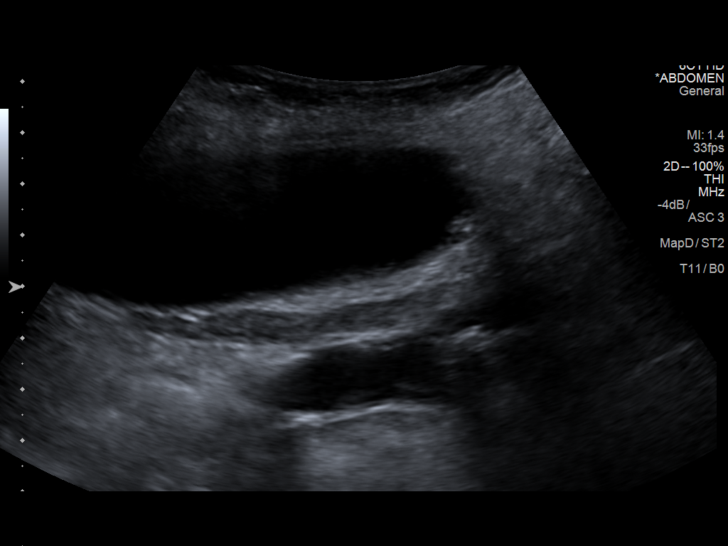
[im 85/127]
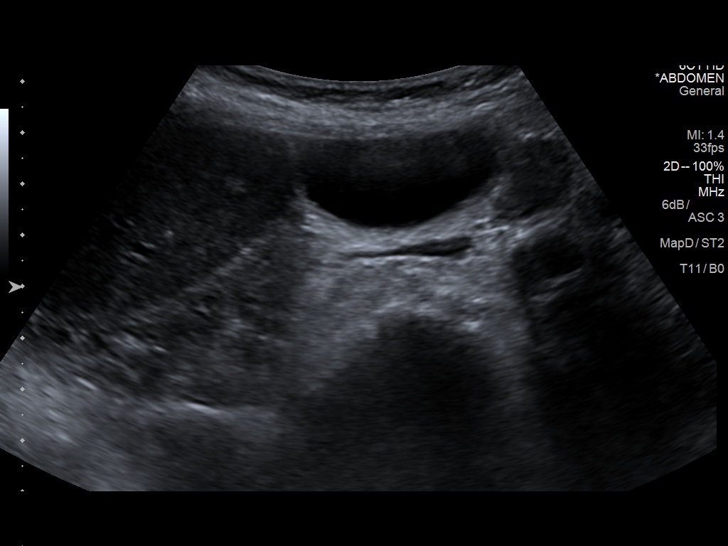
[im 95/127]
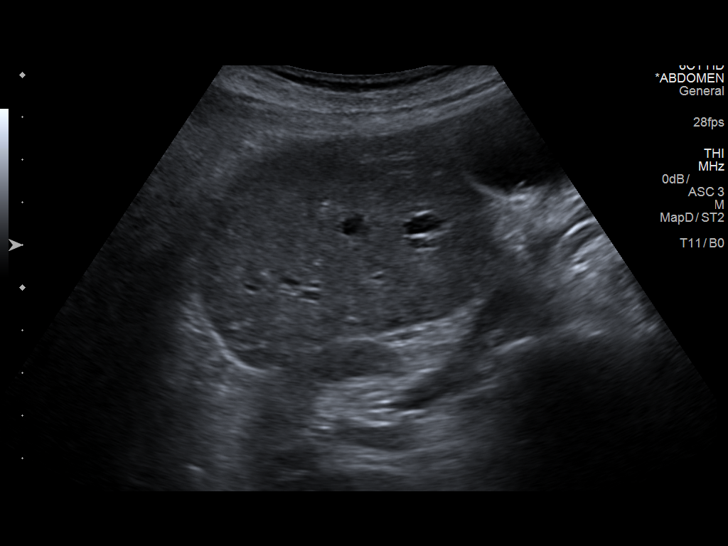
[im 106/127]
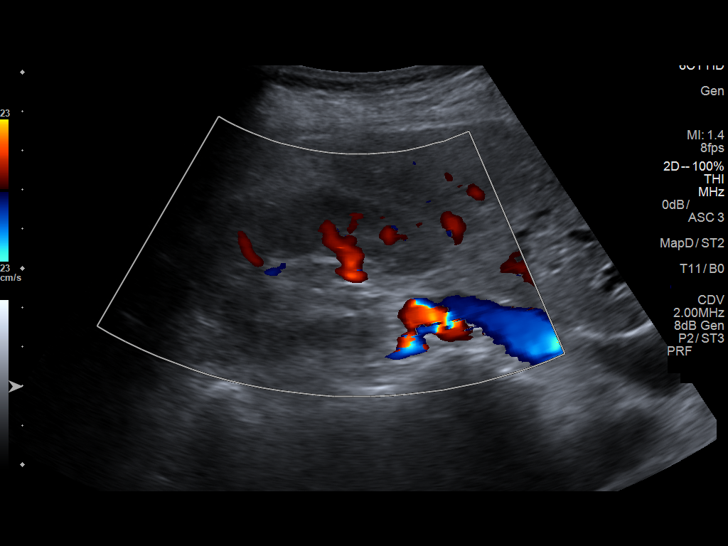
[im 116/127]
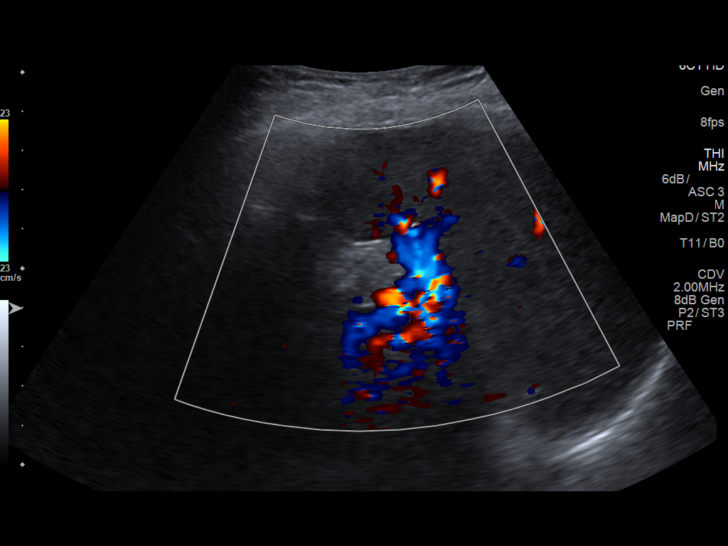
[im 127/127]
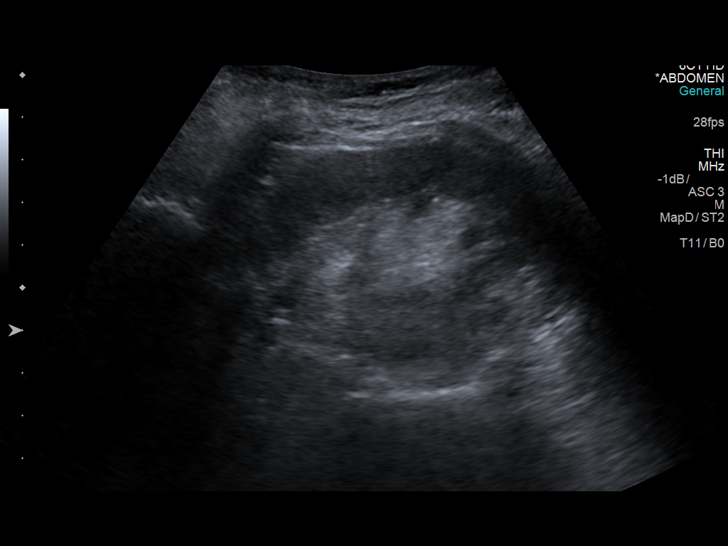

[13 of 25 positions shown; findings below may reference images not displayed]

FINDINGS: Gallbladder: Gallbladder is well distended with multiple echogenic
foci without posterior acoustical shadowing consistent with small
gallbladder polyps. No gallbladder wall thickening or
pericholecystic fluid is noted.

Common bile duct: Diameter: 2.6 mm.

Liver: The liver demonstrates some mild heterogeneity particularly
within the posterior aspect of the right lobe of the liver. There is
a vague area of diffuse increased echogenicity likely representing
focal fatty infiltration. No definitive mass is seen. Portal vein is
patent on color Doppler imaging with normal direction of blood flow
towards the liver.

IVC: No abnormality visualized.

Pancreas: Visualized portion unremarkable.

Spleen: Size and appearance within normal limits.

Right Kidney: Length: 9.5 cm. Echogenicity within normal limits. No
mass or hydronephrosis visualized.

Left Kidney: Length: 9.4 cm. Echogenicity within normal limits. No
mass or hydronephrosis visualized.

Abdominal aorta: No aneurysm visualized.

Other findings: None.
IMPRESSION: Gallbladder polyps without complicating factors.

Mild heterogeneity of the liver with what appears to be focal fatty
infiltration of the posterior aspect of the right lobe of the liver.
No discrete mass is seen.

## 2020-02-03 ENCOUNTER — Encounter: Payer: Self-pay | Admitting: Podiatry

## 2020-02-03 ENCOUNTER — Ambulatory Visit (INDEPENDENT_AMBULATORY_CARE_PROVIDER_SITE_OTHER): Payer: Medicare Other | Admitting: Podiatry

## 2020-02-03 ENCOUNTER — Other Ambulatory Visit: Payer: Self-pay

## 2020-02-03 VITALS — Temp 96.4°F

## 2020-02-03 DIAGNOSIS — B351 Tinea unguium: Secondary | ICD-10-CM | POA: Diagnosis not present

## 2020-02-03 DIAGNOSIS — M79672 Pain in left foot: Secondary | ICD-10-CM

## 2020-02-03 DIAGNOSIS — M79675 Pain in left toe(s): Secondary | ICD-10-CM

## 2020-02-03 DIAGNOSIS — M79674 Pain in right toe(s): Secondary | ICD-10-CM

## 2020-02-03 DIAGNOSIS — L84 Corns and callosities: Secondary | ICD-10-CM | POA: Diagnosis not present

## 2020-02-03 NOTE — Patient Instructions (Signed)

## 2020-02-08 NOTE — Progress Notes (Signed)
Subjective: [name] [name] presents today for follow up of callus(es) left foot and painful mycotic toenails b/l that are difficult to trim. Pain interferes with ambulation. Aggravating factors include wearing enclosed shoe gear. Pain is relieved with periodic professional debridement.  No Known Allergies   Objective: Vitals:   02/03/20 1358  Temp: (!) 96.4 F (35.8 C)    Pt 86 y.o. year old Caucasian female WD, WN in NAD. AAO x 3.   Vascular Examination:  Capillary refill time to digits immediate b/l. Palpable DP pulses b/l. Palpable PT pulses b/l. Pedal hair sparse b/l. Skin temperature gradient within normal limits b/l.  Dermatological Examination: Pedal skin with normal turgor, texture and tone bilaterally. No open wounds bilaterally. No interdigital macerations bilaterally. Toenails 1-5 b/l elongated, dystrophic, thickened, crumbly with subungual debris and tenderness to dorsal palpation. Hyperkeratotic lesion(s) submet head 1 left foot.  No erythema, no edema, no drainage, no flocculence.  Musculoskeletal: Normal muscle strength 5/5 to all lower extremity muscle groups bilaterally, no gross bony deformities bilaterally and no pain crepitus or joint limitation noted with ROM b/l  Neurological: Protective sensation intact 5/5 intact bilaterally with 10g monofilament b/l Vibratory sensation intact b/l.  Assessment: 1. Pain due to onychomycosis of toenails of both feet   2. Callus   3. Left foot pain    Plan: -Toenails 1-5 b/l were debrided in length and girth with sterile nail nippers and dremel without iatrogenic bleeding.  -Callus(es) submet head 1 left foot were debrided without complication or incident. Total number debrided =1. -Patient to continue soft, supportive shoe gear daily. -Patient to report any pedal injuries to medical professional immediately. -Patient/POA to call should there be question/concern in the interim.  Return in about 9 weeks (around 04/06/2020).

## 2020-02-09 ENCOUNTER — Ambulatory Visit: Payer: Medicare Other | Admitting: Podiatry

## 2020-02-15 ENCOUNTER — Other Ambulatory Visit: Payer: Self-pay | Admitting: Family Medicine

## 2020-02-15 DIAGNOSIS — I10 Essential (primary) hypertension: Secondary | ICD-10-CM

## 2020-03-01 DIAGNOSIS — H524 Presbyopia: Secondary | ICD-10-CM | POA: Diagnosis not present

## 2020-03-25 ENCOUNTER — Ambulatory Visit: Payer: Medicare Other | Admitting: Podiatry

## 2020-04-25 ENCOUNTER — Other Ambulatory Visit: Payer: Self-pay

## 2020-04-25 ENCOUNTER — Ambulatory Visit (INDEPENDENT_AMBULATORY_CARE_PROVIDER_SITE_OTHER): Payer: Medicare Other | Admitting: Podiatry

## 2020-04-25 ENCOUNTER — Encounter: Payer: Self-pay | Admitting: Podiatry

## 2020-04-25 DIAGNOSIS — M79672 Pain in left foot: Secondary | ICD-10-CM | POA: Diagnosis not present

## 2020-04-25 DIAGNOSIS — L84 Corns and callosities: Secondary | ICD-10-CM | POA: Diagnosis not present

## 2020-04-25 DIAGNOSIS — B351 Tinea unguium: Secondary | ICD-10-CM

## 2020-04-25 DIAGNOSIS — M79671 Pain in right foot: Secondary | ICD-10-CM

## 2020-04-25 DIAGNOSIS — M79675 Pain in left toe(s): Secondary | ICD-10-CM | POA: Diagnosis not present

## 2020-04-25 DIAGNOSIS — M79674 Pain in right toe(s): Secondary | ICD-10-CM | POA: Diagnosis not present

## 2020-04-25 DIAGNOSIS — L600 Ingrowing nail: Secondary | ICD-10-CM

## 2020-04-25 NOTE — Patient Instructions (Signed)
Apply antibiotic ointment to the great toenails once daily for a week

## 2020-04-29 NOTE — Progress Notes (Signed)
Subjective: [name] [name] is a pleasant 86 y.o. female patient seen today painful callus(es) right foot and painful mycotic toenails b/l that are difficult to trim. Pain interferes with ambulation. Aggravating factors include wearing enclosed shoe gear. Pain is relieved with periodic professional debridement.  Past Medical History:  Diagnosis Date  . Arthritis    "right thumb" (09/05/2015)  . Hyperlipidemia   . Hypertension   . Hypothyroidism   . Osteoporosis     Patient Active Problem List   Diagnosis Date Noted  . Weight decrease 08/04/2018  . Anxiety 08/04/2018  . Slow transit constipation 08/04/2018  . Chest pain 09/05/2015  . Hyperlipidemia 09/05/2015  . CKD (chronic kidney disease), stage II-III 09/05/2015  . Abnormal EKG 09/05/2015  . HEMATURIA UNSPECIFIED 11/01/2009  . BACK PAIN 11/01/2009  . Hypothyroidism 07/11/2008  . CERUMEN IMPACTION, BILATERAL 07/01/2008  . DIZZINESS 07/01/2008  . Essential hypertension 01/15/2008  . KNEE PAIN, RIGHT 01/15/2008  . Osteoporosis 01/15/2008  . TONSILLECTOMY AND ADENOIDECTOMY, HX OF 01/15/2008    Current Outpatient Medications on File Prior to Visit  Medication Sig Dispense Refill  . Calcium Carbonate (CALTRATE 600) 1500 MG TABS Take 1 tablet by mouth daily.     Marland Kitchen levothyroxine (SYNTHROID) 100 MCG tablet TAKE ONE TABLET (100MCG TOTAL) BY MOUTH DAILY 30 tablet 0  . lisinopril (ZESTRIL) 20 MG tablet TAKE ONE TABLET (20 MG TOTAL) BY MOUTH DAILY 90 tablet 1  . Multiple Vitamins-Minerals (ICAPS AREDS 2 PO) Take 1 tablet by mouth daily.    . nitroGLYCERIN (NITROSTAT) 0.4 MG SL tablet Place 1 tablet (0.4 mg total) under the tongue every 5 (five) minutes x 3 doses as needed for chest pain. 25 tablet 3  . sertraline (ZOLOFT) 50 MG tablet TAKE 1 TABLET BY MOUTH EVERY DAY 90 tablet 1  . sodium-potassium bicarbonate (ALKA-SELTZER GOLD) TBEF dissolvable tablet Take 1 tablet by mouth daily as needed (indigestion).     No current  facility-administered medications on file prior to visit.    No Known Allergies  Objective: Physical Exam  General: JULEA HUTTO is a pleasant 86 y.o. Caucasian female, in NAD. AAO x 3.   Vascular:  Capillary refill time to digits immediate b/l. Palpable pedal pulses b/l LE. Pedal hair sparse. Lower extremity skin temperature gradient within normal limits. No pain with calf compression b/l. No edema noted b/l.  Dermatological:  Pedal skin with normal turgor, texture and tone bilaterally. No open wounds bilaterally. No interdigital macerations bilaterally. Toenails 1-5 b/l elongated, discolored, dystrophic, thickened, crumbly with subungual debris and tenderness to dorsal palpation. Incurvated nailplate lateral border(s) L hallux. No nail border hypertrophy. No erythema, no edema, no drainage. Hyperkeratotic lesion(s) R hallux and submet head 1 left foot.  No erythema, no edema, no drainage, no flocculence.  Musculoskeletal:  Normal muscle strength 5/5 to all lower extremity muscle groups bilaterally. No pain crepitus or joint limitation noted with ROM b/l. No gross bony deformities bilaterally.  Neurological:  Protective sensation intact 5/5 intact bilaterally with 10g monofilament b/l. Vibratory sensation intact b/l. Proprioception intact bilaterally.  Assessment and Plan:  1. Pain due to onychomycosis of toenails of both feet   2. Callus   3. Pain in both feet   4. Ingrown toenail without infection    -Examined patient. -Toenails 1-5 b/l were debrided in length and girth with sterile nail nippers and dremel without iatrogenic bleeding.  -Offending nail border debrided and curretaged L hallux utilizing sterile nail nipper and currette. Border(s) cleansed with  alcohol and triple antibiotic ointment applied. Patient instructed to apply triple antibiotic ointment to L hallux once daily for 7 days. -Callus(es) R hallux and submet head 1 left foot pared utilizing sterile scalpel blade  without complication or incident. Total number debrided =2. -Patient to report any pedal injuries to medical professional immediately. -Patient to continue soft, supportive shoe gear daily. -Patient/POA to call should there be question/concern in the interim.  Return in about 9 weeks (around 06/27/2020) for nail and callus trim.  [name], DPM

## 2020-05-17 ENCOUNTER — Encounter: Payer: Self-pay | Admitting: Family Medicine

## 2020-05-24 ENCOUNTER — Telehealth: Payer: Self-pay | Admitting: Family Medicine

## 2020-05-24 NOTE — Telephone Encounter (Signed)
Pt has not been seen since 01/09/2019. Would you prefer pt have a OV with shingles shot? Please advise

## 2020-05-24 NOTE — Telephone Encounter (Signed)
Patient daughter called regarding if her mother can schedule single shot , please advise

## 2020-05-24 NOTE — Telephone Encounter (Signed)
It would be good for her to have ov but medicare will not pay for shingrix in the office---- part D medicare pay for it so it has to be done at the pharmacy

## 2020-05-24 NOTE — Telephone Encounter (Signed)
Call pt's daughter, Clydie Braun, back. Left a VM with a below instructions.

## 2020-05-27 ENCOUNTER — Ambulatory Visit: Payer: Medicare Other | Admitting: Family Medicine

## 2020-06-02 ENCOUNTER — Ambulatory Visit (HOSPITAL_BASED_OUTPATIENT_CLINIC_OR_DEPARTMENT_OTHER)
Admission: RE | Admit: 2020-06-02 | Discharge: 2020-06-02 | Disposition: A | Discharge status: Home / Self Care | Payer: Medicare Other | Source: Ambulatory Visit | Attending: Family Medicine | Admitting: Family Medicine

## 2020-06-02 ENCOUNTER — Encounter: Payer: Self-pay | Admitting: Family Medicine

## 2020-06-02 ENCOUNTER — Other Ambulatory Visit: Payer: Self-pay

## 2020-06-02 ENCOUNTER — Ambulatory Visit: Payer: Medicare Other | Admitting: Family Medicine

## 2020-06-02 VITALS — BP 124/70 | HR 72 | Temp 97.7°F | Resp 18 | Ht 63.0 in | Wt 120.4 lb

## 2020-06-02 DIAGNOSIS — M419 Scoliosis, unspecified: Secondary | ICD-10-CM | POA: Diagnosis not present

## 2020-06-02 DIAGNOSIS — M545 Low back pain, unspecified: Secondary | ICD-10-CM

## 2020-06-02 DIAGNOSIS — M549 Dorsalgia, unspecified: Secondary | ICD-10-CM

## 2020-06-02 DIAGNOSIS — M546 Pain in thoracic spine: Secondary | ICD-10-CM | POA: Diagnosis not present

## 2020-06-02 DIAGNOSIS — I10 Essential (primary) hypertension: Secondary | ICD-10-CM

## 2020-06-02 DIAGNOSIS — G8929 Other chronic pain: Secondary | ICD-10-CM | POA: Insufficient documentation

## 2020-06-02 DIAGNOSIS — E2839 Other primary ovarian failure: Secondary | ICD-10-CM

## 2020-06-02 DIAGNOSIS — J449 Chronic obstructive pulmonary disease, unspecified: Secondary | ICD-10-CM | POA: Diagnosis not present

## 2020-06-02 MED ORDER — LISINOPRIL 20 MG PO TABS: ORAL_TABLET | ORAL | 1 refills | Status: DC

## 2020-06-02 NOTE — Progress Notes (Signed)
Patient ID: [name] [name], female    DOB: June 06, 1927  Age: 86 y.o. MRN: [id number]    Subjective:  Subjective  HPI [name] [name] presents for f/u and c/o mid back pain after falling back onto a cabinet.  No loc    Her daughter is with her  Review of Systems  Constitutional: Negative for appetite change, diaphoresis, fatigue and unexpected weight change.  Eyes: Negative for pain, redness and visual disturbance.  Respiratory: Negative for cough, chest tightness, shortness of breath and wheezing.   Cardiovascular: Negative for chest pain, palpitations and leg swelling.  Endocrine: Negative for cold intolerance, heat intolerance, polydipsia, polyphagia and polyuria.  Genitourinary: Negative for difficulty urinating, dysuria and frequency.  Neurological: Negative for dizziness, light-headedness, numbness and headaches.    History Past Medical History:  Diagnosis Date  . Arthritis    "right thumb" (09/05/2015)  . Hyperlipidemia   . Hypertension   . Hypothyroidism   . Osteoporosis     She has a past surgical history that includes Tonsillectomy; Ovarian cyst surgery (1949); Cataract extraction w/ intraocular lens  implant, bilateral (Bilateral, 2010); and Closed reduction shoulder dislocation (Right).   Her family history includes Breast cancer in her daughter; Cancer in her father and mother; Heart disease in her brother; Ovarian cancer in her sister.She reports that she has never smoked. She has never used smokeless tobacco. She reports that she does not drink alcohol and does not use drugs.  Current Outpatient Medications on File Prior to Visit  Medication Sig Dispense Refill  . Calcium Carbonate (CALTRATE 600) 1500 MG TABS Take 1 tablet by mouth daily.     Marland Kitchen levothyroxine (SYNTHROID) 100 MCG tablet TAKE ONE TABLET ( TOTAL) BY MOUTH DAILY 30 tablet 0  . Multiple Vitamins-Minerals (ICAPS AREDS 2 PO) Take 1 tablet by mouth daily.    . nitroGLYCERIN (NITROSTAT) 0.4 MG SL tablet  Place 1 tablet (0.4 mg total) under the tongue every 5 (five) minutes x 3 doses as needed for chest pain. 25 tablet 3  . sodium-potassium bicarbonate (ALKA-SELTZER GOLD) TBEF dissolvable tablet Take 1 tablet by mouth daily as needed (indigestion).    . sertraline (ZOLOFT) 50 MG tablet TAKE 1 TABLET BY MOUTH EVERY DAY (Patient not taking: Reported on 06/02/2020) 90 tablet 1   No current facility-administered medications on file prior to visit.     Objective:  Objective  Physical Exam Vitals and nursing note reviewed.  Constitutional:      Appearance: She is well-developed.  HENT:     Head: Normocephalic and atraumatic.  Eyes:     Conjunctiva/sclera: Conjunctivae normal.  Neck:     Thyroid: No thyromegaly.     Vascular: No carotid bruit or JVD.  Cardiovascular:     Rate and Rhythm: Normal rate and regular rhythm.     Heart sounds: Normal heart sounds. No murmur heard.   Pulmonary:     Effort: Pulmonary effort is normal. No respiratory distress.     Breath sounds: Normal breath sounds. No wheezing or rales.  Chest:     Chest wall: No tenderness.  Musculoskeletal:        General: Tenderness present.     Cervical back: Normal range of motion and neck supple.     Right lower leg: No edema.     Left lower leg: No edema.  Neurological:     General: No focal deficit present.     Mental Status: She is alert and oriented to person, place, and  time.     Gait: Gait normal.     Deep Tendon Reflexes: Reflexes normal.    BP 124/70 (BP Location: Left Arm, Patient Position: Sitting, Cuff Size: Normal)   Pulse 72   Temp 97.7 F (36.5 C) (Oral)   Resp 18   Ht 5\' 3"  (1.6 m)   Wt 120 lb 6.4 oz (54.6 kg)   SpO2 98%   BMI 21.33 kg/m  Wt Readings from Last 3 Encounters:  06/02/20 120 lb 6.4 oz (54.6 kg)  01/09/19 126 lb (57.2 kg)  08/04/18 120 lb 12.8 oz (54.8 kg)     Lab Results  Component Value Date   WBC 7.7 01/09/2019   HGB 15.3 (H) 01/09/2019   HCT 45.5 01/09/2019   PLT  168.0 01/09/2019   GLUCOSE 86 01/09/2019   CHOL 221 (H) 01/09/2019   TRIG 78.0 01/09/2019   HDL 86.30 01/09/2019   LDLDIRECT 157.4 01/29/2011   LDLCALC 119 (H) 01/09/2019   ALT 28 01/09/2019   AST 26 01/09/2019   NA 142 01/09/2019   K 4.6 01/09/2019   CL 104 01/09/2019   CREATININE 0.82 01/09/2019   BUN 21 01/09/2019   CO2 29 01/09/2019   TSH 4.08 05/07/2019   INR 0.96 09/05/2015    09/07/2015 Abdomen Complete  Result Date: 02/21/2018 CLINICAL DATA:  Elevated LFTs EXAM: ABDOMEN ULTRASOUND COMPLETE COMPARISON:  None. FINDINGS: Gallbladder: Gallbladder is well distended with multiple echogenic foci without posterior acoustical shadowing consistent with small gallbladder polyps. No gallbladder wall thickening or pericholecystic fluid is noted. Common bile duct: Diameter: 2.6 mm. Liver: The liver demonstrates some mild heterogeneity particularly within the posterior aspect of the right lobe of the liver. There is a vague area of diffuse increased echogenicity likely representing focal fatty infiltration. No definitive mass is seen. Portal vein is patent on color Doppler imaging with normal direction of blood flow towards the liver. IVC: No abnormality visualized. Pancreas: Visualized portion unremarkable. Spleen: Size and appearance within normal limits. Right Kidney: Length: 9.5 cm. Echogenicity within normal limits. No mass or hydronephrosis visualized. Left Kidney: Length: 9.4 cm. Echogenicity within normal limits. No mass or hydronephrosis visualized. Abdominal aorta: No aneurysm visualized. Other findings: None. IMPRESSION: Gallbladder polyps without complicating factors. Mild heterogeneity of the liver with what appears to be focal fatty infiltration of the posterior aspect of the right lobe of the liver. No discrete mass is seen. Electronically Signed   By: 04/23/2018 M.D.   On: 02/21/2018 13:06     Assessment & Plan:  Plan  I am having 04/23/2018. Drollinger maintain her calcium carbonate,  sodium-potassium bicarbonate, Multiple Vitamins-Minerals (ICAPS AREDS 2 PO), nitroGLYCERIN, sertraline, levothyroxine, and lisinopril.  Meds ordered this encounter  Medications  . lisinopril (ZESTRIL) 20 MG tablet    Sig: TAKE ONE TABLET (20 MG TOTAL) BY MOUTH DAILY    Dispense:  90 tablet    Refill:  1    Problem List Items Addressed This Visit      Unprioritized   Essential hypertension   Relevant Medications   lisinopril (ZESTRIL) 20 MG tablet    Other Visit Diagnoses    Chronic right-sided low back pain without sciatica    -  Primary   Relevant Orders   DG Lumbar Spine Complete   Acute mid back pain       Relevant Orders   DG Ribs Unilateral W/Chest Right   DG Thoracic Spine 2 View   Estrogen deficiency  Relevant Orders   DG Bone Density      Follow-up: Return in about 3 months (around 09/02/2020), or if symptoms worsen or fail to improve, for annual exam, fasting.  Donato Schultz, DO

## 2020-06-03 DIAGNOSIS — G8929 Other chronic pain: Secondary | ICD-10-CM | POA: Insufficient documentation

## 2020-06-03 DIAGNOSIS — M549 Dorsalgia, unspecified: Secondary | ICD-10-CM | POA: Insufficient documentation

## 2020-06-03 NOTE — Assessment & Plan Note (Signed)
Well controlled, no changes to meds. Encouraged heart healthy diet such as the DASH diet and exercise as tolerated.  °

## 2020-06-03 NOTE — Assessment & Plan Note (Signed)
New from fall Check xray and bmd

## 2020-06-03 NOTE — Assessment & Plan Note (Signed)
Check xray 

## 2020-06-06 ENCOUNTER — Telehealth: Payer: Self-pay | Admitting: Family Medicine

## 2020-06-06 ENCOUNTER — Ambulatory Visit (HOSPITAL_BASED_OUTPATIENT_CLINIC_OR_DEPARTMENT_OTHER)
Admission: RE | Admit: 2020-06-06 | Discharge: 2020-06-06 | Disposition: A | Discharge status: Home / Self Care | Payer: Medicare Other | Source: Ambulatory Visit | Attending: Family Medicine | Admitting: Family Medicine

## 2020-06-06 ENCOUNTER — Other Ambulatory Visit: Payer: Self-pay

## 2020-06-06 DIAGNOSIS — Z780 Asymptomatic menopausal state: Secondary | ICD-10-CM | POA: Diagnosis not present

## 2020-06-06 DIAGNOSIS — E2839 Other primary ovarian failure: Secondary | ICD-10-CM | POA: Insufficient documentation

## 2020-06-06 DIAGNOSIS — R29890 Loss of height: Secondary | ICD-10-CM | POA: Diagnosis not present

## 2020-06-06 NOTE — Telephone Encounter (Signed)
We can try mobic 7.5 mg 1 po qd #30  2 refills if she agrees and if no relief  Refer to ortho-- Dr Ethelene Hal

## 2020-06-06 NOTE — Telephone Encounter (Signed)
Caller: Talbert Forest [phone]   Wants to know xray results

## 2020-06-06 NOTE — Telephone Encounter (Signed)
Spoke with daughter and she would like to know how do they proceed from here? Please advise

## 2020-06-06 NOTE — Telephone Encounter (Signed)
Spoke with the patient's daughter and she stated that she would speak with her mother to determine what she wanted to do in regards to starting a new medication.

## 2020-06-07 ENCOUNTER — Telehealth: Payer: Self-pay | Admitting: Family Medicine

## 2020-06-07 NOTE — Telephone Encounter (Signed)
Daughter is calling you back

## 2020-06-07 NOTE — Telephone Encounter (Signed)
Called daughter back-please refer to lab note.

## 2020-06-07 NOTE — Telephone Encounter (Signed)
I haven't called her 

## 2020-06-14 ENCOUNTER — Other Ambulatory Visit: Payer: Self-pay | Admitting: Family Medicine

## 2020-06-14 DIAGNOSIS — I10 Essential (primary) hypertension: Secondary | ICD-10-CM

## 2020-06-17 DIAGNOSIS — Z961 Presence of intraocular lens: Secondary | ICD-10-CM | POA: Diagnosis not present

## 2020-06-17 DIAGNOSIS — H524 Presbyopia: Secondary | ICD-10-CM | POA: Diagnosis not present

## 2020-06-17 DIAGNOSIS — H353132 Nonexudative age-related macular degeneration, bilateral, intermediate dry stage: Secondary | ICD-10-CM | POA: Diagnosis not present

## 2020-06-17 DIAGNOSIS — H04123 Dry eye syndrome of bilateral lacrimal glands: Secondary | ICD-10-CM | POA: Diagnosis not present

## 2020-06-17 DIAGNOSIS — H35033 Hypertensive retinopathy, bilateral: Secondary | ICD-10-CM | POA: Diagnosis not present

## 2020-07-29 ENCOUNTER — Other Ambulatory Visit: Payer: Self-pay

## 2020-07-29 ENCOUNTER — Ambulatory Visit (INDEPENDENT_AMBULATORY_CARE_PROVIDER_SITE_OTHER): Payer: Medicare Other | Admitting: Podiatry

## 2020-07-29 ENCOUNTER — Encounter: Payer: Self-pay | Admitting: Podiatry

## 2020-07-29 DIAGNOSIS — B351 Tinea unguium: Secondary | ICD-10-CM

## 2020-07-29 DIAGNOSIS — Z974 Presence of external hearing-aid: Secondary | ICD-10-CM | POA: Diagnosis not present

## 2020-07-29 DIAGNOSIS — M79675 Pain in left toe(s): Secondary | ICD-10-CM

## 2020-07-29 DIAGNOSIS — H9193 Unspecified hearing loss, bilateral: Secondary | ICD-10-CM | POA: Diagnosis not present

## 2020-07-29 DIAGNOSIS — L84 Corns and callosities: Secondary | ICD-10-CM

## 2020-07-29 DIAGNOSIS — M79672 Pain in left foot: Secondary | ICD-10-CM

## 2020-07-29 DIAGNOSIS — M79674 Pain in right toe(s): Secondary | ICD-10-CM | POA: Diagnosis not present

## 2020-07-29 DIAGNOSIS — H6123 Impacted cerumen, bilateral: Secondary | ICD-10-CM | POA: Diagnosis not present

## 2020-07-29 DIAGNOSIS — M79671 Pain in right foot: Secondary | ICD-10-CM

## 2020-07-31 NOTE — Progress Notes (Signed)
Subjective: [name] [name] is a pleasant 86 y.o. female patient seen today painful callus(es) right foot and painful mycotic toenails b/l that are difficult to trim. Pain interferes with ambulation. Aggravating factors include wearing enclosed shoe gear. Pain is relieved with periodic professional debridement.  Past Medical History:  Diagnosis Date  . Arthritis    "right thumb" (09/05/2015)  . Hyperlipidemia   . Hypertension   . Hypothyroidism   . Osteoporosis     Patient Active Problem List   Diagnosis Date Noted  . Chronic right-sided low back pain without sciatica 06/03/2020  . Acute mid back pain 06/03/2020  . Weight decrease 08/04/2018  . Anxiety 08/04/2018  . Slow transit constipation 08/04/2018  . Chest pain 09/05/2015  . Hyperlipidemia 09/05/2015  . CKD (chronic kidney disease), stage II-III 09/05/2015  . Abnormal EKG 09/05/2015  . HEMATURIA UNSPECIFIED 11/01/2009  . BACK PAIN 11/01/2009  . Hypothyroidism 07/11/2008  . CERUMEN IMPACTION, BILATERAL 07/01/2008  . DIZZINESS 07/01/2008  . Essential hypertension 01/15/2008  . KNEE PAIN, RIGHT 01/15/2008  . Osteoporosis 01/15/2008  . TONSILLECTOMY AND ADENOIDECTOMY, HX OF 01/15/2008    Current Outpatient Medications on File Prior to Visit  Medication Sig Dispense Refill  . Calcium Carbonate (CALTRATE 600) 1500 MG TABS Take 1 tablet by mouth daily.     Marland Kitchen levothyroxine (SYNTHROID) 100 MCG tablet TAKE ONE TABLET (100 MCG TOTAL) BY MOUTHDAILY 90 tablet 1  . lisinopril (ZESTRIL) 20 MG tablet Take 1 tablet (20 mg total) by mouth daily. 90 tablet 1  . Multiple Vitamins-Minerals (ICAPS AREDS 2 PO) Take 1 tablet by mouth daily.    . nitroGLYCERIN (NITROSTAT) 0.4 MG SL tablet Place 1 tablet (0.4 mg total) under the tongue every 5 (five) minutes x 3 doses as needed for chest pain. 25 tablet 3  . sertraline (ZOLOFT) 50 MG tablet TAKE 1 TABLET BY MOUTH EVERY DAY (Patient not taking: Reported on 06/02/2020) 90 tablet 1  .  sodium-potassium bicarbonate (ALKA-SELTZER GOLD) TBEF dissolvable tablet Take 1 tablet by mouth daily as needed (indigestion).     No current facility-administered medications on file prior to visit.    No Known Allergies  Objective: Physical Exam  General: [name] [name] is a pleasant 86 y.o. Caucasian female, in NAD. AAO x 3.   Vascular:  Capillary refill time to digits immediate b/l. Palpable pedal pulses b/l LE. Pedal hair sparse. Lower extremity skin temperature gradient within normal limits. No pain with calf compression b/l. No edema noted b/l.  Dermatological:  Pedal skin with normal turgor, texture and tone bilaterally. No open wounds bilaterally. No interdigital macerations bilaterally. Toenails 1-5 b/l elongated, discolored, dystrophic, thickened, crumbly with subungual debris and tenderness to dorsal palpation. Incurvated nailplate lateral border(s) L hallux. No nail border hypertrophy. No erythema, no edema, no drainage. Hyperkeratotic lesion(s) R hallux and submet head 1 left foot.  No erythema, no edema, no drainage, no flocculence.  Musculoskeletal:  Normal muscle strength 5/5 to all lower extremity muscle groups bilaterally. No pain crepitus or joint limitation noted with ROM b/l. No gross bony deformities bilaterally.  Neurological:  Protective sensation intact 5/5 intact bilaterally with 10g monofilament b/l. Vibratory sensation intact b/l. Proprioception intact bilaterally.  Assessment and Plan:  1. Pain due to onychomycosis of toenails of both feet   2. Callus   3. Pain in both feet   4. Left foot pain    -Examined patient. -Toenails 1-5 b/l were debrided in length and girth with sterile nail nippers and  dremel without iatrogenic bleeding.  -Offending nail border debrided and curretaged L hallux utilizing sterile nail nipper and currette. Border(s) cleansed with alcohol and triple antibiotic ointment applied. Patient instructed to apply triple antibiotic ointment  to L hallux once daily for 7 days. -Callus(es) R hallux and submet head 1 left foot pared utilizing sterile scalpel blade without complication or incident. Total number debrided =2. -Patient to report any pedal injuries to medical professional immediately. -Patient to continue soft, supportive shoe gear daily. -Patient/POA to call should there be question/concern in the interim.  Return in about 9 weeks (around 09/30/2020).  [name] [name], DPM

## 2020-09-09 ENCOUNTER — Other Ambulatory Visit: Payer: Self-pay

## 2020-09-09 ENCOUNTER — Ambulatory Visit (INDEPENDENT_AMBULATORY_CARE_PROVIDER_SITE_OTHER): Payer: Medicare Other | Admitting: Family Medicine

## 2020-09-09 ENCOUNTER — Encounter: Payer: Self-pay | Admitting: Family Medicine

## 2020-09-09 VITALS — BP 139/64 | HR 71 | Temp 97.9°F | Resp 16 | Ht 63.0 in | Wt 115.2 lb

## 2020-09-09 DIAGNOSIS — T148XXA Other injury of unspecified body region, initial encounter: Secondary | ICD-10-CM | POA: Insufficient documentation

## 2020-09-09 DIAGNOSIS — E039 Hypothyroidism, unspecified: Secondary | ICD-10-CM

## 2020-09-09 DIAGNOSIS — E785 Hyperlipidemia, unspecified: Secondary | ICD-10-CM | POA: Diagnosis not present

## 2020-09-09 DIAGNOSIS — R634 Abnormal weight loss: Secondary | ICD-10-CM | POA: Diagnosis not present

## 2020-09-09 DIAGNOSIS — Z23 Encounter for immunization: Secondary | ICD-10-CM | POA: Diagnosis not present

## 2020-09-09 DIAGNOSIS — I10 Essential (primary) hypertension: Secondary | ICD-10-CM | POA: Diagnosis not present

## 2020-09-09 DIAGNOSIS — Z0000 Encounter for general adult medical examination without abnormal findings: Secondary | ICD-10-CM

## 2020-09-09 DIAGNOSIS — Z0001 Encounter for general adult medical examination with abnormal findings: Secondary | ICD-10-CM | POA: Diagnosis not present

## 2020-09-09 NOTE — Progress Notes (Signed)
Subjective:     [name] [name] is a 86 y.o. female and is here for a comprehensive physical exam. The patient reports problems - weight loss-- she stopped her boost --- she will start this again--- her daughter is with her .  Social History   Socioeconomic History  . Marital status: Married    Spouse name: Not on file  . Number of children: Not on file  . Years of education: Not on file  . Highest education level: Not on file  Occupational History  . Not on file  Tobacco Use  . Smoking status: Never Smoker  . Smokeless tobacco: Never Used  Substance and Sexual Activity  . Alcohol use: No  . Drug use: No  . Sexual activity: Yes    Partners: Male  Other Topics Concern  . Not on file  Social History Narrative  . Not on file   Social Determinants of Health   Financial Resource Strain:   . Difficulty of Paying Living Expenses: Not on file  Food Insecurity:   . Worried About Programme researcher, broadcasting/film/video in the Last Year: Not on file  . Ran Out of Food in the Last Year: Not on file  Transportation Needs:   . Lack of Transportation (Medical): Not on file  . Lack of Transportation (Non-Medical): Not on file  Physical Activity:   . Days of Exercise per Week: Not on file  . Minutes of Exercise per Session: Not on file  Stress:   . Feeling of Stress : Not on file  Social Connections:   . Frequency of Communication with Friends and Family: Not on file  . Frequency of Social Gatherings with Friends and Family: Not on file  . Attends Religious Services: Not on file  . Active Member of Clubs or Organizations: Not on file  . Attends Banker Meetings: Not on file  . Marital Status: Not on file  Intimate Partner Violence:   . Fear of Current or Ex-Partner: Not on file  . Emotionally Abused: Not on file  . Physically Abused: Not on file  . Sexually Abused: Not on file   Health Maintenance  Topic Date Due  . TETANUS/TDAP  Never done  . PNA vac Low Risk Adult (2 of 2 - PPSV23)  01/10/2020  . INFLUENZA VACCINE  Completed  . DEXA SCAN  Completed  . COVID-19 Vaccine  Completed    The following portions of the patient's history were reviewed and updated as appropriate:  She  has a past medical history of Arthritis, Hyperlipidemia, Hypertension, Hypothyroidism, and Osteoporosis. She does not have any pertinent problems on file. She  has a past surgical history that includes Tonsillectomy; Ovarian cyst surgery (1949); Cataract extraction w/ intraocular lens  implant, bilateral (Bilateral, 2010); and Closed reduction shoulder dislocation (Right). Her family history includes Breast cancer in her daughter; Cancer in her father and mother; Heart disease in her brother; Ovarian cancer in her sister. She  reports that she has never smoked. She has never used smokeless tobacco. She reports that she does not drink alcohol and does not use drugs. She has a current medication list which includes the following prescription(s): calcium carbonate, levothyroxine, lisinopril, multiple vitamins-minerals, nitroglycerin, and sodium-potassium bicarbonate. Current Outpatient Medications on File Prior to Visit  Medication Sig Dispense Refill  . Calcium Carbonate (CALTRATE 600) 1500 MG TABS Take 1 tablet by mouth daily.     Marland Kitchen levothyroxine (SYNTHROID) 100 MCG tablet TAKE ONE TABLET (100 MCG TOTAL)  BY MOUTHDAILY 90 tablet 1  . lisinopril (ZESTRIL) 20 MG tablet Take 1 tablet (20 mg total) by mouth daily. 90 tablet 1  . Multiple Vitamins-Minerals (ICAPS AREDS 2 PO) Take 1 tablet by mouth daily.    . nitroGLYCERIN (NITROSTAT) 0.4 MG SL tablet Place 1 tablet (0.4 mg total) under the tongue every 5 (five) minutes x 3 doses as needed for chest pain. 25 tablet 3  . sodium-potassium bicarbonate (ALKA-SELTZER GOLD) TBEF dissolvable tablet Take 1 tablet by mouth daily as needed (indigestion).     No current facility-administered medications on file prior to visit.   She has No Known Allergies..  Review  of Systems Review of Systems  Constitutional: Negative for activity change, appetite change and fatigue.  HENT: Negative for hearing loss, congestion, tinnitus and ear discharge.  dentist q81m Eyes: Negative for visual disturbance (see optho q1y -- vision corrected to 20/20 with glasses).  Respiratory: Negative for cough, chest tightness and shortness of breath.   Cardiovascular: Negative for chest pain, palpitations and leg swelling.  Gastrointestinal: Negative for abdominal pain, diarrhea, constipation and abdominal distention.  Genitourinary: Negative for urgency, frequency, decreased urine volume and difficulty urinating.  Musculoskeletal: Negative for back pain, arthralgias and gait problem.  Skin: Negative for color change, pallor and rash.  Neurological: Negative for dizziness, light-headedness, numbness and headaches.  Hematological: Negative for adenopathy. Does not bruise/bleed easily.  Psychiatric/Behavioral: Negative for suicidal ideas, confusion, sleep disturbance, self-injury, dysphoric mood, decreased concentration and agitation.       Objective:    BP 139/64   Pulse 71   Temp 97.9 F (36.6 C) (Oral)   Resp 16   Ht 5\' 3"  (1.6 m)   Wt 115 lb 3.2 oz (52.3 kg)   SpO2 90%   BMI 20.41 kg/m  General appearance: alert, cooperative, appears stated age and no distress Head: Normocephalic, without obvious abnormality, atraumatic Eyes: conjunctivae/corneas clear. PERRL, EOM's intact. Fundi benign. Ears: normal TM's and external ear canals both ears Neck: no adenopathy, no carotid bruit, no JVD, supple, symmetrical, trachea midline and thyroid not enlarged, symmetric, no tenderness/mass/nodules Back: symmetric, no curvature. ROM normal. No CVA tenderness. Lungs: clear to auscultation bilaterally Breasts: gyn Heart: regular rate and rhythm, S1, S2 normal, no murmur, click, rub or gallop Abdomen: soft, non-tender; bowel sounds normal; no masses,  no organomegaly Pelvic:  deferred--gyn Extremities: extremities normal, atraumatic, no cyanosis or edema Pulses: 2+ and symmetric Skin: abnormal mole back L shoulder  Lymph nodes: Cervical, supraclavicular, and axillary nodes normal. Neurologic: Alert and oriented X 3, normal strength and tone. Normal symmetric reflexes. Normal coordination and gait    Assessment:    Healthy female exam.       Plan:     ghm utd Check labs  See After Visit Summary for Counseling Recommendations    1. Need for influenza vaccination  - Flu Vaccine QUAD High Dose(Fluad)  2. Primary hypertension Well controlled, no changes to meds. Encouraged heart healthy diet such as the DASH diet and exercise as tolerated.   - Lipid panel - TSH - Comprehensive metabolic panel  3. Preventative health care See above   4. Hyperlipidemia, unspecified hyperlipidemia type Encouraged heart healthy diet, increase exercise, avoid trans fats, consider a krill oil cap daily - Lipid panel - TSH - Comprehensive metabolic panel  5. Hypothyroidism, unspecified type .check labs  con't synthroid - TSH  6. Bruising   - CBC with Differential/Platelet

## 2020-09-09 NOTE — Assessment & Plan Note (Signed)
Pt to restart boost or ensure  F/u 3-6 months

## 2020-09-09 NOTE — Patient Instructions (Signed)
Preventive Care 38 Years and Older, Female Preventive care refers to lifestyle choices and visits with your health care provider that can promote health and wellness. This includes:  A yearly physical exam. This is also called an annual well check.  Regular dental and eye exams.  Immunizations.  Screening for certain conditions.  Healthy lifestyle choices, such as diet and exercise. What can I expect for my preventive care visit? Physical exam Your health care provider will check:  Height and weight. These may be used to calculate body mass index (BMI), which is a measurement that tells if you are at a healthy weight.  Heart rate and blood pressure.  Your skin for abnormal spots. Counseling Your health care provider may ask you questions about:  Alcohol, tobacco, and drug use.  Emotional well-being.  Home and relationship well-being.  Sexual activity.  Eating habits.  History of falls.  Memory and ability to understand (cognition).  Work and work Statistician.  Pregnancy and menstrual history. What immunizations do I need?  Influenza (flu) vaccine  This is recommended every year. Tetanus, diphtheria, and pertussis (Tdap) vaccine  You may need a Td booster every 10 years. Varicella (chickenpox) vaccine  You may need this vaccine if you have not already been vaccinated. Zoster (shingles) vaccine  You may need this after age 33. Pneumococcal conjugate (PCV13) vaccine  One dose is recommended after age 33. Pneumococcal polysaccharide (PPSV23) vaccine  One dose is recommended after age 72. Measles, mumps, and rubella (MMR) vaccine  You may need at least one dose of MMR if you were born in 1957 or later. You may also need a second dose. Meningococcal conjugate (MenACWY) vaccine  You may need this if you have certain conditions. Hepatitis A vaccine  You may need this if you have certain conditions or if you travel or work in places where you may be exposed  to hepatitis A. Hepatitis B vaccine  You may need this if you have certain conditions or if you travel or work in places where you may be exposed to hepatitis B. Haemophilus influenzae type b (Hib) vaccine  You may need this if you have certain conditions. You may receive vaccines as individual doses or as more than one vaccine together in one shot (combination vaccines). Talk with your health care provider about the risks and benefits of combination vaccines. What tests do I need? Blood tests  Lipid and cholesterol levels. These may be checked every 5 years, or more frequently depending on your overall health.  Hepatitis C test.  Hepatitis B test. Screening  Lung cancer screening. You may have this screening every year starting at age 39 if you have a 30-pack-year history of smoking and currently smoke or have quit within the past 15 years.  Colorectal cancer screening. All adults should have this screening starting at age 36 and continuing until age 15. Your health care provider may recommend screening at age 23 if you are at increased risk. You will have tests every 1-10 years, depending on your results and the type of screening test.  Diabetes screening. This is done by checking your blood sugar (glucose) after you have not eaten for a while (fasting). You may have this done every 1-3 years.  Mammogram. This may be done every 1-2 years. Talk with your health care provider about how often you should have regular mammograms.  BRCA-related cancer screening. This may be done if you have a family history of breast, ovarian, tubal, or peritoneal cancers.  Other tests  Sexually transmitted disease (STD) testing.  Bone density scan. This is done to screen for osteoporosis. You may have this done starting at age 44. Follow these instructions at home: Eating and drinking  Eat a diet that includes fresh fruits and vegetables, whole grains, lean protein, and low-fat dairy products. Limit  your intake of foods with high amounts of sugar, saturated fats, and salt.  Take vitamin and mineral supplements as recommended by your health care provider.  Do not drink alcohol if your health care provider tells you not to drink.  If you drink alcohol: ? Limit how much you have to 0-1 drink a day. ? Be aware of how much alcohol is in your drink. In the [place] one drink equals one 12 oz bottle of beer (355 mL), one 5 oz glass of wine (148 mL), or one 1 oz glass of hard liquor (44 mL). Lifestyle  Take daily care of your teeth and gums.  Stay active. Exercise for at least 30 minutes on 5 or more days each week.  Do not use any products that contain nicotine or tobacco, such as cigarettes, e-cigarettes, and chewing tobacco. If you need help quitting, ask your health care provider.  If you are sexually active, practice safe sex. Use a condom or other form of protection in order to prevent STIs (sexually transmitted infections).  Talk with your health care provider about taking a low-dose aspirin or statin. What's next?  Go to your health care provider once a year for a well check visit.  Ask your health care provider how often you should have your eyes and teeth checked.  Stay up to date on all vaccines. This information is not intended to replace advice given to you by your health care provider. Make sure you discuss any questions you have with your health care provider. Document Revised: 10/23/2018 Document Reviewed: 10/23/2018 Elsevier Patient Education  2020 [employer].

## 2020-09-10 LAB — COMPREHENSIVE METABOLIC PANEL
AG Ratio: 1.5 (calc) (ref 1.0–2.5)
ALT: 40 U/L — ABNORMAL HIGH (ref 6–29)
AST: 33 U/L (ref 10–35)
Albumin: 4.1 g/dL (ref 3.6–5.1)
Alkaline phosphatase (APISO): 146 U/L (ref 37–153)
BUN: 22 mg/dL (ref 7–25)
CO2: 25 mmol/L (ref 20–32)
Calcium: 10.1 mg/dL (ref 8.6–10.4)
Chloride: 104 mmol/L (ref 98–110)
Creat: 0.82 mg/dL (ref 0.60–0.88)
Globulin: 2.7 g/dL (calc) (ref 1.9–3.7)
Glucose, Bld: 95 mg/dL (ref 65–99)
Potassium: 5 mmol/L (ref 3.5–5.3)
Sodium: 141 mmol/L (ref 135–146)
Total Bilirubin: 0.7 mg/dL (ref 0.2–1.2)
Total Protein: 6.8 g/dL (ref 6.1–8.1)

## 2020-09-10 LAB — CBC WITH DIFFERENTIAL/PLATELET
Absolute Monocytes: 614 cells/uL (ref 200–950)
Basophils Absolute: 52 cells/uL (ref 0–200)
Basophils Relative: 0.7 %
Eosinophils Absolute: 37 cells/uL (ref 15–500)
Eosinophils Relative: 0.5 %
HCT: 43.7 % (ref 35.0–45.0)
Hemoglobin: 14.6 g/dL (ref 11.7–15.5)
Lymphs Abs: 1406 cells/uL (ref 850–3900)
MCH: 30.9 pg (ref 27.0–33.0)
MCHC: 33.4 g/dL (ref 32.0–36.0)
MCV: 92.6 fL (ref 80.0–100.0)
MPV: 11.4 fL (ref 7.5–12.5)
Monocytes Relative: 8.3 %
Neutro Abs: 5291 cells/uL (ref 1500–7800)
Neutrophils Relative %: 71.5 %
Platelets: 145 10*3/uL (ref 140–400)
RBC: 4.72 10*6/uL (ref 3.80–5.10)
RDW: 12.1 % (ref 11.0–15.0)
Total Lymphocyte: 19 %
WBC: 7.4 10*3/uL (ref 3.8–10.8)

## 2020-09-10 LAB — LIPID PANEL
Cholesterol: 221 mg/dL — ABNORMAL HIGH (ref <200)
HDL: 86 mg/dL (ref >=50)
LDL Cholesterol (Calc): 118 mg/dL (calc) — ABNORMAL HIGH
Non-HDL Cholesterol (Calc): 135 mg/dL (calc) — ABNORMAL HIGH (ref <130)
Total CHOL/HDL Ratio: 2.6 (calc) (ref <5.0)
Triglycerides: 74 mg/dL (ref <150)

## 2020-09-10 LAB — TSH: TSH: 3.11 mIU/L (ref 0.40–4.50)

## 2020-09-13 ENCOUNTER — Encounter: Payer: Self-pay | Admitting: *Deleted

## 2020-09-23 DIAGNOSIS — L820 Inflamed seborrheic keratosis: Secondary | ICD-10-CM | POA: Diagnosis not present

## 2020-09-23 DIAGNOSIS — L814 Other melanin hyperpigmentation: Secondary | ICD-10-CM | POA: Diagnosis not present

## 2020-09-23 DIAGNOSIS — L821 Other seborrheic keratosis: Secondary | ICD-10-CM | POA: Diagnosis not present

## 2020-09-23 DIAGNOSIS — D1801 Hemangioma of skin and subcutaneous tissue: Secondary | ICD-10-CM | POA: Diagnosis not present

## 2020-09-30 ENCOUNTER — Ambulatory Visit: Payer: Medicare Other | Admitting: Podiatry

## 2020-09-30 ENCOUNTER — Other Ambulatory Visit (HOSPITAL_BASED_OUTPATIENT_CLINIC_OR_DEPARTMENT_OTHER): Payer: Self-pay | Admitting: Internal Medicine

## 2020-09-30 ENCOUNTER — Ambulatory Visit: Payer: Medicare Other | Attending: Internal Medicine

## 2020-09-30 ENCOUNTER — Encounter: Payer: Self-pay | Admitting: Podiatry

## 2020-09-30 ENCOUNTER — Other Ambulatory Visit: Payer: Self-pay

## 2020-09-30 DIAGNOSIS — M79671 Pain in right foot: Secondary | ICD-10-CM

## 2020-09-30 DIAGNOSIS — M79675 Pain in left toe(s): Secondary | ICD-10-CM | POA: Diagnosis not present

## 2020-09-30 DIAGNOSIS — M79674 Pain in right toe(s): Secondary | ICD-10-CM | POA: Diagnosis not present

## 2020-09-30 DIAGNOSIS — B351 Tinea unguium: Secondary | ICD-10-CM

## 2020-09-30 DIAGNOSIS — L84 Corns and callosities: Secondary | ICD-10-CM

## 2020-09-30 DIAGNOSIS — M79672 Pain in left foot: Secondary | ICD-10-CM | POA: Diagnosis not present

## 2020-09-30 DIAGNOSIS — Z23 Encounter for immunization: Secondary | ICD-10-CM

## 2020-10-02 NOTE — Progress Notes (Signed)
Subjective: [name] [name] is a pleasant 86 y.o. female patient seen today painful callus(es) right foot and painful mycotic toenails b/l that are difficult to trim. Pain interferes with ambulation. Aggravating factors include wearing enclosed shoe gear. Pain is relieved with periodic professional debridement.   Her daughter is present during today's visit.  Past Medical History:  Diagnosis Date  . Arthritis    "right thumb" (09/05/2015)  . Hyperlipidemia   . Hypertension   . Hypothyroidism   . Osteoporosis     Patient Active Problem List   Diagnosis Date Noted  . Bruising 09/09/2020  . Chronic right-sided low back pain without sciatica 06/03/2020  . Acute mid back pain 06/03/2020  . Weight decrease 08/04/2018  . Anxiety 08/04/2018  . Slow transit constipation 08/04/2018  . Chest pain 09/05/2015  . Hyperlipidemia 09/05/2015  . CKD (chronic kidney disease), stage II-III 09/05/2015  . Abnormal EKG 09/05/2015  . HEMATURIA UNSPECIFIED 11/01/2009  . BACK PAIN 11/01/2009  . Hypothyroidism 07/11/2008  . CERUMEN IMPACTION, BILATERAL 07/01/2008  . DIZZINESS 07/01/2008  . Essential hypertension 01/15/2008  . KNEE PAIN, RIGHT 01/15/2008  . Osteoporosis 01/15/2008  . TONSILLECTOMY AND ADENOIDECTOMY, HX OF 01/15/2008    Current Outpatient Medications on File Prior to Visit  Medication Sig Dispense Refill  . Calcium Carbonate (CALTRATE 600) 1500 MG TABS Take 1 tablet by mouth daily.     Marland Kitchen levothyroxine (SYNTHROID) 100 MCG tablet TAKE ONE TABLET (100 MCG TOTAL) BY MOUTHDAILY 90 tablet 1  . lisinopril (ZESTRIL) 20 MG tablet Take 1 tablet (20 mg total) by mouth daily. 90 tablet 1  . Multiple Vitamins-Minerals (ICAPS AREDS 2 PO) Take 1 tablet by mouth daily.    . nitroGLYCERIN (NITROSTAT) 0.4 MG SL tablet Place 1 tablet (0.4 mg total) under the tongue every 5 (five) minutes x 3 doses as needed for chest pain. 25 tablet 3  . sodium-potassium bicarbonate (ALKA-SELTZER GOLD) TBEF dissolvable  tablet Take 1 tablet by mouth daily as needed (indigestion).     No current facility-administered medications on file prior to visit.    No Known Allergies  Objective: Physical Exam  General: [name] [name] is a pleasant 86 y.o. Caucasian female, in NAD. AAO x 3.   Vascular:  Capillary refill time to digits immediate b/l. Palpable pedal pulses b/l LE. Pedal hair sparse. Lower extremity skin temperature gradient within normal limits. No pain with calf compression b/l. No edema noted b/l.  Dermatological:  Pedal skin with normal turgor, texture and tone bilaterally. No open wounds bilaterally. No interdigital macerations bilaterally. Toenails 1-5 b/l elongated, discolored, dystrophic, thickened, crumbly with subungual debris and tenderness to dorsal palpation. Incurvated nailplate lateral border(s) L hallux. No nail border hypertrophy. No erythema, no edema, no drainage. Hyperkeratotic lesion(s) R hallux and submet head 1 left foot.  No erythema, no edema, no drainage, no flocculence.  Musculoskeletal:  Normal muscle strength 5/5 to all lower extremity muscle groups bilaterally. No pain crepitus or joint limitation noted with ROM b/l. No gross bony deformities bilaterally.  Neurological:  Protective sensation intact 5/5 intact bilaterally with 10g monofilament b/l. Vibratory sensation intact b/l. Proprioception intact bilaterally.  Assessment and Plan:  1. Pain due to onychomycosis of toenails of both feet   2. Callus   3. Pain in both feet    -Examined patient. -Toenails 1-5 b/l were debrided in length and girth with sterile nail nippers and dremel without iatrogenic bleeding.  -Offending nail border debrided and curretaged L hallux utilizing sterile nail  nipper and currette. Border(s) cleansed with alcohol and triple antibiotic ointment applied. Patient instructed to apply triple antibiotic ointment to L hallux once daily for 7 days. -Callus(es) R hallux and submet head 1 left foot  pared utilizing sterile scalpel blade without complication or incident. Total number debrided =2. -Patient to report any pedal injuries to medical professional immediately. -Patient to continue soft, supportive shoe gear daily. -Patient/POA to call should there be question/concern in the interim.  Return in about 3 months (around 12/31/2020).  [name], DPM

## 2020-11-23 DIAGNOSIS — H353134 Nonexudative age-related macular degeneration, bilateral, advanced atrophic with subfoveal involvement: Secondary | ICD-10-CM | POA: Diagnosis not present

## 2020-11-23 DIAGNOSIS — H43811 Vitreous degeneration, right eye: Secondary | ICD-10-CM | POA: Diagnosis not present

## 2020-11-29 ENCOUNTER — Ambulatory Visit: Payer: Medicare Other | Admitting: Podiatry

## 2020-12-05 NOTE — Progress Notes (Signed)
Cardiology Office Note    Date:  12/08/2020   ID:  [name] [name], DOB 05-10-1927, MRN [id number]  PCP:  Donato Schultz, DO  Cardiologist:  Jamarri Vuncannon [place], MD    History of Present Illness:  [name] [name] is a 87 y.o. female seen for follow up of HTN. Last seen in September 2019.  She has a past medical history of hypertension, hypothyroidism, hyperlipidemia and a family history of heart disease but no personal history of cardiac issues. She never smoked. Her last echocardiogram in August 2016 showed moderate LVH, EF 60-65%, grade 1 diastolic dysfunction, high ventricular filling pressure, mild MR, mild left atrial enlargement, PAS be 36. She was admitted in October 2016 with chest pain and ruled out for MI. A Myoview study was normal. She was seen in February 2018 with very atypical chest pain that was not felt to be cardiac.   She is seen back today just to be checked per her daughter. She is 41 years old. She is still active and energetic most days but feels tired more frequently. No symptoms of chest pain or dyspnea. Appetite is not great and she has lost weight steadily over the past few years. Notes hoarseness progressive over 3 year.    Past Medical History:  Diagnosis Date  . Arthritis    "right thumb" (09/05/2015)  . Hyperlipidemia   . Hypertension   . Hypothyroidism   . Osteoporosis     Past Surgical History:  Procedure Laterality Date  . CATARACT EXTRACTION W/ INTRAOCULAR LENS  IMPLANT, BILATERAL Bilateral 2010  . CLOSED REDUCTION SHOULDER DISLOCATION Right   . OVARIAN CYST SURGERY  1949  . TONSILLECTOMY      Current Medications: Outpatient Medications Prior to Visit  Medication Sig Dispense Refill  . calcium carbonate (OSCAL) 1500 (600 Ca) MG TABS tablet Take 1 tablet by mouth daily.    Marland Kitchen levothyroxine (SYNTHROID) 100 MCG tablet TAKE ONE TABLET (100 MCG TOTAL) BY MOUTHDAILY 90 tablet 1  . lisinopril (ZESTRIL) 20 MG tablet Take 1 tablet (20 mg total) by mouth  daily. 90 tablet 1  . Multiple Vitamins-Minerals (ICAPS AREDS 2 PO) Take 1 tablet by mouth daily.    . nitroGLYCERIN (NITROSTAT) 0.4 MG SL tablet Place 1 tablet (0.4 mg total) under the tongue every 5 (five) minutes x 3 doses as needed for chest pain. 25 tablet 3  . sodium-potassium bicarbonate (ALKA-SELTZER GOLD) TBEF dissolvable tablet Take 1 tablet by mouth daily as needed (indigestion).     No facility-administered medications prior to visit.     Allergies:   Patient has no known allergies.   Social History   Socioeconomic History  . Marital status: Married    Spouse name: Not on file  . Number of children: Not on file  . Years of education: Not on file  . Highest education level: Not on file  Occupational History  . Not on file  Tobacco Use  . Smoking status: Never Smoker  . Smokeless tobacco: Never Used  Substance and Sexual Activity  . Alcohol use: No  . Drug use: No  . Sexual activity: Yes    Partners: Male  Other Topics Concern  . Not on file  Social History Narrative  . Not on file   Social Determinants of Health   Financial Resource Strain: Not on file  Food Insecurity: Not on file  Transportation Needs: Not on file  Physical Activity: Not on file  Stress: Not on file  Social Connections: Not on file     Family History:  The patient's family history includes Breast cancer in her daughter; Cancer in her father and mother; Heart disease in her brother; Ovarian cancer in her sister.   ROS:   Please see the history of present illness.    ROS All other systems reviewed and are negative.   PHYSICAL EXAM:   VS:  BP (!) 160/65 (BP Location: Left Arm, Patient Position: Sitting)   Pulse 73   Ht 5\' 3"  (1.6 m)   Wt 115 lb (52.2 kg)   SpO2 97%   BMI 20.37 kg/m    GEN: Well nourished, well developed, in no acute distress  HEENT: normal  Neck: no JVD, carotid bruits, or masses Cardiac: RRR; no murmurs, rubs, or gallops,no edema  Respiratory:  clear to  auscultation bilaterally, normal work of breathing GI: soft, nontender, nondistended, + BS MS: no deformity or atrophy  Skin: warm and dry, no rash Neuro:  Alert and Oriented x 3, Strength and sensation are intact Psych: euthymic mood, full affect  Wt Readings from Last 3 Encounters:  12/08/20 115 lb (52.2 kg)  09/09/20 115 lb 3.2 oz (52.3 kg)  06/02/20 120 lb 6.4 oz (54.6 kg)      Studies/Labs Reviewed:   EKG:  EKG is ordered today.  The ekg ordered today demonstrates NSR with incomplete RBBB. I have personally reviewed and interpreted this study.   Recent Labs: 09/09/2020: ALT 40; BUN 22; Creat 0.82; Hemoglobin 14.6; Platelets 145; Potassium 5.0; Sodium 141; TSH 3.11   Lipid Panel    Component Value Date/Time   CHOL 221 (H) 09/09/2020 1119   TRIG 74 09/09/2020 1119   HDL 86 09/09/2020 1119   CHOLHDL 2.6 09/09/2020 1119   VLDL 15.6 01/09/2019 1118   LDLCALC 118 (H) 09/09/2020 1119   LDLDIRECT 157.4 01/29/2011 0959    Additional studies/ records that were reviewed today include:  Echo 06/22/15: Study Conclusions  - Left ventricle: The cavity size was normal. Wall thickness was   increased in a pattern of moderate LVH. Systolic function was   normal. The estimated ejection fraction was in the range of 60%   to 65%. Wall motion was normal; there were no regional wall   motion abnormalities. Doppler parameters are consistent with   abnormal left ventricular relaxation (grade 1 diastolic   dysfunction). Doppler parameters are consistent with high   ventricular filling pressure. - Mitral valve: Moderately calcified annulus. Mildly thickened   leaflets . There was mild regurgitation. - Left atrium: The atrium was mildly dilated. - Pulmonary arteries: Systolic pressure was mildly increased. PA   peak pressure: 36 mm Hg (S).  Myoview 09/05/17: MYOCARDIAL IMAGING WITH SPECT (REST AND PHARMACOLOGIC-STRESS)  GATED LEFT VENTRICULAR WALL MOTION STUDY  LEFT VENTRICULAR  EJECTION FRACTION  TECHNIQUE: Standard myocardial SPECT imaging was performed after resting intravenous injection of 10 mCi Tc-57m sestamibi. Subsequently, intravenous infusion of Lexiscan was performed under the supervision of the Cardiology staff. At peak effect of the drug, 30 mCi Tc-73m sestamibi was injected intravenously and standard myocardial SPECT imaging was performed. Quantitative gated imaging was also performed to evaluate left ventricular wall motion, and estimate left ventricular ejection fraction.  COMPARISON:  Chest x-ray 09/05/2015  FINDINGS: Perfusion: No decreased activity in the left ventricle on stress imaging to suggest reversible ischemia or infarction.  Wall Motion: Normal left ventricular wall motion. No left ventricular dilation.  Left Ventricular Ejection Fraction: 84 %  End diastolic volume  37 ml  End systolic volume 6 ml  IMPRESSION: 1. No reversible ischemia or infarction.  2. Normal left ventricular wall motion.  3. Left ventricular ejection fraction 84% which is likely exaggerated and due in part to small left ventricular volume.  4. Low-risk stress test findings*.  *2012 Appropriate Use Criteria for Coronary Revascularization Focused Update: J Am Coll Cardiol. 2012;59(9):857-881. http://content.dementiazones.com.aspx?articleid=1201161   Electronically Signed   By: Malachy Moan M.D.   On: 09/06/2015 14:44  ASSESSMENT:    1. Primary hypertension      PLAN:  In order of problems listed above:  1. BP is elevated today but generally other readings  have been good. Requested she monitor BP at home and keep diary. At her age would allow pretty liberal BP control.  2. Weight loss. Encourage use of supplement like Boost.   Follow up PRN    Medication Adjustments/Labs and Tests Ordered: Current medicines are reviewed at length with the patient today.  Concerns regarding medicines are outlined above.   Medication changes, Labs and Tests ordered today are listed in the Patient Instructions below. There are no Patient Instructions on file for this visit.   Signed, Domingue Coltrain [place], MD  12/08/2020 11:55 AM    [facility] [address] [place], [place], [address] [phone]

## 2020-12-06 ENCOUNTER — Other Ambulatory Visit: Payer: Self-pay

## 2020-12-06 ENCOUNTER — Encounter: Payer: Self-pay | Admitting: Podiatry

## 2020-12-06 ENCOUNTER — Ambulatory Visit (INDEPENDENT_AMBULATORY_CARE_PROVIDER_SITE_OTHER): Payer: Medicare Other | Admitting: Podiatry

## 2020-12-06 DIAGNOSIS — B351 Tinea unguium: Secondary | ICD-10-CM | POA: Diagnosis not present

## 2020-12-06 DIAGNOSIS — M79674 Pain in right toe(s): Secondary | ICD-10-CM

## 2020-12-06 DIAGNOSIS — M79675 Pain in left toe(s): Secondary | ICD-10-CM

## 2020-12-06 DIAGNOSIS — M79671 Pain in right foot: Secondary | ICD-10-CM

## 2020-12-06 DIAGNOSIS — L84 Corns and callosities: Secondary | ICD-10-CM | POA: Diagnosis not present

## 2020-12-06 DIAGNOSIS — M79672 Pain in left foot: Secondary | ICD-10-CM

## 2020-12-08 ENCOUNTER — Ambulatory Visit: Payer: Medicare Other | Admitting: Cardiology

## 2020-12-08 ENCOUNTER — Other Ambulatory Visit: Payer: Self-pay

## 2020-12-08 ENCOUNTER — Encounter: Payer: Self-pay | Admitting: Cardiology

## 2020-12-08 VITALS — BP 160/65 | HR 73 | Ht 63.0 in | Wt 115.0 lb

## 2020-12-08 DIAGNOSIS — I10 Essential (primary) hypertension: Secondary | ICD-10-CM | POA: Diagnosis not present

## 2020-12-08 NOTE — Addendum Note (Signed)
Addended by: Neoma Laming on: 12/08/2020 01:03 PM   Modules accepted: Orders

## 2020-12-10 NOTE — Progress Notes (Signed)
Subjective: [name] [name] is a pleasant 87 y.o. female patient seen today painful callus(es) right foot and painful mycotic toenails b/l that are difficult to trim. Pain interferes with ambulation. Aggravating factors include wearing enclosed shoe gear. Pain is relieved with periodic professional debridement.   Her daughter is present during today's visit.  Her CP is Dr. Seabron Spates and last visit was 09/09/2020.  Past Medical History:  Diagnosis Date  . Arthritis    "right thumb" (09/05/2015)  . Hyperlipidemia   . Hypertension   . Hypothyroidism   . Osteoporosis     Patient Active Problem List   Diagnosis Date Noted  . Bruising 09/09/2020  . Chronic right-sided low back pain without sciatica 06/03/2020  . Acute mid back pain 06/03/2020  . Weight decrease 08/04/2018  . Anxiety 08/04/2018  . Slow transit constipation 08/04/2018  . Chest pain 09/05/2015  . Hyperlipidemia 09/05/2015  . CKD (chronic kidney disease), stage II-III 09/05/2015  . Abnormal EKG 09/05/2015  . HEMATURIA UNSPECIFIED 11/01/2009  . BACK PAIN 11/01/2009  . Hypothyroidism 07/11/2008  . CERUMEN IMPACTION, BILATERAL 07/01/2008  . DIZZINESS 07/01/2008  . Essential hypertension 01/15/2008  . KNEE PAIN, RIGHT 01/15/2008  . Osteoporosis 01/15/2008  . TONSILLECTOMY AND ADENOIDECTOMY, HX OF 01/15/2008    Current Outpatient Medications on File Prior to Visit  Medication Sig Dispense Refill  . calcium carbonate (OSCAL) 1500 (600 Ca) MG TABS tablet Take 1 tablet by mouth daily.    Marland Kitchen levothyroxine (SYNTHROID) 100 MCG tablet TAKE ONE TABLET (100 MCG TOTAL) BY MOUTHDAILY 90 tablet 1  . lisinopril (ZESTRIL) 20 MG tablet Take 1 tablet (20 mg total) by mouth daily. 90 tablet 1  . Multiple Vitamins-Minerals (ICAPS AREDS 2 PO) Take 1 tablet by mouth daily.    . nitroGLYCERIN (NITROSTAT) 0.4 MG SL tablet Place 1 tablet (0.4 mg total) under the tongue every 5 (five) minutes x 3 doses as needed for chest pain. 25  tablet 3  . sodium-potassium bicarbonate (ALKA-SELTZER GOLD) TBEF dissolvable tablet Take 1 tablet by mouth daily as needed (indigestion).     No current facility-administered medications on file prior to visit.    No Known Allergies  Objective: Physical Exam  General: MARDELLE [name] is a pleasant 87 y.o. Caucasian female, in NAD. AAO x 3.   Vascular:  Capillary refill time to digits immediate b/l. Palpable pedal pulses b/l LE. Pedal hair sparse. Lower extremity skin temperature gradient within normal limits. No pain with calf compression b/l. No edema noted b/l.  Dermatological:  Pedal skin with normal turgor, texture and tone bilaterally. No open wounds bilaterally. No interdigital macerations bilaterally. Toenails 1-5 b/l elongated, discolored, dystrophic, thickened, crumbly with subungual debris and tenderness to dorsal palpation. Incurvated nailplate lateral border(s) L hallux. No nail border hypertrophy. No erythema, no edema, no drainage. Hyperkeratotic lesion(s) R hallux and submet head 1 left foot.  No erythema, no edema, no drainage, no flocculence.  Musculoskeletal:  Normal muscle strength 5/5 to all lower extremity muscle groups bilaterally. No pain crepitus or joint limitation noted with ROM b/l. No gross bony deformities bilaterally.  Neurological:  Protective sensation intact 5/5 intact bilaterally with 10g monofilament b/l. Vibratory sensation intact b/l. Proprioception intact bilaterally.  Assessment and Plan:  1. Pain due to onychomycosis of toenails of both feet   2. Callus   3. Pain in both feet    -Examined patient. -Toenails 1-5 b/l were debrided in length and girth with sterile nail nippers and dremel without  iatrogenic bleeding.  -Offending nail border debrided and curretaged L hallux utilizing sterile nail nipper and currette. Border(s) cleansed with alcohol and triple antibiotic ointment applied. Patient instructed to apply triple antibiotic ointment to L  hallux once daily for 7 days. -Callus(es) R hallux and submet head 1 left foot pared utilizing sterile scalpel blade without complication or incident. Total number debrided =2. -Patient to report any pedal injuries to medical professional immediately. -Patient to continue soft, supportive shoe gear daily. -Patient/POA to call should there be question/concern in the interim.  Return in about 9 weeks (around 02/07/2021).  [name], DPM

## 2020-12-27 ENCOUNTER — Other Ambulatory Visit: Payer: Self-pay | Admitting: Family Medicine

## 2021-02-07 ENCOUNTER — Ambulatory Visit: Payer: Medicare Other | Admitting: Podiatry

## 2021-02-07 ENCOUNTER — Encounter: Payer: Self-pay | Admitting: Podiatry

## 2021-02-07 ENCOUNTER — Other Ambulatory Visit: Payer: Self-pay

## 2021-02-07 DIAGNOSIS — M79671 Pain in right foot: Secondary | ICD-10-CM

## 2021-02-07 DIAGNOSIS — B351 Tinea unguium: Secondary | ICD-10-CM | POA: Diagnosis not present

## 2021-02-07 DIAGNOSIS — L84 Corns and callosities: Secondary | ICD-10-CM | POA: Diagnosis not present

## 2021-02-07 DIAGNOSIS — M79672 Pain in left foot: Secondary | ICD-10-CM | POA: Diagnosis not present

## 2021-02-07 DIAGNOSIS — M79674 Pain in right toe(s): Secondary | ICD-10-CM

## 2021-02-07 DIAGNOSIS — M79675 Pain in left toe(s): Secondary | ICD-10-CM | POA: Diagnosis not present

## 2021-02-07 NOTE — Progress Notes (Signed)
Subjective: [name] [name] is a pleasant 87 y.o. female patient seen today painful callus(es) right foot and painful mycotic toenails b/l that are difficult to trim. Pain interferes with ambulation. Aggravating factors include wearing enclosed shoe gear. Pain is relieved with periodic professional debridement.   Her daughter is present during today's visit. They voice no new pedal concerns on today's visit.  Her PCP is Dr. Seabron Spates and last visit was 09/09/2020.  No Known Allergies  Objective: Physical Exam  General: [name] [name] is a pleasant 87 y.o. Caucasian female, in NAD. AAO x 3.   Vascular:  Capillary refill time to digits immediate b/l. Palpable pedal pulses b/l LE. Pedal hair sparse. Lower extremity skin temperature gradient within normal limits. No pain with calf compression b/l. No edema noted b/l.  Dermatological:  Pedal skin with normal turgor, texture and tone bilaterally. No open wounds bilaterally. No interdigital macerations bilaterally. Toenails 1-5 b/l elongated, discolored, dystrophic, thickened, crumbly with subungual debris and tenderness to dorsal palpation. Incurvated nailplate lateral border(s) L hallux. No nail border hypertrophy. No erythema, no edema, no drainage. Hyperkeratotic lesion(s) R hallux and submet head 1 left foot.  No erythema, no edema, no drainage, no flocculence.  Musculoskeletal:  Normal muscle strength 5/5 to all lower extremity muscle groups bilaterally. No pain crepitus or joint limitation noted with ROM b/l. No gross bony deformities bilaterally.  Neurological:  Protective sensation intact 5/5 intact bilaterally with 10g monofilament b/l. Vibratory sensation intact b/l. Proprioception intact bilaterally.  Assessment and Plan:  1. Pain due to onychomycosis of toenails of both feet   2. Callus   3. Pain in both feet     -Examined patient. -Patient to continue soft, supportive shoe gear daily. -Toenails 1-5 b/l were debrided in  length and girth with sterile nail nippers and dremel without iatrogenic bleeding.  -Callus(es) R hallux and submet head 1 left foot pared utilizing sterile scalpel blade without complication or incident. Total number debrided =2. -Patient to report any pedal injuries to medical professional immediately. -Recommended Skechers shoes with stretchable uppers and memory foam insoles which may be purchased at [employer], [employer], [employer], [employer], [employer] or at [email or web address]. -Patient/POA to call should there be question/concern in the interim.  Return in about 3 months (around 05/10/2021).  [name], DPM

## 2021-02-10 ENCOUNTER — Ambulatory Visit: Payer: Medicare Other | Admitting: Podiatry

## 2021-02-14 ENCOUNTER — Other Ambulatory Visit: Payer: Self-pay | Admitting: Family Medicine

## 2021-02-14 DIAGNOSIS — I10 Essential (primary) hypertension: Secondary | ICD-10-CM

## 2021-04-17 ENCOUNTER — Other Ambulatory Visit: Payer: Self-pay | Admitting: Family Medicine

## 2021-04-24 IMAGING — DX DG RIBS W/ CHEST 3+V*R*
3 series · 3 of 3 positions shown · non-contrast
Comparison: Chest radiographs dated 09/05/2015

CLINICAL DATA: Posterior lower right rib pain after running into a
cabinet 5 weeks ago.

EXAM:
RIGHT RIBS AND CHEST - 3+ VIEW

[chest pa]
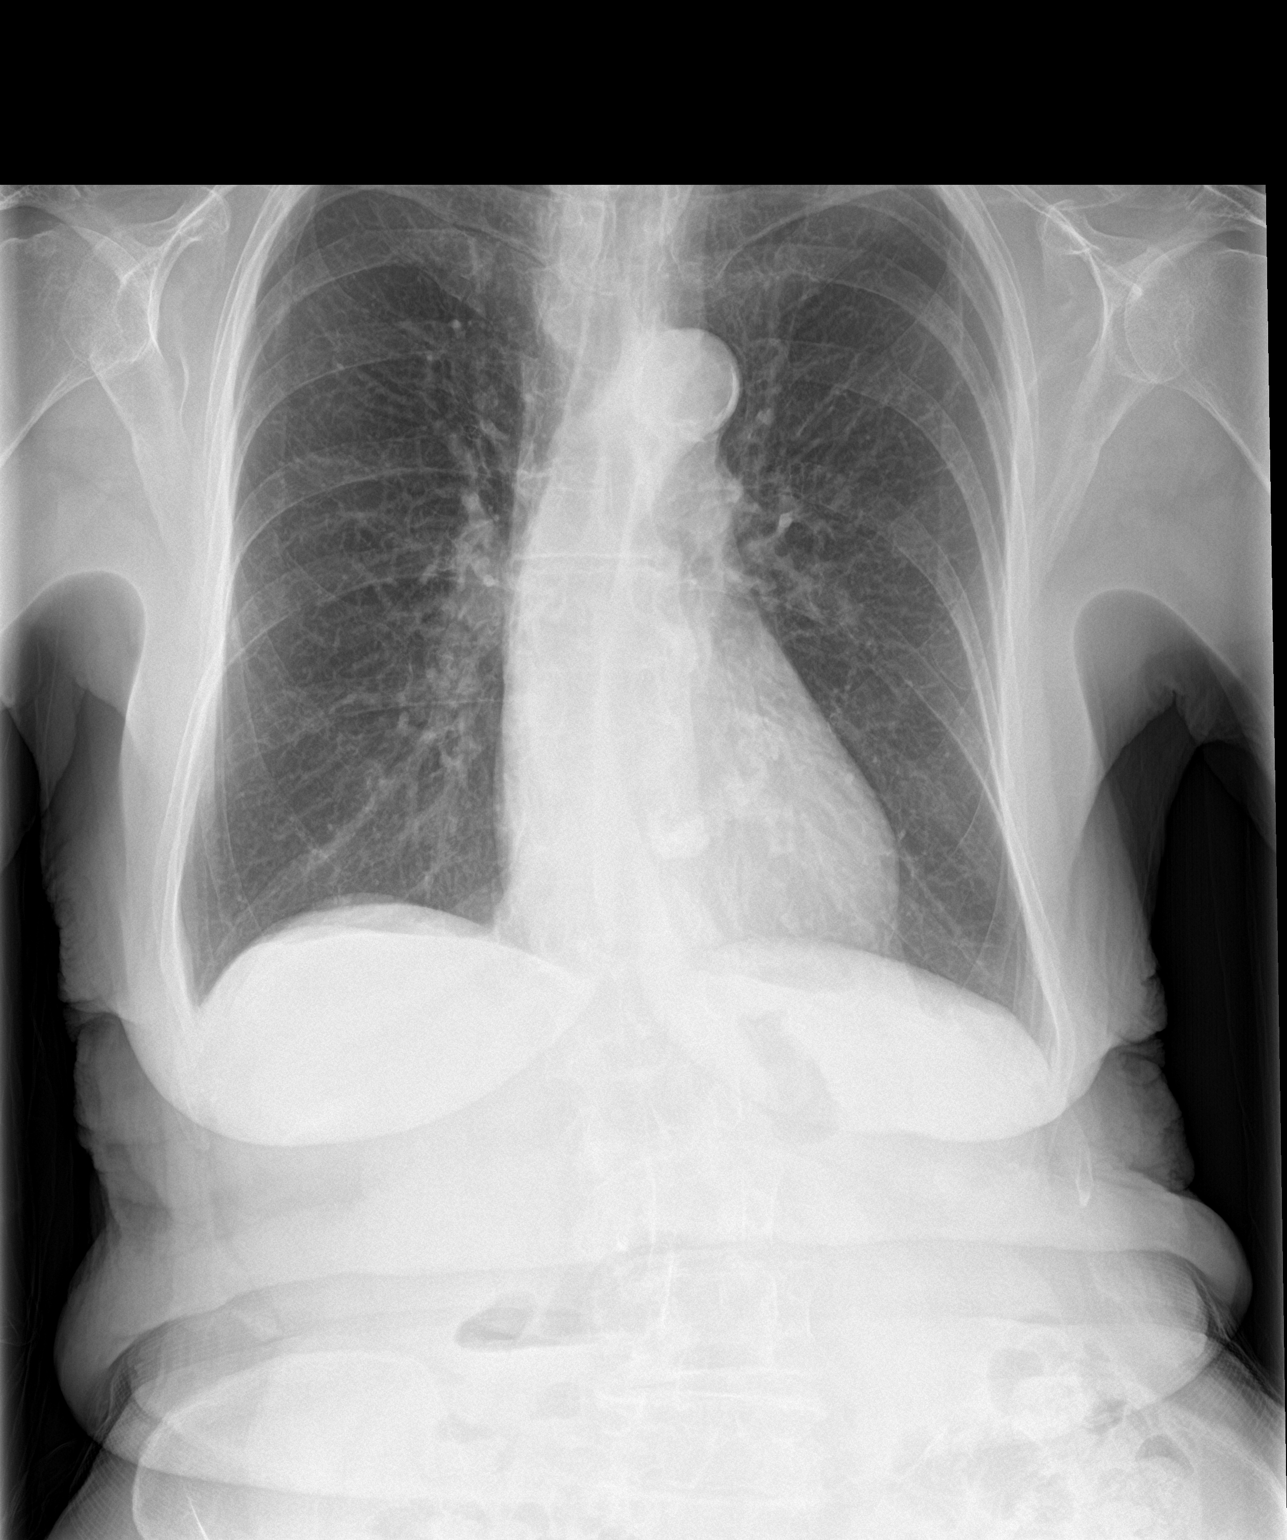

[rib ap]
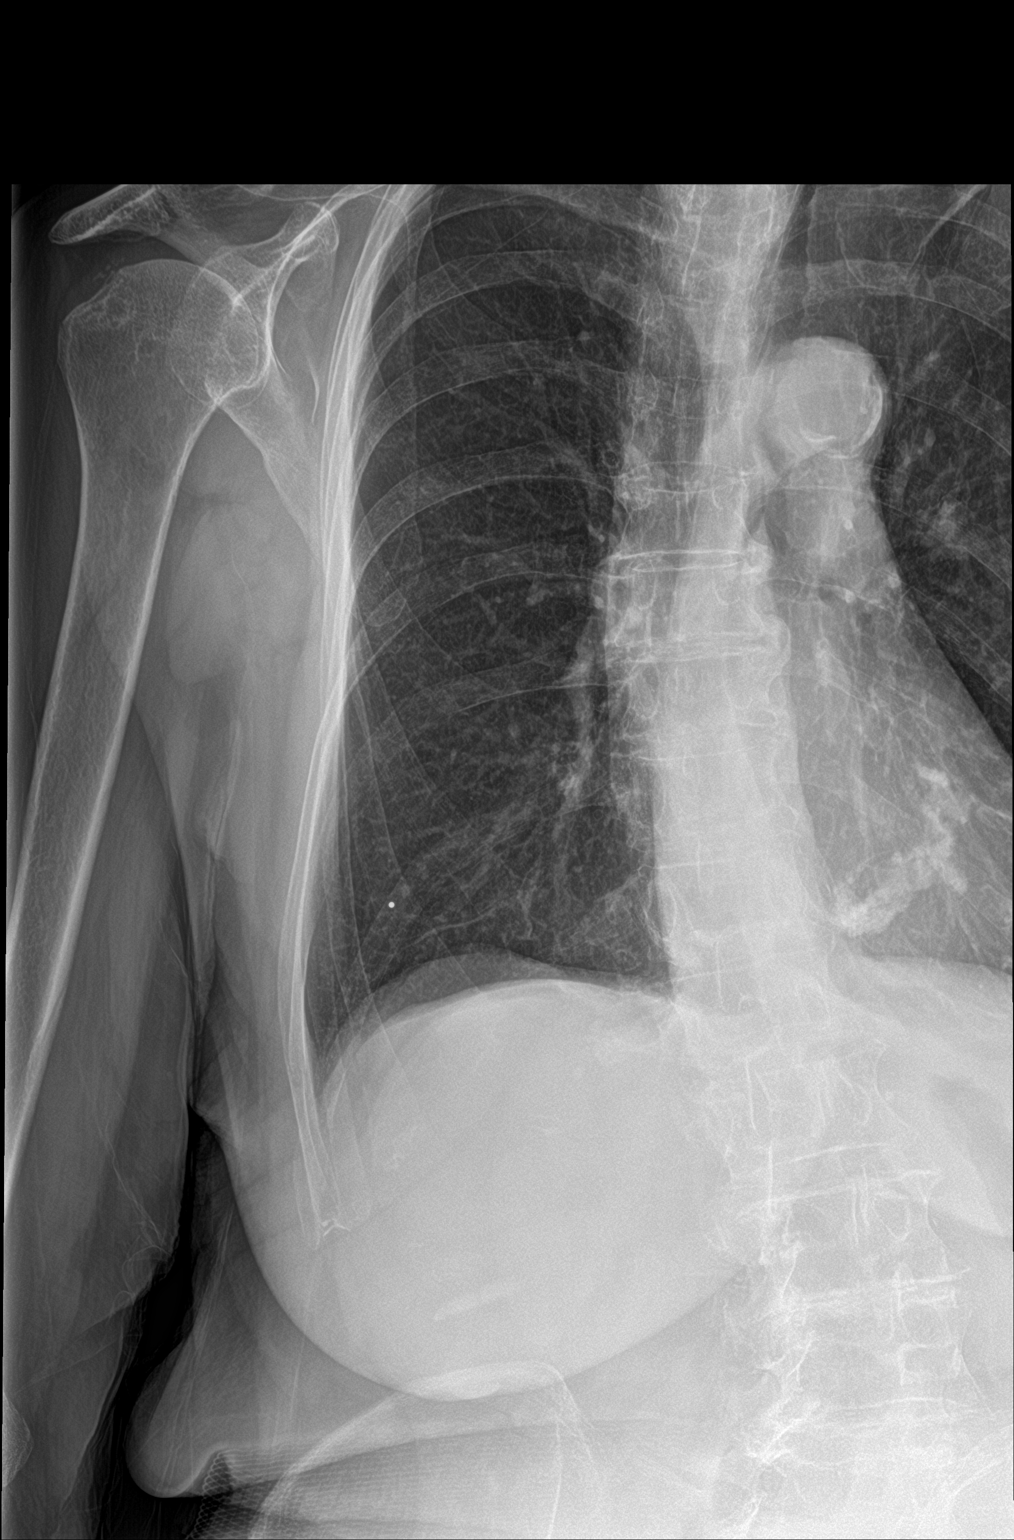

[rib ap obl]
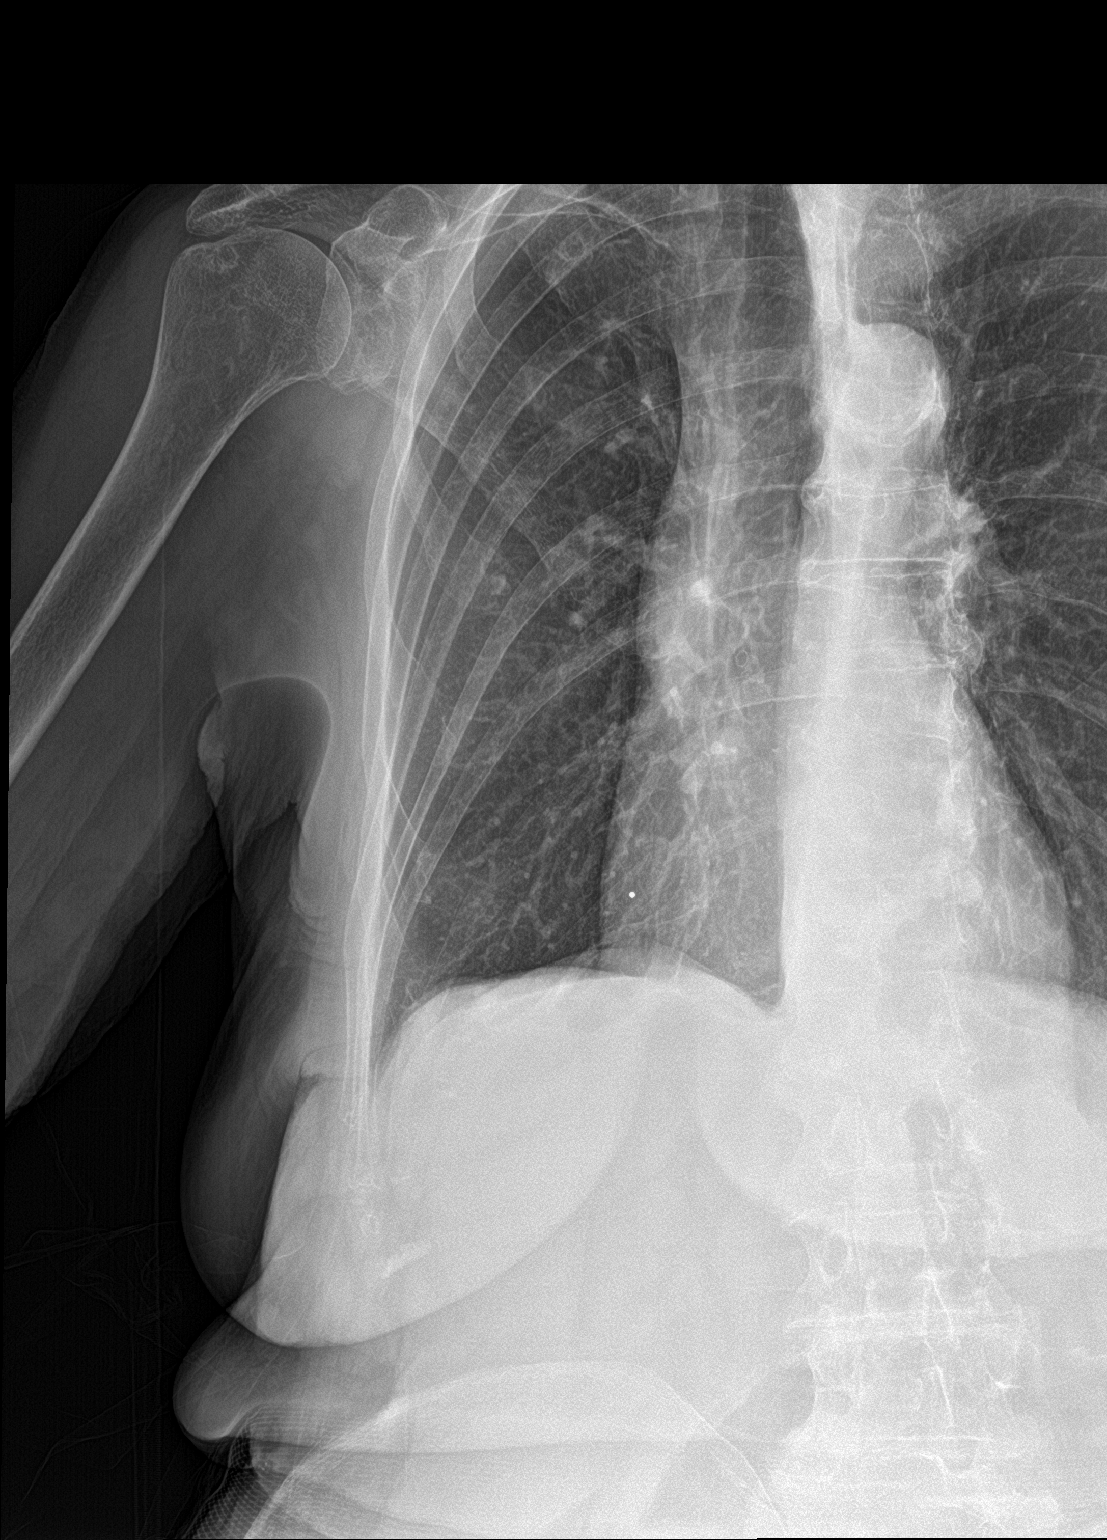

[3 of 3 positions shown; findings below may reference images not displayed]

FINDINGS: Normal sized heart. Clear lungs. The lungs remain hyperexpanded. No
rib fracture or pneumothorax seen. Diffuse osteopenia. Mild
scoliosis.
IMPRESSION: 1. No acute abnormality.
2. Changes of COPD.

## 2021-04-24 IMAGING — DX DG LUMBAR SPINE COMPLETE 4+V
5 series · 5 of 5 positions shown · non-contrast
Comparison: None.

CLINICAL DATA: Low back pain after running into a cabinet
approximately 5 weeks ago.

EXAM:
LUMBAR SPINE - COMPLETE 4+ VIEW

[l-spine ap]
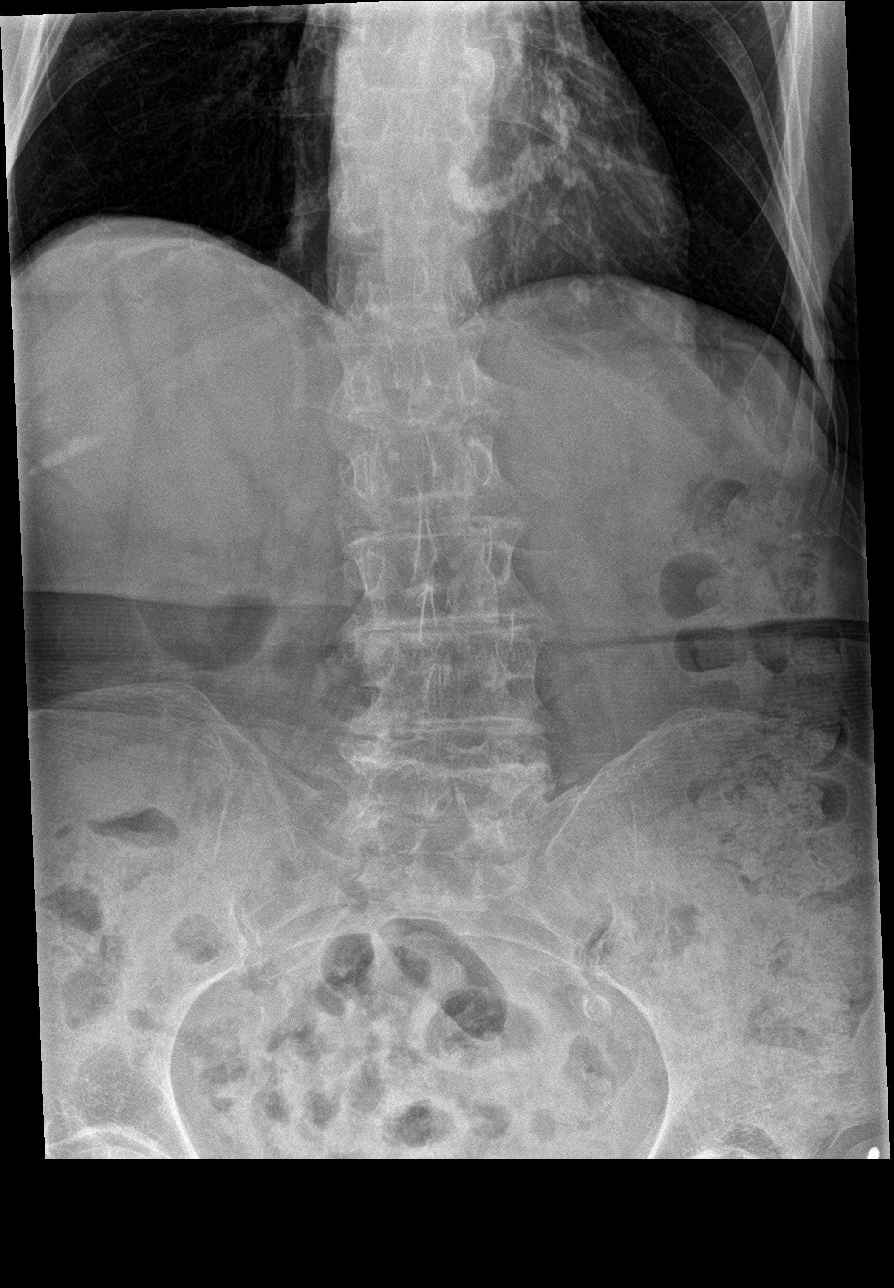

[l-spine obl (1 of 2)]
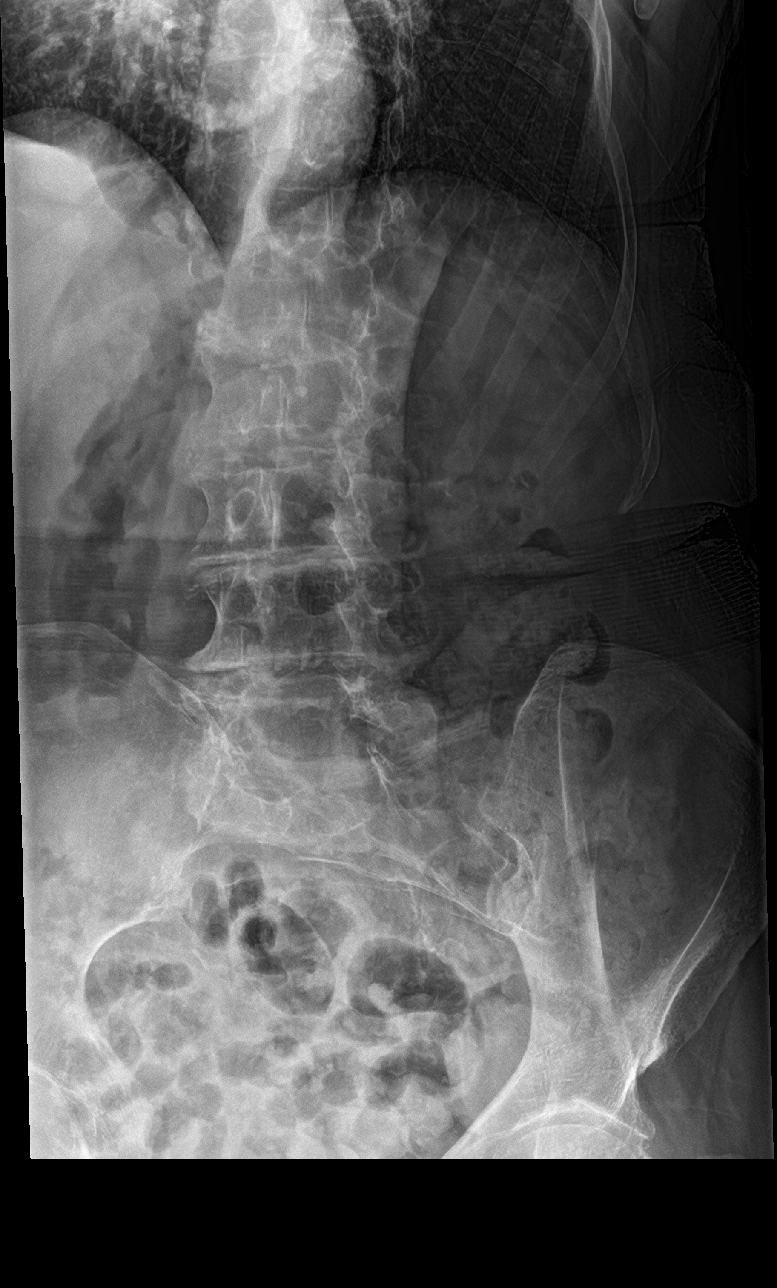

[l-spine obl (2 of 2)]
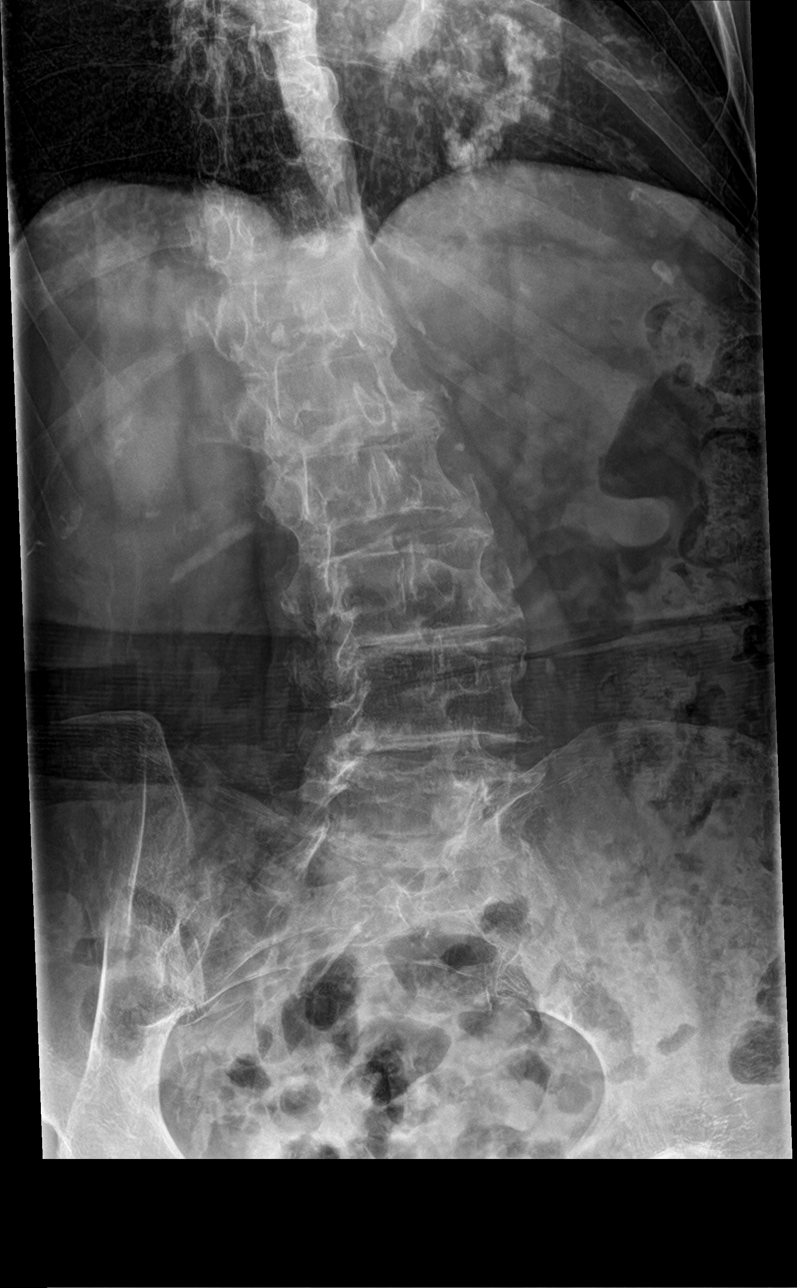

[l-spine lat]
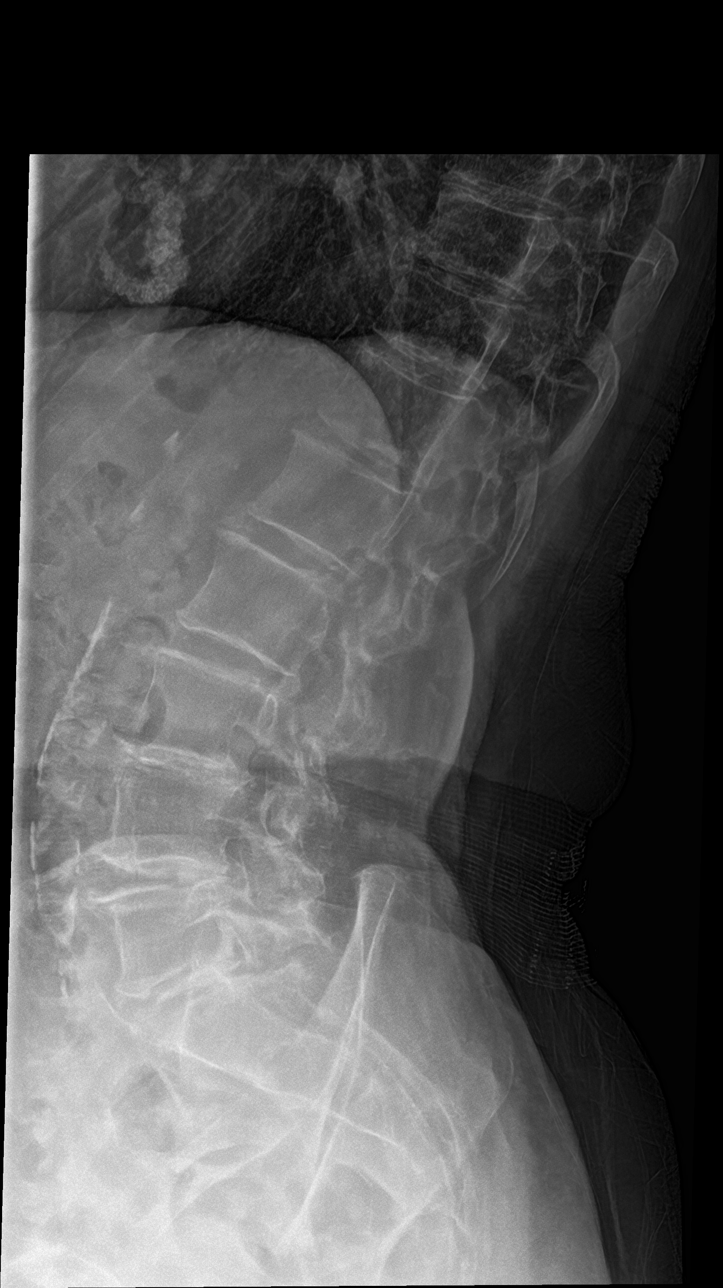

[l-spine spot]
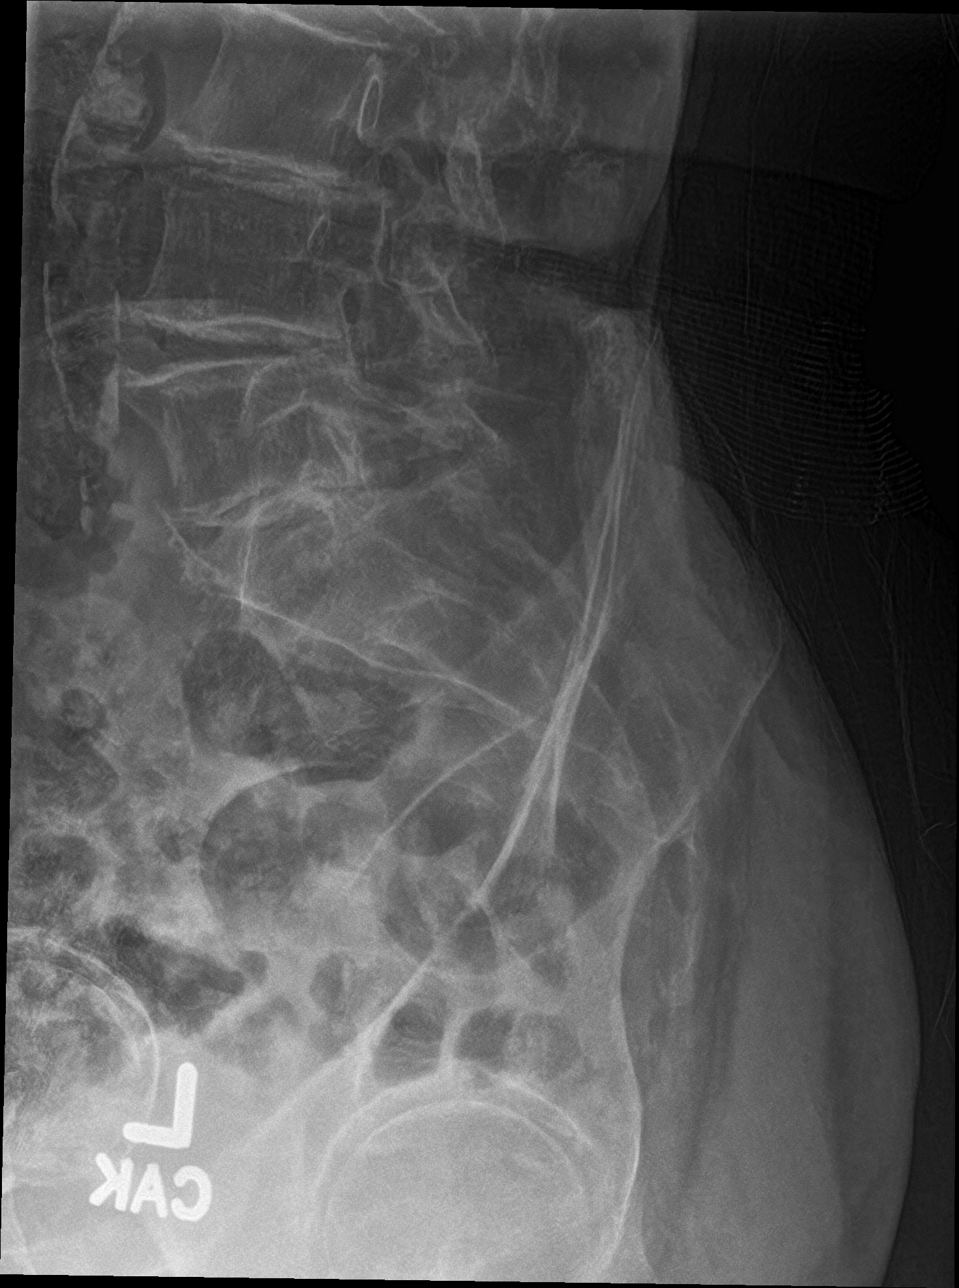

[5 of 5 positions shown; findings below may reference images not displayed]

FINDINGS: Five non-rib-bearing lumbar vertebrae. Mild scoliosis. Mild anterior
and moderate lateral spur formation at multiple levels. No fractures
or subluxations seen. Atheromatous arterial calcifications.
IMPRESSION: 1. No fracture or subluxation.
2. Degenerative changes.

## 2021-04-28 ENCOUNTER — Encounter: Payer: Self-pay | Admitting: Podiatry

## 2021-04-28 ENCOUNTER — Other Ambulatory Visit: Payer: Self-pay

## 2021-04-28 ENCOUNTER — Ambulatory Visit (INDEPENDENT_AMBULATORY_CARE_PROVIDER_SITE_OTHER): Payer: Medicare Other | Admitting: Podiatry

## 2021-04-28 DIAGNOSIS — M79675 Pain in left toe(s): Secondary | ICD-10-CM | POA: Diagnosis not present

## 2021-04-28 DIAGNOSIS — B351 Tinea unguium: Secondary | ICD-10-CM

## 2021-04-28 DIAGNOSIS — M79671 Pain in right foot: Secondary | ICD-10-CM | POA: Diagnosis not present

## 2021-04-28 DIAGNOSIS — L84 Corns and callosities: Secondary | ICD-10-CM

## 2021-04-28 DIAGNOSIS — M79672 Pain in left foot: Secondary | ICD-10-CM

## 2021-04-28 DIAGNOSIS — M79674 Pain in right toe(s): Secondary | ICD-10-CM

## 2021-04-28 NOTE — Progress Notes (Signed)
  Subjective:  Patient ID: [name] [name], female    DOB: 1927-01-28,  MRN: [id number]  [name] presents to clinic today for callus(es) b/l feet and painful thick toenails that are difficult to trim. Painful toenails interfere with ambulation. Aggravating factors include wearing enclosed shoe gear. Pain is relieved with periodic professional debridement. Painful calluses are aggravated when weightbearing with and without shoegear. Pain is relieved with periodic professional debridement.  Her daughter is present during today's visit. [name] is expecting a new great grand-daughter this September.  She voices no new pedal problems on today's visit.  No Known Allergies  Review of Systems: Negative except as noted in the HPI. Objective:   Constitutional [name] is a pleasant 87 y.o. Caucasian female, WD, WN in NAD. AAO x 3.   Vascular Capillary refill time to digits immediate b/l. Palpable pedal pulses b/l LE. Pedal hair sparse. Lower extremity skin temperature gradient within normal limits. No pain with calf compression b/l. No edema noted b/l lower extremities. No cyanosis or clubbing noted.  Neurologic Normal speech. Oriented to person, place, and time. Protective sensation intact 5/5 intact bilaterally with 10g monofilament b/l. Vibratory sensation intact b/l.  Dermatologic Pedal skin with normal turgor, texture and tone bilaterally. No open wounds bilaterally. No interdigital macerations bilaterally. Toenails 1-5 b/l elongated, discolored, dystrophic, thickened, crumbly with subungual debris and tenderness to dorsal palpation. Hyperkeratotic lesion(s) submet head 1 left foot.  No erythema, no edema, no drainage, no fluctuance.  Orthopedic: Normal muscle strength 5/5 to all lower extremity muscle groups bilaterally. No pain crepitus or joint limitation noted with ROM b/l. Hallux valgus with bunion deformity noted b/l lower extremities.   Radiographs: None Assessment:   1. Pain  due to onychomycosis of toenails of both feet   2. Callus   3. Pain in both feet    Plan:  Patient was evaluated and treated and all questions answered.  Onychomycosis with pain -Nails palliatively debridement as below -Educated on self-care  Procedure: Nail Debridement Rationale: Pain Type of Debridement: manual, sharp debridement. Instrumentation: Nail nipper, rotary burr. Number of Nails: 10 -Examined patient. -Patient to continue soft, supportive shoe gear daily. -Toenails 1-5 b/l were debrided in length and girth with sterile nail nippers and dremel without iatrogenic bleeding.  -Callus(es) submet head 1 left foot pared utilizing sterile scalpel blade without complication or incident. Total number debrided =1. -Patient to report any pedal injuries to medical professional immediately. -Patient/POA to call should there be question/concern in the interim.  No follow-ups on file.  [name], DPM

## 2021-05-23 DIAGNOSIS — H43811 Vitreous degeneration, right eye: Secondary | ICD-10-CM | POA: Diagnosis not present

## 2021-05-23 DIAGNOSIS — H353134 Nonexudative age-related macular degeneration, bilateral, advanced atrophic with subfoveal involvement: Secondary | ICD-10-CM | POA: Diagnosis not present

## 2021-06-16 ENCOUNTER — Other Ambulatory Visit: Payer: Self-pay | Admitting: *Deleted

## 2021-06-16 DIAGNOSIS — I10 Essential (primary) hypertension: Secondary | ICD-10-CM

## 2021-06-16 MED ORDER — LISINOPRIL 20 MG PO TABS
20.0000 mg | ORAL_TABLET | Freq: Every day | ORAL | 0 refills | Status: DC
Start: 2021-06-16 — End: 2021-07-14

## 2021-07-13 ENCOUNTER — Other Ambulatory Visit: Payer: Self-pay | Admitting: Family Medicine

## 2021-07-13 DIAGNOSIS — I10 Essential (primary) hypertension: Secondary | ICD-10-CM

## 2021-07-24 ENCOUNTER — Other Ambulatory Visit: Payer: Self-pay

## 2021-07-24 ENCOUNTER — Ambulatory Visit (INDEPENDENT_AMBULATORY_CARE_PROVIDER_SITE_OTHER): Payer: Medicare Other | Admitting: Podiatry

## 2021-07-24 ENCOUNTER — Encounter: Payer: Self-pay | Admitting: Podiatry

## 2021-07-24 DIAGNOSIS — M79675 Pain in left toe(s): Secondary | ICD-10-CM | POA: Diagnosis not present

## 2021-07-24 DIAGNOSIS — M79672 Pain in left foot: Secondary | ICD-10-CM

## 2021-07-24 DIAGNOSIS — M79671 Pain in right foot: Secondary | ICD-10-CM

## 2021-07-24 DIAGNOSIS — B351 Tinea unguium: Secondary | ICD-10-CM

## 2021-07-24 DIAGNOSIS — L84 Corns and callosities: Secondary | ICD-10-CM | POA: Diagnosis not present

## 2021-07-24 DIAGNOSIS — M79674 Pain in right toe(s): Secondary | ICD-10-CM | POA: Diagnosis not present

## 2021-07-27 ENCOUNTER — Encounter: Payer: Self-pay | Admitting: Podiatry

## 2021-07-27 NOTE — Progress Notes (Signed)
Subjective: [name] [name] is a pleasant 87 y.o. female patient seen today for thick, elongated toenails b/l feet which are tender when wearing enclosed shoe gear.   PCP is Zola Button, Lavelle R, DO. Last visit was: 09/09/2020.  Her daughter is present during today's visit. They voice no new pedal concerns on today.  No Known Allergies  Objective: Physical Exam  General: [name] [name] is a pleasant 87 y.o. Caucasian female, WD, WN in NAD. AAO x 3.   Vascular:  Capillary refill time to digits immediate b/l. Palpable DP pulse(s) b/l lower extremities Palpable PT pulse(s) b/l lower extremities Pedal hair sparse. Lower extremity skin temperature gradient within normal limits. No pain with calf compression b/l.  Dermatological:  Skin warm and supple b/l lower extremities. No open wounds b/l lower extremities. No interdigital macerations b/l lower extremities. Toenails 1-5 b/l elongated, discolored, dystrophic, thickened, crumbly with subungual debris and tenderness to dorsal palpation. Hyperkeratotic lesion(s) submet head 1 left foot.  No erythema, no edema, no drainage, no fluctuance.  Musculoskeletal:  Normal muscle strength 5/5 to all lower extremity muscle groups bilaterally. Hallux valgus with bunion deformity noted b/l lower extremities.  Neurological:  Protective sensation intact 5/5 intact bilaterally with 10g monofilament b/l.  Assessment and Plan:  1. Pain due to onychomycosis of toenails of both feet   2. Callus   3. Pain in both feet     -Examined patient. -Patient to continue soft, supportive shoe gear daily. -Toenails 1-5 b/l were debrided in length and girth with sterile nail nippers and dremel without iatrogenic bleeding.  -Callus(es) submet head 1 left foot pared utilizing sterile scalpel blade without complication or incident. Total number debrided =1. -Patient to report any pedal injuries to medical professional immediately. -Patient/POA to call should there be  question/concern in the interim.  Return in about 3 months (around 10/23/2021).  [name], DPM

## 2021-08-17 ENCOUNTER — Other Ambulatory Visit: Payer: Self-pay | Admitting: Family Medicine

## 2021-08-17 DIAGNOSIS — I10 Essential (primary) hypertension: Secondary | ICD-10-CM

## 2021-10-02 ENCOUNTER — Other Ambulatory Visit: Payer: Self-pay | Admitting: Family Medicine

## 2021-10-17 ENCOUNTER — Encounter: Payer: Self-pay | Admitting: Family Medicine

## 2021-10-17 ENCOUNTER — Ambulatory Visit (INDEPENDENT_AMBULATORY_CARE_PROVIDER_SITE_OTHER): Payer: Medicare Other

## 2021-10-17 DIAGNOSIS — Z23 Encounter for immunization: Secondary | ICD-10-CM

## 2021-10-20 ENCOUNTER — Other Ambulatory Visit: Payer: Self-pay | Admitting: Family Medicine

## 2021-10-23 ENCOUNTER — Other Ambulatory Visit: Payer: Self-pay

## 2021-10-23 ENCOUNTER — Encounter: Payer: Self-pay | Admitting: Podiatry

## 2021-10-23 ENCOUNTER — Ambulatory Visit (INDEPENDENT_AMBULATORY_CARE_PROVIDER_SITE_OTHER): Payer: Medicare Other | Admitting: Podiatry

## 2021-10-23 DIAGNOSIS — B351 Tinea unguium: Secondary | ICD-10-CM

## 2021-10-23 DIAGNOSIS — M79672 Pain in left foot: Secondary | ICD-10-CM | POA: Diagnosis not present

## 2021-10-23 DIAGNOSIS — M79675 Pain in left toe(s): Secondary | ICD-10-CM

## 2021-10-23 DIAGNOSIS — L84 Corns and callosities: Secondary | ICD-10-CM

## 2021-10-23 DIAGNOSIS — M79674 Pain in right toe(s): Secondary | ICD-10-CM | POA: Diagnosis not present

## 2021-10-23 DIAGNOSIS — M79671 Pain in right foot: Secondary | ICD-10-CM

## 2021-10-27 NOTE — Progress Notes (Signed)
°  Subjective:  Patient ID: [name] [name], female    DOB: 10/27/1927,  MRN: [id number]  [name] presents to clinic today for painful elongated mycotic toenails 1-5 bilaterally which are tender when wearing enclosed shoe gear. Pain is relieved with periodic professional debridement.  Patient's daughter is present during today's visit. They voice no new pedal concerns on today's visit.  PCP is Zola Button, Myrene Buddy R, DO , and last visit was 09/09/2020.  No Known Allergies  Review of Systems: Negative except as noted in the HPI. Objective:   Constitutional [name] [name] is a pleasant 87 y.o. Caucasian female, WD, WN in NAD. AAO x 3.   Vascular CFT immediate b/l LE. Palpable DP/PT pulses b/l LE. Digital hair sparse b/l. Skin temperature gradient WNL b/l. No pain with calf compression b/l. No edema noted b/l. No cyanosis or clubbing noted b/l LE.  Neurologic Normal speech. Oriented to person, place, and time. Protective sensation intact 5/5 intact bilaterally with 10g monofilament b/l.  Dermatologic Pedal integument with normal turgor, texture and tone b/l LE. No open wounds b/l. No interdigital macerations b/l. Toenails 1-5 b/l elongated, thickened, discolored with subungual debris. +Tenderness with dorsal palpation of nailplates. Hyperkeratotic lesion(s) noted submet head 1 b/l.  Orthopedic: Muscle strength 5/5 to all lower extremity muscle groups bilaterally. HAV with bunion deformity noted b/l LE. Utilizes rollator for ambulation assistance.   Radiographs: None   Assessment:  No diagnosis found. Plan:  Patient was evaluated and treated and all questions answered. Consent given for treatment as described below: -Medicare ABN signed for this year. Patient consents for services of paring of calluses by daughter on  today. Copy has been placed in patient's chart. -Toenails 1-5 b/l were debrided in length and girth with sterile nail nippers and dremel without iatrogenic bleeding.   -Callus(es) submet head 1 b/l pared utilizing sterile scalpel blade without complication or incident. Total number debrided =2. -Patient/POA to call should there be question/concern in the interim.  Return in about 3 months (around 01/21/2022).  [name], DPM

## 2021-11-23 ENCOUNTER — Encounter: Payer: Self-pay | Admitting: Family Medicine

## 2021-11-23 ENCOUNTER — Ambulatory Visit (INDEPENDENT_AMBULATORY_CARE_PROVIDER_SITE_OTHER): Payer: Medicare Other | Admitting: Family Medicine

## 2021-11-23 ENCOUNTER — Telehealth: Payer: Self-pay | Admitting: *Deleted

## 2021-11-23 VITALS — BP 132/82 | HR 67 | Temp 98.5°F | Resp 16 | Ht 63.0 in | Wt 110.2 lb

## 2021-11-23 DIAGNOSIS — I10 Essential (primary) hypertension: Secondary | ICD-10-CM | POA: Diagnosis not present

## 2021-11-23 DIAGNOSIS — E039 Hypothyroidism, unspecified: Secondary | ICD-10-CM

## 2021-11-23 DIAGNOSIS — E785 Hyperlipidemia, unspecified: Secondary | ICD-10-CM | POA: Diagnosis not present

## 2021-11-23 DIAGNOSIS — Z0000 Encounter for general adult medical examination without abnormal findings: Secondary | ICD-10-CM | POA: Insufficient documentation

## 2021-11-23 DIAGNOSIS — R413 Other amnesia: Secondary | ICD-10-CM | POA: Diagnosis not present

## 2021-11-23 DIAGNOSIS — Z23 Encounter for immunization: Secondary | ICD-10-CM | POA: Diagnosis not present

## 2021-11-23 LAB — COMPREHENSIVE METABOLIC PANEL
ALT: 23 U/L (ref 0–35)
AST: 24 U/L (ref 0–37)
Albumin: 3.9 g/dL (ref 3.5–5.2)
Alkaline Phosphatase: 77 U/L (ref 39–117)
BUN: 20 mg/dL (ref 6–23)
CO2: 32 mEq/L (ref 19–32)
Calcium: 9.5 mg/dL (ref 8.4–10.5)
Chloride: 105 mEq/L (ref 96–112)
Creatinine, Ser: 0.79 mg/dL (ref 0.40–1.20)
GFR: 64.66 mL/min (ref >60.00)
Glucose, Bld: 82 mg/dL (ref 70–99)
Potassium: 4.9 mEq/L (ref 3.5–5.1)
Sodium: 141 mEq/L (ref 135–145)
Total Bilirubin: 0.8 mg/dL (ref 0.2–1.2)
Total Protein: 6.5 g/dL (ref 6.0–8.3)

## 2021-11-23 LAB — LIPID PANEL
Cholesterol: 221 mg/dL — ABNORMAL HIGH (ref 0–200)
HDL: 91 mg/dL (ref >39.00)
LDL Cholesterol: 117 mg/dL — ABNORMAL HIGH (ref 0–99)
NonHDL: 130.02
Total CHOL/HDL Ratio: 2
Triglycerides: 63 mg/dL (ref 0.0–149.0)
VLDL: 12.6 mg/dL (ref 0.0–40.0)

## 2021-11-23 LAB — CBC WITH DIFFERENTIAL/PLATELET
Basophils Absolute: 0 10*3/uL (ref 0.0–0.1)
Basophils Relative: 0.6 % (ref 0.0–3.0)
Eosinophils Absolute: 0.1 10*3/uL (ref 0.0–0.7)
Eosinophils Relative: 1 % (ref 0.0–5.0)
HCT: 43.6 % (ref 36.0–46.0)
Hemoglobin: 14.2 g/dL (ref 12.0–15.0)
Lymphocytes Relative: 13.9 % (ref 12.0–46.0)
Lymphs Abs: 0.9 10*3/uL (ref 0.7–4.0)
MCHC: 32.5 g/dL (ref 30.0–36.0)
MCV: 94.6 fl (ref 78.0–100.0)
Monocytes Absolute: 0.4 10*3/uL (ref 0.1–1.0)
Monocytes Relative: 6.9 % (ref 3.0–12.0)
Neutro Abs: 4.8 10*3/uL (ref 1.4–7.7)
Neutrophils Relative %: 77.6 % — ABNORMAL HIGH (ref 43.0–77.0)
Platelets: 149 10*3/uL — ABNORMAL LOW (ref 150.0–400.0)
RBC: 4.61 Mil/uL (ref 3.87–5.11)
RDW: 13.8 % (ref 11.5–15.5)
WBC: 6.1 10*3/uL (ref 4.0–10.5)

## 2021-11-23 NOTE — Assessment & Plan Note (Signed)
Well controlled, no changes to meds. Encouraged heart healthy diet such as the DASH diet and exercise as tolerated.  °

## 2021-11-23 NOTE — Progress Notes (Addendum)
Subjective:   By signing my name below, I, Shehryar Baig, attest that this documentation has been prepared under the direction and in the presence of Dr. Roma Schanz, DO. 11/23/2021    Patient ID: [name] [name], female    DOB: Aug 21, 1927, 88 y.o.   MRN: [id number]  Chief Complaint  Patient presents with   Annual Exam    Pt states fasting     HPI Patient is in today for a comprehensive physical exam. He daughter is present during this visit.  Her daughter is requesting a memory exam. Her blood pressure is doing well during this visit. She continues taking 20 mg lisinopril daily PO.  BP Readings from Last 3 Encounters:  11/23/21 132/82  12/08/20 (!) 160/65  09/09/20 139/64   Pulse Readings from Last 3 Encounters:  11/23/21 67  12/08/20 73  09/09/20 71   She denies having any fever, new moles, congestion, sore throat, new muscle pain, new joint pain, chest pain, cough, SOB, wheezing, n/v/d, constipation, blood in stool, dysuria, frequency, hematuria, or headaches at this time. She is due for a pneumonia vaccine and is interested in receiving it. She is UTD on flu vaccines. She was informed of the bivalent Covid-19 vaccine.  She is planning on scheduling and appointment for vision care.    Past Medical History:  Diagnosis Date   Arthritis    "right thumb" (09/05/2015)   Hyperlipidemia    Hypertension    Hypothyroidism    Osteoporosis     Past Surgical History:  Procedure Laterality Date   CATARACT EXTRACTION W/ INTRAOCULAR LENS  IMPLANT, BILATERAL Bilateral 2010   CLOSED REDUCTION SHOULDER DISLOCATION Right    OVARIAN CYST SURGERY  1949   TONSILLECTOMY      Family History  Problem Relation Age of Onset   Cancer Mother        bladder   Cancer Father        unknown   Ovarian cancer Sister    Heart disease Brother        All 5 brothers have had heart issues starting in their 93s   Breast cancer Daughter     Social History   Socioeconomic History    Marital status: Married    Spouse name: Not on file   Number of children: Not on file   Years of education: Not on file   Highest education level: Not on file  Occupational History   Not on file  Tobacco Use   Smoking status: Never   Smokeless tobacco: Never  Substance and Sexual Activity   Alcohol use: No   Drug use: No   Sexual activity: Yes    Partners: Male  Other Topics Concern   Not on file  Social History Narrative   Lives with her husband in a house    2 children close by to help   Social Determinants of Health   Financial Resource Strain: Not on file  Food Insecurity: Not on file  Transportation Needs: Not on file  Physical Activity: Not on file  Stress: Not on file  Social Connections: Not on file  Intimate Partner Violence: Not on file    Outpatient Medications Prior to Visit  Medication Sig Dispense Refill   calcium carbonate (OSCAL) 1500 (600 Ca) MG TABS tablet Take 1 tablet by mouth daily.     levothyroxine (SYNTHROID) 100 MCG tablet TAKE ONE TABLET (100 MCG TOTAL) BY MOUTHDAILY 90 tablet 0   lisinopril (ZESTRIL) 20 MG tablet  Take 1 tablet (20 mg total) by mouth daily. 90 tablet 1   Multiple Vitamins-Minerals (ICAPS AREDS 2 PO) Take 1 tablet by mouth daily.     nitroGLYCERIN (NITROSTAT) 0.4 MG SL tablet Place 1 tablet (0.4 mg total) under the tongue every 5 (five) minutes x 3 doses as needed for chest pain. 25 tablet 3   sodium-potassium bicarbonate (ALKA-SELTZER GOLD) TBEF dissolvable tablet Take 1 tablet by mouth daily as needed (indigestion).     No facility-administered medications prior to visit.    No Known Allergies  Review of Systems  Constitutional:  Negative for fever.  HENT:  Negative for congestion and sore throat.   Respiratory:  Negative for cough, shortness of breath and wheezing.   Cardiovascular:  Negative for chest pain.  Gastrointestinal:  Negative for blood in stool, constipation, diarrhea, nausea and vomiting.  Genitourinary:   Negative for dysuria, frequency and hematuria.  Musculoskeletal:  Negative for joint pain and myalgias.  Skin:        (-)New moles  Neurological:  Negative for headaches.      Objective:    Physical Exam Vitals and nursing note reviewed.  Constitutional:      General: She is not in acute distress.    Appearance: Normal appearance. She is not ill-appearing.  HENT:     Head: Normocephalic and atraumatic.     Right Ear: Tympanic membrane, ear canal and external ear normal.     Left Ear: Tympanic membrane, ear canal and external ear normal. There is impacted cerumen.  Eyes:     Extraocular Movements: Extraocular movements intact.     Pupils: Pupils are equal, round, and reactive to light.  Cardiovascular:     Rate and Rhythm: Normal rate and regular rhythm.     Heart sounds: Normal heart sounds. No murmur heard.   No gallop.  Pulmonary:     Effort: Pulmonary effort is normal. No respiratory distress.     Breath sounds: Normal breath sounds. No wheezing or rales.  Abdominal:     General: Bowel sounds are normal. There is no distension.     Palpations: Abdomen is soft.     Tenderness: There is no abdominal tenderness. There is no guarding.  Musculoskeletal:        General: Normal range of motion.  Skin:    General: Skin is warm and dry.  Neurological:     Mental Status: She is alert. She is disoriented.  Psychiatric:        Mood and Affect: Mood normal.        Behavior: Behavior normal.        Judgment: Judgment normal.     Comments: See mmse    BP 132/82 (BP Location: Left Arm, Patient Position: Sitting, Cuff Size: Normal)    Pulse 67    Temp 98.5 F (36.9 C) (Oral)    Resp 16    Ht 5\' 3"  (1.6 m)    Wt 110 lb 3.2 oz (50 kg)    SpO2 97%    BMI 19.52 kg/m  Wt Readings from Last 3 Encounters:  11/23/21 110 lb 3.2 oz (50 kg)  12/08/20 115 lb (52.2 kg)  09/09/20 115 lb 3.2 oz (52.3 kg)    Diabetic Foot Exam - Simple   No data filed    Lab Results  Component Value Date    WBC 7.4 09/09/2020   HGB 14.6 09/09/2020   HCT 43.7 09/09/2020   PLT 145 09/09/2020   GLUCOSE  95 09/09/2020   CHOL 221 (H) 09/09/2020   TRIG 74 09/09/2020   HDL 86 09/09/2020   LDLDIRECT 157.4 01/29/2011   LDLCALC 118 (H) 09/09/2020   ALT 40 (H) 09/09/2020   AST 33 09/09/2020   NA 141 09/09/2020   K 5.0 09/09/2020   CL 104 09/09/2020   CREATININE 0.82 09/09/2020   BUN 22 09/09/2020   CO2 25 09/09/2020   TSH 3.11 09/09/2020   INR 0.96 09/05/2015    Lab Results  Component Value Date   TSH 3.11 09/09/2020   Lab Results  Component Value Date   WBC 7.4 09/09/2020   HGB 14.6 09/09/2020   HCT 43.7 09/09/2020   MCV 92.6 09/09/2020   PLT 145 09/09/2020   Lab Results  Component Value Date   NA 141 09/09/2020   K 5.0 09/09/2020   CO2 25 09/09/2020   GLUCOSE 95 09/09/2020   BUN 22 09/09/2020   CREATININE 0.82 09/09/2020   BILITOT 0.7 09/09/2020   ALKPHOS 85 01/09/2019   AST 33 09/09/2020   ALT 40 (H) 09/09/2020   PROT 6.8 09/09/2020   ALBUMIN 4.3 01/09/2019   CALCIUM 10.1 09/09/2020   ANIONGAP 9 09/06/2015   GFR 65.47 01/09/2019   Lab Results  Component Value Date   CHOL 221 (H) 09/09/2020   Lab Results  Component Value Date   HDL 86 09/09/2020   Lab Results  Component Value Date   LDLCALC 118 (H) 09/09/2020   Lab Results  Component Value Date   TRIG 74 09/09/2020   Lab Results  Component Value Date   CHOLHDL 2.6 09/09/2020   No results found for: HGBA1C MMSE - Mini Mental State Exam 11/23/2021 09/14/2016  Not completed: Unable to complete -  Orientation to time 4 5  Orientation to Place 3 5  Registration - 3  Attention/ Calculation - 5  Recall 3 3  Language- name 2 objects 2 2  Language- repeat - 1  Language- follow 3 step command 3 3  Language- read & follow direction - 1  Write a sentence - 1  Copy design - 1  Total score - 30     Dexa- Last completed 06/06/2020. Results showed she is osteoporotic. Repeat in 2 years.      Assessment  & Plan:   Problem List Items Addressed This Visit       Unprioritized   Essential hypertension    Well controlled, no changes to meds. Encouraged heart healthy diet such as the DASH diet and exercise as tolerated.       Hyperlipidemia    Encourage heart healthy diet such as MIND or DASH diet, increase exercise, avoid trans fats, simple carbohydrates and processed foods, consider a krill or fish or flaxseed oil cap daily.       Hypothyroidism    Check labs       Memory loss    Refer to neuro psych Pt daughter mentioned memory problems to cma--- she did not want to discuss it a lot in the room       Relevant Orders   Ambulatory referral to Neuropsychology   Preventative health care - Primary    ghm utd Pneum shot today See avs       Other Visit Diagnoses     Hypertension, unspecified type       Relevant Orders   AMB Referral to Community Care Coordinaton   CBC with Differential/Platelet   Comprehensive metabolic panel   Lipid panel  Need for pneumococcal vaccination       Relevant Orders   Pneumococcal conjugate vaccine 20-valent (Prevnar 20) (Completed)        No orders of the defined types were placed in this encounter.   I, Dr. Roma Schanz, DO, personally preformed the services described in this documentation.  All medical record entries made by the scribe were at my direction and in my presence.  I have reviewed the chart and discharge instructions (if applicable) and agree that the record reflects my personal performance and is accurate and complete. 11/23/2021   I,Shehryar Baig,acting as a scribe for Ann Held, DO.,have documented all relevant documentation on the behalf of Ann Held, DO,as directed by  Ann Held, DO while in the presence of Ann Held, DO.   Ann Held, DO

## 2021-11-23 NOTE — Assessment & Plan Note (Signed)
Encourage heart healthy diet such as MIND or DASH diet, increase exercise, avoid trans fats, simple carbohydrates and processed foods, consider a krill or fish or flaxseed oil cap daily.  °

## 2021-11-23 NOTE — Assessment & Plan Note (Signed)
Check labs 

## 2021-11-23 NOTE — Patient Instructions (Signed)
Preventive Care 65 Years and Older, Female °Preventive care refers to lifestyle choices and visits with your health care provider that can promote health and wellness. Preventive care visits are also called wellness exams. °What can I expect for my preventive care visit? °Counseling °Your health care provider may ask you questions about your: °Medical history, including: °Past medical problems. °Family medical history. °Pregnancy and menstrual history. °History of falls. °Current health, including: °Memory and ability to understand (cognition). °Emotional well-being. °Home life and relationship well-being. °Sexual activity and sexual health. °Lifestyle, including: °Alcohol, nicotine or tobacco, and drug use. °Access to firearms. °Diet, exercise, and sleep habits. °Work and work environment. °Sunscreen use. °Safety issues such as seatbelt and bike helmet use. °Physical exam °Your health care provider will check your: °Height and weight. These may be used to calculate your BMI (body mass index). BMI is a measurement that tells if you are at a healthy weight. °Waist circumference. This measures the distance around your waistline. This measurement also tells if you are at a healthy weight and may help predict your risk of certain diseases, such as type 2 diabetes and high blood pressure. °Heart rate and blood pressure. °Body temperature. °Skin for abnormal spots. °What immunizations do I need? °Vaccines are usually given at various ages, according to a schedule. Your health care provider will recommend vaccines for you based on your age, medical history, and lifestyle or other factors, such as travel or where you work. °What tests do I need? °Screening °Your health care provider may recommend screening tests for certain conditions. This may include: °Lipid and cholesterol levels. °Hepatitis C test. °Hepatitis B test. °HIV (human immunodeficiency virus) test. °STI (sexually transmitted infection) testing, if you are at  risk. °Lung cancer screening. °Colorectal cancer screening. °Diabetes screening. This is done by checking your blood sugar (glucose) after you have not eaten for a while (fasting). °Mammogram. Talk with your health care provider about how often you should have regular mammograms. °BRCA-related cancer screening. This may be done if you have a family history of breast, ovarian, tubal, or peritoneal cancers. °Bone density scan. This is done to screen for osteoporosis. °Talk with your health care provider about your test results, treatment options, and if necessary, the need for more tests. °Follow these instructions at home: °Eating and drinking ° °Eat a diet that includes fresh fruits and vegetables, whole grains, lean protein, and low-fat dairy products. Limit your intake of foods with high amounts of sugar, saturated fats, and salt. °Take vitamin and mineral supplements as recommended by your health care provider. °Do not drink alcohol if your health care provider tells you not to drink. °If you drink alcohol: °Limit how much you have to 0-1 drink a day. °Know how much alcohol is in your drink. In the [place] one drink equals one 12 oz bottle of beer (355 mL), one 5 oz glass of wine (148 mL), or one 1½ oz glass of hard liquor (44 mL). °Lifestyle °Brush your teeth every morning and night with fluoride toothpaste. Floss one time each day. °Exercise for at least 30 minutes 5 or more days each week. °Do not use any products that contain nicotine or tobacco. These products include cigarettes, chewing tobacco, and vaping devices, such as e-cigarettes. If you need help quitting, ask your health care provider. °Do not use drugs. °If you are sexually active, practice safe sex. Use a condom or other form of protection in order to prevent STIs. °Take aspirin only as told by your   health care provider. Make sure that you understand how much to take and what form to take. Work with your health care provider to find out whether it  is safe and beneficial for you to take aspirin daily. Ask your health care provider if you need to take a cholesterol-lowering medicine (statin). Find healthy ways to manage stress, such as: Meditation, yoga, or listening to music. Journaling. Talking to a trusted person. Spending time with friends and family. Minimize exposure to UV radiation to reduce your risk of skin cancer. Safety Always wear your seat belt while driving or riding in a vehicle. Do not drive: If you have been drinking alcohol. Do not ride with someone who has been drinking. When you are tired or distracted. While texting. If you have been using any mind-altering substances or drugs. Wear a helmet and other protective equipment during sports activities. If you have firearms in your house, make sure you follow all gun safety procedures. What's next? Visit your health care provider once a year for an annual wellness visit. Ask your health care provider how often you should have your eyes and teeth checked. Stay up to date on all vaccines. This information is not intended to replace advice given to you by your health care provider. Make sure you discuss any questions you have with your health care provider. Document Revised: 04/26/2021 Document Reviewed: 04/26/2021 Elsevier Patient Education  [place].

## 2021-11-23 NOTE — Assessment & Plan Note (Signed)
Refer to neuro psych Pt daughter mentioned memory problems to cma--- she did not want to discuss it a lot in the room

## 2021-11-23 NOTE — Chronic Care Management (AMB) (Signed)
Chronic Care Management   Note  11/23/2021 Name: [name] [name] MRN: [id number] DOB: 04-09-1927  [name] [name] is a 88 y.o. year old female who is a primary care patient of Ann Held, DO. I reached out to [name] by phone today in response to a referral sent by [name]'s PCP.  [name] was given information about Chronic Care Management services today including:  CCM service includes personalized support from designated clinical staff supervised by her physician, including individualized plan of care and coordination with other care providers 24/7 contact phone numbers for assistance for urgent and routine care needs. Service will only be billed when office clinical staff spend 20 minutes or more in a month to coordinate care. Only one practitioner may furnish and bill the service in a calendar month. The patient may stop CCM services at any time (effective at the end of the month) by phone call to the office staff. The patient is responsible for co-pay (up to 20% after annual deductible is met) if co-pay is required by the individual health plan.   Patient agreed to services and verbal consent obtained.   Follow up plan: Telephone appointment with care management team member scheduled for: 12/08/2021  Julian Hy, [place] [facility]  Direct Dial: [phone]

## 2021-11-23 NOTE — Assessment & Plan Note (Signed)
ghm utd Pneum shot today See avs

## 2021-11-26 ENCOUNTER — Other Ambulatory Visit: Payer: Self-pay | Admitting: Family Medicine

## 2021-11-26 DIAGNOSIS — E785 Hyperlipidemia, unspecified: Secondary | ICD-10-CM

## 2021-12-05 ENCOUNTER — Telehealth: Payer: Self-pay | Admitting: Pharmacist

## 2021-12-05 NOTE — Chronic Care Management (AMB) (Signed)
° ° °  Chronic Care Management Pharmacy Assistant   Name: [name] [name]  MRN: [id number] DOB: 07/03/1927  [name] [name] is an 88 y.o. year old female who presents for his initial CCM visit with the clinical pharmacist.   Recent office visits:  11/23/2021-Yvonne R. Zola Button, DO (PCP) Annual exam. Ambulatory referral to Neuropsychology. Pneumococcal conjugate vaccine given. Follow up in 3 months.  Recent consult visits:  10/23/2021-(Podiatry) [name], DPM. Seen for painful enlongated mycotic toenails. Follow up in 3 months. 07/24/21-(Podiatry) [name], DPM. Seen for elongated toenails. Follow up in 3 months.  Hospital visits:  None in previous 6 months  Medications: Outpatient Encounter Medications as of 12/05/2021  Medication Sig   calcium carbonate (OSCAL) 1500 (600 Ca) MG TABS tablet Take 1 tablet by mouth daily.   levothyroxine (SYNTHROID) 100 MCG tablet TAKE ONE TABLET (100 MCG TOTAL) BY MOUTHDAILY   lisinopril (ZESTRIL) 20 MG tablet Take 1 tablet (20 mg total) by mouth daily.   Multiple Vitamins-Minerals (ICAPS AREDS 2 PO) Take 1 tablet by mouth daily.   nitroGLYCERIN (NITROSTAT) 0.4 MG SL tablet Place 1 tablet (0.4 mg total) under the tongue every 5 (five) minutes x 3 doses as needed for chest pain.   sodium-potassium bicarbonate (ALKA-SELTZER GOLD) TBEF dissolvable tablet Take 1 tablet by mouth daily as needed (indigestion).   No facility-administered encounter medications on file as of 12/05/2021.   Calcium carbonate (OSCAL) 1500 (600 Ca) MG TABS tablet Last filled:None noted Levothyroxine (SYNTHROID) 100 MCG tablet Last filled:10/02/21 90 DS Lisinopril (ZESTRIL) 20 MG tablet Last filled:10/16/21 30 DS NitroGLYCERIN (NITROSTAT) 0.4 MG SL tablet Last filled:None noted Sodium-potassium bicarbonate (ALKA-SELTZER GOLD) TBEF dissolvable tablet Last filled:None noted   Care Gaps: TETANUS/TDAP:Never done Zoster Vaccines- Shingrix:Never done COVID-19  Vaccine:Last completed: Sep 30, 2020  Star Rating Drugs: Lisinopril (ZESTRIL) 20 MG tablet Last filled:10/16/21 30 DS  Myriam Carolin Coy, RMA Health Concierge

## 2021-12-08 ENCOUNTER — Ambulatory Visit (INDEPENDENT_AMBULATORY_CARE_PROVIDER_SITE_OTHER): Payer: Medicare Other | Admitting: Pharmacist

## 2021-12-08 DIAGNOSIS — E785 Hyperlipidemia, unspecified: Secondary | ICD-10-CM

## 2021-12-08 DIAGNOSIS — E039 Hypothyroidism, unspecified: Secondary | ICD-10-CM

## 2021-12-08 DIAGNOSIS — R413 Other amnesia: Secondary | ICD-10-CM

## 2021-12-08 DIAGNOSIS — I10 Essential (primary) hypertension: Secondary | ICD-10-CM

## 2021-12-08 NOTE — Progress Notes (Signed)
Chronic Care Management Pharmacy Note  12/08/2021 Name:  DEXTER SAUSER MRN:  [id number] DOB:  1927/09/06  Summary: Discussed hyperlipidemia - patient declines statin. Discussed low fat diet and limiting sausage and bacon intake.  Discussed osteoporosis - patient declines restart of bisphosphonate or trial of prolia. Consider rechecking DEXA after 06/07/2022. Memory changes - consider checking B12 Hypothyroidism - last TSH 08/2020 - ordered TSH/ Continue current dose of levothyroxine.  Consulted with pharmacy to fill lisinopril for 90 days per fill.  Subjective: TINITA BROOKER is an 88 y.o. year old female who is a primary patient of Ann Held, DO.  The CCM team was consulted for assistance with disease management and care coordination needs.    Engaged with patient and her daughter, Malachy Mood by telephone for initial visit in response to provider referral for pharmacy case management and/or care coordination services.   Consent to Services:  The patient was given the following information about Chronic Care Management services today, agreed to services, and gave verbal consent: 1. CCM service includes personalized support from designated clinical staff supervised by the primary care provider, including individualized plan of care and coordination with other care providers 2. 24/7 contact phone numbers for assistance for urgent and routine care needs. 3. Service will only be billed when office clinical staff spend 20 minutes or more in a month to coordinate care. 4. Only one practitioner may furnish and bill the service in a calendar month. 5.The patient may stop CCM services at any time (effective at the end of the month) by phone call to the office staff. 6. The patient will be responsible for cost sharing (co-pay) of up to 20% of the service fee (after annual deductible is met). Patient agreed to services and consent obtained.  Patient Care Team: Carollee Herter, Alferd Apa, DO as PCP -  General Druscilla Brownie, MD as Referring Physician (Dermatology) Glennie Isle PA-C (Physician Assistant) Cherre Robins, [place] (Pharmacist)  Recent office visits:  11/23/2021-Yvonne Gibson Ramp, DO (PCP) Annual exam. Ambulatory referral to Neuropsychology. Pneumococcal conjugate vaccine given. Follow up in 3 months.   Recent consult visits:  10/23/2021-(Podiatry) Marzetta Board, DPM. Seen for painful enlongated mycotic toenails. Follow up in 3 months. 07/24/21-(Podiatry) Marzetta Board, DPM. Seen for elongated toenails. Follow up in 3 months.   Hospital visits:  None in previous 6 months   Objective:  Lab Results  Component Value Date   CREATININE 0.79 11/23/2021   CREATININE 0.82 09/09/2020   CREATININE 0.82 01/09/2019    No results found for: HGBA1C Last diabetic Eye exam: No results found for: HMDIABEYEEXA  Last diabetic Foot exam: No results found for: HMDIABFOOTEX      Component Value Date/Time   CHOL 221 (H) 11/23/2021 1011   TRIG 63.0 11/23/2021 1011   HDL 91.00 11/23/2021 1011   CHOLHDL 2 11/23/2021 1011   VLDL 12.6 11/23/2021 1011   LDLCALC 117 (H) 11/23/2021 1011   LDLCALC 118 (H) 09/09/2020 1119   LDLDIRECT 157.4 01/29/2011 0959    Hepatic Function Latest Ref Rng & Units 11/23/2021 09/09/2020 01/09/2019  Total Protein 6.0 - 8.3 g/dL 6.5 6.8 7.0  Albumin 3.5 - 5.2 g/dL 3.9 - 4.3  AST 0 - 37 U/L 24 33 26  ALT 0 - 35 U/L 23 40(H) 28  Alk Phosphatase 39 - 117 U/L 77 - 85  Total Bilirubin 0.2 - 1.2 mg/dL 0.8 0.7 0.6  Bilirubin, Direct 0.0 - 0.3 mg/dL - - -  Lab Results  Component Value Date/Time   TSH 3.11 09/09/2020 11:19 AM   TSH 4.08 05/07/2019 10:34 AM   FREET4 1.06 09/05/2015 06:53 PM   FREET4 1.2 09/20/2008 03:21 PM    CBC Latest Ref Rng & Units 11/23/2021 09/09/2020 01/09/2019  WBC 4.0 - 10.5 K/uL 6.1 7.4 7.7  Hemoglobin 12.0 - 15.0 g/dL 14.2 14.6 15.3(H)  Hematocrit 36.0 - 46.0 % 43.6 43.7 45.5  Platelets 150.0 - 400.0  K/uL 149.0(L) 145 168.0    Lab Results  Component Value Date/Time   VD25OH 34 11/02/2010 08:23 PM    Clinical ASCVD: No  The ASCVD Risk score (Arnett DK, et al., 2019) failed to calculate for the following reasons:   The 2019 ASCVD risk score is only valid for ages 73 to 64      Social History   Tobacco Use  Smoking Status Never  Smokeless Tobacco Never   BP Readings from Last 3 Encounters:  11/23/21 132/82  12/08/20 (!) 160/65  09/09/20 139/64   Pulse Readings from Last 3 Encounters:  11/23/21 67  12/08/20 73  09/09/20 71   Wt Readings from Last 3 Encounters:  11/23/21 110 lb 3.2 oz (50 kg)  12/08/20 115 lb (52.2 kg)  09/09/20 115 lb 3.2 oz (52.3 kg)    Assessment: Review of patient past medical history, allergies, medications, health status, including review of consultants reports, laboratory and other test data, was performed as part of comprehensive evaluation and provision of chronic care management services.   SDOH:  (Social Determinants of Health) assessments and interventions performed:  SDOH Interventions    Flowsheet Row Most Recent Value  SDOH Interventions   Financial Strain Interventions Intervention Not Indicated  Physical Activity Interventions Other (Comments)  [not stable enough to do exercise but is active in her home,  "doesn't sit down"]  Transportation Interventions Other (Comment), Intervention Not Indicated  [does not drive but family provides transportation]       CCM Care Plan  No Known Allergies  Medications Reviewed Today     Reviewed by Cherre Robins, RPH-CPP (Pharmacist) on 12/08/21 at [facility] List Status: <None>   Medication Order Taking? Sig Documenting Provider Last Dose Status Informant  calcium carbonate (OSCAL) 1500 (600 Ca) MG TABS tablet [id number] Yes Take 1 tablet by mouth daily. [provider] Taking Active Self  levothyroxine (SYNTHROID) 100 MCG tablet [id number] Yes TAKE ONE TABLET (100 MCG TOTAL) BY  MOUTHDAILY Ann Held, DO Taking Active   lisinopril (ZESTRIL) 20 MG tablet [id number] Yes Take 1 tablet (20 mg total) by mouth daily. Ann Held, DO Taking Active   Multiple Vitamins-Minerals (ICAPS AREDS 2 PO) [id number] Yes Take 1 tablet by mouth in the morning and at bedtime. [provider] Taking Active Self  nitroGLYCERIN (NITROSTAT) 0.4 MG SL tablet [id number] No Place 1 tablet (0.4 mg total) under the tongue every 5 (five) minutes x 3 doses as needed for chest pain.  Patient not taking: Reported on 12/08/2021   Almyra Deforest, [place] Not Taking Active   sodium-potassium bicarbonate (ALKA-SELTZER GOLD) TBEF dissolvable tablet [id number] Yes Take 1 tablet by mouth daily as needed (indigestion). [provider] Taking Active Self            Patient Active Problem List   Diagnosis Date Noted   Preventative health care 11/23/2021   Memory loss 11/23/2021   Bruising 09/09/2020   Chronic right-sided low back pain without sciatica 06/03/2020   Acute mid  back pain 06/03/2020   Weight decrease 08/04/2018   Anxiety 08/04/2018   Slow transit constipation 08/04/2018   Chest pain 09/05/2015   Hyperlipidemia 09/05/2015   CKD (chronic kidney disease), stage II-III 09/05/2015   Abnormal EKG 09/05/2015   HEMATURIA UNSPECIFIED 11/01/2009   BACK PAIN 11/01/2009   Hypothyroidism 07/11/2008   CERUMEN IMPACTION, BILATERAL 07/01/2008   DIZZINESS 07/01/2008   Essential hypertension 01/15/2008   KNEE PAIN, RIGHT 01/15/2008   Osteoporosis 01/15/2008   TONSILLECTOMY AND ADENOIDECTOMY, HX OF 01/15/2008    Immunization History  Administered Date(s) Administered   Fluad Quad(high Dose 65+) 08/21/2019, 09/09/2020, 10/17/2021   Influenza, High Dose Seasonal PF 08/04/2018   PFIZER(Purple Top)SARS-COV-2 Vaccination 12/17/2019, 01/06/2020, 09/30/2020   PNEUMOCOCCAL CONJUGATE-20 11/23/2021   Pneumococcal Conjugate-13 01/09/2019    Conditions to be  addressed/monitored: HTN, HLD, Hypothyroidism, Osteoporosis, and memory changes  Care Plan : General Pharmacy (Adult)  Updates made by Cherre Robins, RPH-CPP since 12/08/2021 12:00 AM     Problem: Chronic Conditions: HTN; Hyperlipidemia; osteoporosis; hearing loss; hypothyroidism; memory changes   Priority: Medium  Onset Date: 12/08/2021     Long-Range Goal: Provide education, support and care coordination for medication therapy and chronic conditions   Start Date: 12/08/2021  Priority: Low  Note:   Current Barriers:  Unable to achieve control of hyperlipidemia  Suboptimal therapeutic regimen for osteoporosis  Pharmacist Clinical Goal(s):  Over the next 90 days, patient will achieve adherence to monitoring guidelines and medication adherence to achieve therapeutic efficacy Consider therapy for hyperlipidemia and osteoporosis  through collaboration with PharmD and provider.  Have TSH rechecked  Interventions: 1:1 collaboration with Carollee Herter, Alferd Apa, DO regarding development and update of comprehensive plan of care as evidenced by provider attestation and co-signature Inter-disciplinary care team collaboration (see longitudinal plan of care) Comprehensive medication review performed; medication list updated in electronic medical record  Hypertension: Controlled; Goal blood pressure < 140/90 Current treatment:  Lisinopril 53m daily  Estimated adherence for 2022 was 87%. Looks like Rx was sent in for 90 days supply but has only been getting 30 days per fill.  Current home readings: not checking at home No current exercise but is active in her home; per daughter, "she rarely sits down" Denies hypotensive/hypertensive symptoms Interventions:  Consulted with patient's pharmacy to fill lisinopril for 90 day supply going forward Continue to take lisinopril at current dose Encouraged her to stay active - add exercise with KReinaldo Meeker(pedal exerciser) as  able  Hyperlipidemia: Uncontrolled; LDL goal <100 (maintaining LDL at current level is also acceptable given patient's age) No personal history of heart disease but father and her brothers had heart disease.  Patient sees cardio yearly Last LDD = 117 Current treatment: none Medications previously tried: none Patient declines statin Current dietary patterns: patient eats sausage or bacon biscuit every day; she also eats lots of vegetables Interventions:  Education provided on low fat diet. Patient not very open to change. Recommended at least try to change to high fiber cereal or oatmeal every other day.   Osteoporosis: Goal: prevention of fracture Last DEAX 05/2020 DualFemur Total Mean 06/06/2020 Osteoporosis -2.7  Right Forearm Radius 33% 06/06/2020 Osteopenia -1.5  Current treatment: Calcium + d supplement daily Medications previously tried: alendronate - took for several year After last DEXA, Prolia was recommended but patient declined.   Interventions:  Educated on fall prevention Counseled on continuing calcium intake Recommended recheck DEXA in 2023  Hypothyroidism: Goal: TSH in therapeutic range Last TSH 09/09/2020 was WNL Current  treatment:  Levothyroxine 12mg daily Interventions:  Reviewed refill history - patient has filled on time in 2022 (estimated adherence of 97%) Ordered TSH for future (next 1 to 2 months)  Patient Goals/Self-Care Activities Over the next 90 days, patient will:  take medications as prescribed,  check blood pressure once weekly, document, and provide at future appointments, and  engage in dietary modifications by limiting intake of high fat foods like bacon and sausage (try high fiber cereal of oatmeal 2 or 3 days per week instead)  Follow Up Plan: Telephone follow up appointment with care management team member scheduled for:  3 to 4 months        Medication Assistance: None required.  Patient affirms current coverage meets  needs.  Patient's preferred pharmacy is:  [address]Phone: [phone]: 8[place]Phone: [phone]: [phone]  Follow Up:  Patient agrees to Care Plan and Follow-up.  Plan: Telephone follow up appointment with care management team member scheduled for:  3 to 4 months  TCherre Robins PharmD Clinical Pharmacist L[facility]ginML[facility]anes[facility]air[facility]

## 2021-12-08 NOTE — Patient Instructions (Signed)
Mrs. Biddle It was a pleasure speaking with you today.  I have attached a summary of our visit today and information about your health goals.   Patient Goals/Self-Care Activities Over the next 90 days, patient will:  take medications as prescribed,  check blood pressure once weekly, document, and provide at future appointments, and  engage in dietary modifications by limiting intake of high fat foods like bacon and sausage (try high fiber cereal of oatmeal 2 or 3 days per week instead)  If you have any questions or concerns, please feel free to contact me either at the phone number below or with a MyChart message.   Keep up the good work!  Henrene Pastor, PharmD Clinical Pharmacist [facility] [phone] (direct line)  [phone] (main office number)  Hypertension: Controlled; Goal blood pressure < 140/90 BP Readings from Last 3 Encounters:  11/23/21 132/82  12/08/20 (!) 160/65  09/09/20 139/64   Current treatment:  Lisinopril 20mg  daily  Interventions:  Consulted with patient's pharmacy to fill lisinopril for 90 day supply going forward Continue to take lisinopril at current dose Encouraged her to stay active - add exercise with (pedal exerciser) as able  Hyperlipidemia: Uncontrolled; LDL goal <100 (maintaining LDL at current level is also acceptable given patient's age) Lab Results  Component Value Date   CHOL 221 (H) 11/23/2021   HDL 91.00 11/23/2021   LDLCALC 117 (H) 11/23/2021   LDLDIRECT 157.4 01/29/2011   TRIG 63.0 11/23/2021   CHOLHDL 2 11/23/2021   Current treatment: none Interventions:  Education provided on low fat diet. Recommended at least try to change to high fiber cereal or oatmeal every other day.   Osteoporosis: Goal: prevention of fracture Current treatment: Calcium + d supplement daily  Interventions:  Educated on fall prevention Counseled on continuing calcium intake Recommended recheck DEXA in 2023  (after July 2023)  Hypothyroidism: Current treatment:  Levothyroxine August 2023 daily Interventions:  Continue to take levothyroxine daily Ordered TSH for future (next 1 to 2 months)   Follow Up Plan: Telephone follow up appointment with care management team member scheduled for:  3 to 4 months   Patient verbalizes understanding of instructions and care plan provided today and agrees to view in MyChart. Active MyChart status confirmed with patient.

## 2021-12-12 DIAGNOSIS — I10 Essential (primary) hypertension: Secondary | ICD-10-CM | POA: Diagnosis not present

## 2021-12-12 DIAGNOSIS — E039 Hypothyroidism, unspecified: Secondary | ICD-10-CM

## 2021-12-12 DIAGNOSIS — E785 Hyperlipidemia, unspecified: Secondary | ICD-10-CM | POA: Diagnosis not present

## 2022-01-15 ENCOUNTER — Ambulatory Visit: Payer: Medicare Other | Admitting: Podiatry

## 2022-01-15 ENCOUNTER — Encounter: Payer: Self-pay | Admitting: Podiatry

## 2022-01-15 ENCOUNTER — Other Ambulatory Visit: Payer: Self-pay

## 2022-01-15 DIAGNOSIS — M79675 Pain in left toe(s): Secondary | ICD-10-CM

## 2022-01-15 DIAGNOSIS — B351 Tinea unguium: Secondary | ICD-10-CM | POA: Diagnosis not present

## 2022-01-15 DIAGNOSIS — M79674 Pain in right toe(s): Secondary | ICD-10-CM | POA: Diagnosis not present

## 2022-01-21 NOTE — Progress Notes (Signed)
?  Subjective:  ?Patient ID: [name] [name], female    DOB: 15-Feb-1927,  MRN: [id number] ? ?[name] [name] presents to clinic today for painful thick toenails that are difficult to trim. Pain interferes with ambulation. Aggravating factors include wearing enclosed shoe gear. Pain is relieved with periodic professional debridement. ? ?Patient is accompanied by her daughter on today's visit. Patient declines callus debridement on today's visit stating it was sore after treatment.  ? ?New problem(s): None.  ? ?PCP is Zola Button, Grayling Congress, DO , and last visit was November 23, 2021. ? ?No Known Allergies ? ?Review of Systems: Negative except as noted in the HPI. ? ?Objective: ?Constitutional [name] [name] is a pleasant 88 y.o. Caucasian female, WD, WN in NAD. AAO x 3.   ?Vascular CFT immediate b/l LE. Palpable DP/PT pulses b/l LE. Digital hair sparse b/l. Skin temperature gradient WNL b/l. No pain with calf compression b/l. No edema noted b/l. No cyanosis or clubbing noted b/l LE.  ?Neurologic Normal speech. Oriented to person, place, and time. Protective sensation intact 5/5 intact bilaterally with 10g monofilament b/l.  ?Dermatologic Pedal integument with normal turgor, texture and tone b/l LE. No open wounds b/l. No interdigital macerations b/l. Toenails 1-5 b/l elongated, thickened, discolored with subungual debris. +Tenderness with dorsal palpation of nailplates. Hyperkeratotic lesion(s) noted submet head 1 b/l.  ?Orthopedic: Muscle strength 5/5 to all lower extremity muscle groups bilaterally. HAV with bunion deformity noted b/l LE. Utilizes rollator for ambulation assistance.  ?Assessment/Plan: ?1. Pain due to onychomycosis of toenails of both feet   ?  ?-Patient declines callus pairing on today's visit.. ?-Patient to continue soft, supportive shoe gear daily. ?-Mycotic toenails 1-5 bilaterally were debrided in length and girth with sterile nail nippers and dremel without incident. ?-Patient/POA to call should there  be question/concern in the interim.  ? ?Return in about 3 months (around 04/17/2022). ? ?[name], DPM  ?

## 2022-01-29 DIAGNOSIS — H6123 Impacted cerumen, bilateral: Secondary | ICD-10-CM | POA: Diagnosis not present

## 2022-02-02 ENCOUNTER — Other Ambulatory Visit: Payer: Self-pay | Admitting: Family Medicine

## 2022-02-14 DIAGNOSIS — H353134 Nonexudative age-related macular degeneration, bilateral, advanced atrophic with subfoveal involvement: Secondary | ICD-10-CM | POA: Diagnosis not present

## 2022-02-14 DIAGNOSIS — H43811 Vitreous degeneration, right eye: Secondary | ICD-10-CM | POA: Diagnosis not present

## 2022-02-28 ENCOUNTER — Other Ambulatory Visit: Payer: Self-pay | Admitting: Family Medicine

## 2022-02-28 DIAGNOSIS — I10 Essential (primary) hypertension: Secondary | ICD-10-CM

## 2022-03-16 ENCOUNTER — Ambulatory Visit (INDEPENDENT_AMBULATORY_CARE_PROVIDER_SITE_OTHER): Payer: Medicare Other | Admitting: Pharmacist

## 2022-03-16 DIAGNOSIS — I10 Essential (primary) hypertension: Secondary | ICD-10-CM

## 2022-03-16 DIAGNOSIS — R413 Other amnesia: Secondary | ICD-10-CM

## 2022-03-16 DIAGNOSIS — F419 Anxiety disorder, unspecified: Secondary | ICD-10-CM

## 2022-03-16 DIAGNOSIS — E039 Hypothyroidism, unspecified: Secondary | ICD-10-CM

## 2022-03-16 NOTE — Chronic Care Management (AMB) (Signed)
? ? ?Chronic Care Management ?Pharmacy Note ? ?03/16/2022 ?Name:  [name] [name] MRN:  [id number] DOB:  Dec 25, 1926 ? ?Summary: ?Spoke to patient's daughter Santiago Glad. She reports that patient is taking medications regularly. Patient has her own system and declined to allow daughter to help or use medication containers. Patient has dry macular degeneration and eye sight is worsening.  ?Daughters are concerned about patients memory, stress, mood changes. Patient refuses help and daughter states she can be very stubborn regarding accepting help both for herself and her husband. She has allowed in home care for 3 hours in morning and 3 hours in evening but Mrs. Husby often is unhappy about help provided. Daughter report sometimes Mrs. Hatton is awaking in the night and will wake Mr. Mandell to discuss her complaints about care givers. Per daughter Mrs. Mines does not sleep well. She has taken sertraline in past for anxiety / stress but patient stopped on her own because it caused leg cramps.  ?Lastly daughter states patient c/o fatigue often. She has hypothyroidism and is taking levothyroxine 145mcg daily. Last TSH checked 2021.  ? ?Plan:  ?Suggested referral to LCSW regarding stress management and caregiver stress. Daughter states she would like to discuss with her sister and will get back to [place] if she would like referral.  ?Patient will see PCP 03/22/2022 for video visit. Will consult with PCP about addressing the following: ?Consider checking B12 and TSH ?Consider pharmacotherapy like trazodone $RemoveBefore'25mg'KOwdYWbIsBrWJ$  or mirtazapine 7.$RemoveBeforeDE'5mg'JKEUCbuDySCKyhS$  at bedtime for sleep and mood.  ? ? ?Subjective: ?[name] [name] is an 88 y.o. year old female who is a primary patient of Ann Held, DO.  The CCM team was consulted for assistance with disease management and care coordination needs.   ? ?Engaged with patient by telephone for follow up visit in response to provider referral for pharmacy case management and/or care coordination services.  ? ?Consent  to Services:  ?The patient was given information about Chronic Care Management services, agreed to services, and gave verbal consent prior to initiation of services.  Please see initial visit note for detailed documentation.  ? ?Patient Care Team: ?Carollee Herter, Alferd Apa, DO as PCP - General ?Druscilla Brownie, MD as Referring Physician (Dermatology) ?Glennie Isle, PA-C (Physician Assistant) ?Cherre Robins, RPH-CPP (Pharmacist) ? ?Recent office visits: ?11/23/2021-Yvonne R. Carollee Herter, DO (PCP) Annual exam. Ambulatory referral to Neuropsychology. Pneumococcal conjugate vaccine given. Follow up in 3 months. ? ?Recent consult visits: ?01/29/2022 - Otolaryngology ([place], [place]) F/U ceruman removal. Wear hearing aids. F/U 1 year.  ? ?Hospital visits: ?None in previous 6 months ? ?Objective: ? ?Lab Results  ?Component Value Date  ? CREATININE 0.79 11/23/2021  ? CREATININE 0.82 09/09/2020  ? CREATININE 0.82 01/09/2019  ? ? ?No results found for: HGBA1C ?Last diabetic Eye exam: No results found for: HMDIABEYEEXA  ?Last diabetic Foot exam: No results found for: HMDIABFOOTEX  ? ?   ?Component Value Date/Time  ? CHOL 221 (H) 11/23/2021 1011  ? TRIG 63.0 11/23/2021 1011  ? HDL 91.00 11/23/2021 1011  ? CHOLHDL 2 11/23/2021 1011  ? VLDL 12.6 11/23/2021 1011  ? LDLCALC 117 (H) 11/23/2021 1011  ? LDLCALC 118 (H) 09/09/2020 1119  ? LDLDIRECT 157.4 01/29/2011 0959  ? ? ? ?  Latest Ref Rng & Units 11/23/2021  ? 10:11 AM 09/09/2020  ? 11:19 AM 01/09/2019  ? 11:18 AM  ?Hepatic Function  ?Total Protein 6.0 - 8.3 g/dL 6.5   6.8   7.0    ?  Albumin 3.5 - 5.2 g/dL 3.9    4.3    ?AST 0 - 37 U/L 24   33   26    ?ALT 0 - 35 U/L 23   40   28    ?Alk Phosphatase 39 - 117 U/L 77    85    ?Total Bilirubin 0.2 - 1.2 mg/dL 0.8   0.7   0.6    ? ? ?Lab Results  ?Component Value Date/Time  ? TSH 3.11 09/09/2020 11:19 AM  ? TSH 4.08 05/07/2019 10:34 AM  ? FREET4 1.06 09/05/2015 06:53 PM  ? FREET4 1.2 09/20/2008 03:21 PM  ? ? ? ?  Latest Ref Rng &  Units 11/23/2021  ? 10:11 AM 09/09/2020  ? 11:19 AM 01/09/2019  ? 11:18 AM  ?CBC  ?WBC 4.0 - 10.5 K/uL 6.1   7.4   7.7    ?Hemoglobin 12.0 - 15.0 g/dL 14.2   14.6   15.3    ?Hematocrit 36.0 - 46.0 % 43.6   43.7   45.5    ?Platelets 150.0 - 400.0 K/uL 149.0   145   168.0    ? ? ?Lab Results  ?Component Value Date/Time  ? VD25OH 34 11/02/2010 08:23 PM  ? ? ?Clinical ASCVD: No  ?The ASCVD Risk score (Arnett DK, et al., 2019) failed to calculate for the following reasons: ?  The 2019 ASCVD risk score is only valid for ages 18 to 51   ? ? ? ?Social History  ? ?Tobacco Use  ?Smoking Status Never  ?Smokeless Tobacco Never  ? ?BP Readings from Last 3 Encounters:  ?11/23/21 132/82  ?12/08/20 (!) 160/65  ?09/09/20 139/64  ? ?Pulse Readings from Last 3 Encounters:  ?11/23/21 67  ?12/08/20 73  ?09/09/20 71  ? ?Wt Readings from Last 3 Encounters:  ?11/23/21 110 lb 3.2 oz (50 kg)  ?12/08/20 115 lb (52.2 kg)  ?09/09/20 115 lb 3.2 oz (52.3 kg)  ? ? ?Assessment: Review of patient past medical history, allergies, medications, health status, including review of consultants reports, laboratory and other test data, was performed as part of comprehensive evaluation and provision of chronic care management services.  ? ?SDOH:  (Social Determinants of Health) assessments and interventions performed:  ?SDOH Interventions   ? ?Flowsheet Row Most Recent Value  ?SDOH Interventions   ?Food Insecurity Interventions Intervention Not Indicated  ?Financial Strain Interventions Intervention Not Indicated  ?Physical Activity Interventions Other (Comments)  [walks a little outside most days]  ?Stress Interventions Patient Refused  ? ?  ? ? ?[place] ? ?No Known Allergies ? ?Medications Reviewed Today   ? ? Reviewed by Cherre Robins, RPH-CPP (Pharmacist) on 03/16/22 at [place] List Status: <None>  ? ?Medication Order Taking? Sig Documenting Provider Last Dose Status Informant  ?calcium carbonate (OSCAL) 1500 (600 Ca) MG TABS tablet [id number] Yes  Take 1 tablet by mouth daily. [provider] Taking Active Self  ?levothyroxine (SYNTHROID) 100 MCG tablet [id number] Yes TAKE ONE TABLET (100 MCG TOTAL) BY MOUTHDAILY Roma Schanz R, DO Taking Active   ?lisinopril (ZESTRIL) 20 MG tablet [id number] Yes TAKE ONE TABLET BY MOUTH ONCE DAILY Ann Held, DO Taking Active   ?Multiple Vitamins-Minerals (ICAPS AREDS 2 PO) [id number] Yes Take 1 tablet by mouth in the morning and at bedtime. [provider] Taking Active Self  ?nitroGLYCERIN (NITROSTAT) 0.4 MG SL tablet [id number] No Place 1 tablet (0.4 mg total) under the tongue every 5 (five)  minutes x 3 doses as needed for chest pain.  ?Patient not taking: Reported on 12/08/2021  ? Almyra Deforest, [place] Not Taking Active   ?sodium-potassium bicarbonate (ALKA-SELTZER GOLD) TBEF dissolvable tablet [id number] No Take 1 tablet by mouth daily as needed (indigestion).  ?Patient not taking: Reported on 03/16/2022  ? [provider] Not Taking Active Self  ? ?  ?  ? ?  ? ? ?Patient Active Problem List  ? Diagnosis Date Noted  ? Preventative health care 11/23/2021  ? Memory loss 11/23/2021  ? Bruising 09/09/2020  ? Chronic right-sided low back pain without sciatica 06/03/2020  ? Acute mid back pain 06/03/2020  ? Weight decrease 08/04/2018  ? Anxiety 08/04/2018  ? Slow transit constipation 08/04/2018  ? Chest pain 09/05/2015  ? Hyperlipidemia 09/05/2015  ? CKD (chronic kidney disease), stage II-III 09/05/2015  ? Abnormal EKG 09/05/2015  ? HEMATURIA UNSPECIFIED 11/01/2009  ? BACK PAIN 11/01/2009  ? Hypothyroidism 07/11/2008  ? CERUMEN IMPACTION, BILATERAL 07/01/2008  ? DIZZINESS 07/01/2008  ? Essential hypertension 01/15/2008  ? KNEE PAIN, RIGHT 01/15/2008  ? Osteoporosis 01/15/2008  ? TONSILLECTOMY AND ADENOIDECTOMY, HX OF 01/15/2008  ? ? ?Immunization History  ?Administered Date(s) Administered  ? Fluad Quad(high Dose 65+) 08/21/2019, 09/09/2020, 10/17/2021  ? Influenza, High Dose Seasonal PF  08/04/2018  ? PFIZER(Purple Top)SARS-COV-2 Vaccination 12/17/2019, 01/06/2020, 09/30/2020  ? PNEUMOCOCCAL CONJUGATE-20 11/23/2021  ? Pneumococcal Conjugate-13 01/09/2019  ? ? ?Conditions to be addressed/monitored: ?

## 2022-03-16 NOTE — Patient Instructions (Signed)
[name],  ?It was a pleasure speaking with you  ?Below is a summary of your health goals and care plan ? ?If you have any questions or concerns, please feel free to contact me either at the phone number below or with a MyChart message.  ? ?Keep up the good work! ? ?[name] [name], PharmD ?Clinical Pharmacist ?[facility] ?[place] ?[phone] (direct line)  ?[phone] (main office number) ? ?Hypertension: ?Controlled; Goal blood pressure < 140/90 ?BP Readings from Last 3 Encounters:  ?11/23/21 132/82  ?12/08/20 (!) 160/65  ?09/09/20 139/64  ? ?Current treatment:  ?Lisinopril 20mg  daily  ?Interventions:  ?Consulted with patient's pharmacy to fill lisinopril for 90 day supply going forward ?Continue to take lisinopril at current dose ?Encouraged her to stay active - add exercise with [name] [name] (pedal exerciser) as able ? ?Hyperlipidemia: ?Uncontrolled; LDL goal <100 (maintaining LDL at current level is also acceptable given patient's age) ?Lab Results  ?Component Value Date  ? CHOL 221 (H) 11/23/2021  ? HDL 91.00 11/23/2021  ? LDLCALC 117 (H) 11/23/2021  ? LDLDIRECT 157.4 01/29/2011  ? TRIG 63.0 11/23/2021  ? CHOLHDL 2 11/23/2021  ? ?Current treatment: ?none ?Interventions:  ?Education provided on low fat diet. Recommended at least try to change to high fiber cereal or oatmeal every other day.  ? ?Osteoporosis: ?Goal: prevention of fracture ?Current treatment: ?Calcium + d supplement daily  ?Interventions:  ?Educated on fall prevention ?Counseled on continuing calcium intake ?Recommended recheck DEXA in 2023 (after July 2023) ? ?Hypothyroidism: ?Current treatment:  ?Levothyroxine 12mcg daily ?Interventions:  ?Continue to take levothyroxine daily ?Check B12 and TSH  ? ? ?Patient Goals/Self-Care Activities ?Over the next 90 days, patient will:  ?take medications as prescribed,  ?check blood pressure once weekly, document, and provide at future appointments, and  ?engage in dietary modifications  by limiting intake of high fat foods like bacon and sausage (try high fiber cereal of oatmeal 2 or 3 days per week instead) ? ?Patient Goals/Self-Care Activities ?Over the next 90 days, patient will:  ?take medications as prescribed,  ?check blood pressure once weekly, document, and provide at future appointments, and  ?engage in dietary modifications by limiting intake of high fat foods like bacon and sausage (try high fiber cereal of oatmeal 2 or 3 days per week instead) ? ?Follow Up Plan: Telephone follow up appointment with care management team member scheduled for:  3 to 4 months  ? ?Patient verbalizes understanding of instructions and care plan provided today and agrees to view in [place]. Active MyChart status confirmed with patient.    ?

## 2022-03-17 ENCOUNTER — Other Ambulatory Visit: Payer: Self-pay | Admitting: Family Medicine

## 2022-03-17 DIAGNOSIS — I10 Essential (primary) hypertension: Secondary | ICD-10-CM

## 2022-03-22 ENCOUNTER — Other Ambulatory Visit: Payer: Self-pay | Admitting: Family Medicine

## 2022-03-22 ENCOUNTER — Encounter: Payer: Self-pay | Admitting: Family Medicine

## 2022-03-22 ENCOUNTER — Telehealth (INDEPENDENT_AMBULATORY_CARE_PROVIDER_SITE_OTHER): Payer: Medicare Other | Admitting: Family Medicine

## 2022-03-22 DIAGNOSIS — R413 Other amnesia: Secondary | ICD-10-CM | POA: Diagnosis not present

## 2022-03-22 DIAGNOSIS — R739 Hyperglycemia, unspecified: Secondary | ICD-10-CM

## 2022-03-22 DIAGNOSIS — E785 Hyperlipidemia, unspecified: Secondary | ICD-10-CM | POA: Diagnosis not present

## 2022-03-22 DIAGNOSIS — I10 Essential (primary) hypertension: Secondary | ICD-10-CM | POA: Diagnosis not present

## 2022-03-22 DIAGNOSIS — E039 Hypothyroidism, unspecified: Secondary | ICD-10-CM

## 2022-03-22 MED ORDER — LEVOTHYROXINE SODIUM 100 MCG PO TABS: 100.0000 ug | ORAL_TABLET | Freq: Every day | ORAL | 2 refills | Status: DC

## 2022-03-22 NOTE — Assessment & Plan Note (Signed)
Pt and fam did not want to address that today ?

## 2022-03-22 NOTE — Progress Notes (Signed)
? ? ?MyChart Video Visit ? ? ? ?Virtual Visit via Video Note  ? ?This visit type was conducted due to national recommendations for restrictions regarding the COVID-19 Pandemic (e.g. social distancing) in an effort to limit this patient's exposure and mitigate transmission in our community. This patient is at least at moderate risk for complications without adequate follow up. This format is felt to be most appropriate for this patient at this time. Physical exam was limited by quality of the video and audio technology used for the visit. Luster Landsberg was able to get the patient set up on a video visit. ? ?Patient location: Home Patient and provider in visit ?Provider location: Office ? ?I discussed the limitations of evaluation and management by telemedicine and the availability of in person appointments. The patient expressed understanding and agreed to proceed. ? ?Visit Date: 03/22/2022 ? ?Today's healthcare provider: Donato Schultz, DO  ? ? ? ?Subjective:  ? ? Patient ID: [name] [name], female    DOB: 1927-10-03, 88 y.o.   MRN: [id number] ? ?Chief Complaint  ?Patient presents with  ? Hypertension  ? Memory Loss  ? Follow-up  ? ? ?HPI ?Patient is in today for a video visit.  ? ?She complains of constipation. She finds her constipation disrupts her sleep at night. She does not drink enough water throughout the day and is planning on drinking more to manage her symptoms.  ?She reports feeling more fatigued and sleepy during the day. She typically takes naps throughout the day. She thinks she is receiving enough rest at night. Her daughters reports she wakes up multiple times at night. Her daughters are planning to try melatonin supplements to help her symptoms.  ?Her daughter reports she has memory issues. Her daughter reports she has only forgotten one person, otherwise she does not typically forget people. Patient does not want to discuss her memory loss. She does not regularly check her blood pressure.  Her husbands home health nurse typically checks her blood pressure. She is coming in tomorrow and she is planning on checking it then.  ?She is due for thyroid and chortosterol labs and is willing to come into labs and complete them.  ?She is UTD on medication refills.  ? ? ?Past Medical History:  ?Diagnosis Date  ? Arthritis   ? "right thumb" (09/05/2015)  ? Hyperlipidemia   ? Hypertension   ? Hypothyroidism   ? Osteoporosis   ? ? ?Past Surgical History:  ?Procedure Laterality Date  ? CATARACT EXTRACTION W/ INTRAOCULAR LENS  IMPLANT, BILATERAL Bilateral 2010  ? CLOSED REDUCTION SHOULDER DISLOCATION Right   ? OVARIAN CYST SURGERY  1949  ? TONSILLECTOMY    ? ? ?Family History  ?Problem Relation Age of Onset  ? Cancer Mother   ?     bladder  ? Cancer Father   ?     unknown  ? Ovarian cancer Sister   ? Heart disease Brother   ?     All 5 brothers have had heart issues starting in their 67s  ? Breast cancer Daughter   ? ? ?Social History  ? ?Socioeconomic History  ? Marital status: Married  ?  Spouse name: Not on file  ? Number of children: Not on file  ? Years of education: Not on file  ? Highest education level: Not on file  ?Occupational History  ? Not on file  ?Tobacco Use  ? Smoking status: Never  ? Smokeless tobacco: Never  ?Substance and  Sexual Activity  ? Alcohol use: No  ? Drug use: No  ? Sexual activity: Yes  ?  Partners: Male  ?Other Topics Concern  ? Not on file  ?Social History Narrative  ? Lives with her husband in a house   ? 2 children close by to help  ? ?Social Determinants of Health  ? ?Financial Resource Strain: Low Risk   ? Difficulty of Paying Living Expenses: Not hard at all  ?Food Insecurity: No Food Insecurity  ? Worried About Programme researcher, broadcasting/film/video in the Last Year: Never true  ? Ran Out of Food in the Last Year: Never true  ?Transportation Needs: No Transportation Needs  ? Lack of Transportation (Medical): No  ? Lack of Transportation (Non-Medical): No  ?Physical Activity: Insufficiently Active   ? Days of Exercise per Week: 3 days  ? Minutes of Exercise per Session: 10 min  ?Stress: Stress Concern Present  ? Feeling of Stress : Rather much  ?Social Connections: Not on file  ?Intimate Partner Violence: Not on file  ? ? ?Outpatient Medications Prior to Visit  ?Medication Sig Dispense Refill  ? lisinopril (ZESTRIL) 20 MG tablet TAKE ONE TABLET BY MOUTH ONCE DAILY 30 tablet 0  ? calcium carbonate (OSCAL) 1500 (600 Ca) MG TABS tablet Take 1 tablet by mouth daily.    ? Multiple Vitamins-Minerals (ICAPS AREDS 2 PO) Take 1 tablet by mouth in the morning and at bedtime.    ? levothyroxine (SYNTHROID) 100 MCG tablet TAKE ONE TABLET (100 MCG TOTAL) BY MOUTHDAILY 30 tablet 0  ? nitroGLYCERIN (NITROSTAT) 0.4 MG SL tablet Place 1 tablet (0.4 mg total) under the tongue every 5 (five) minutes x 3 doses as needed for chest pain. (Patient not taking: Reported on 12/08/2021) 25 tablet 3  ? sodium-potassium bicarbonate (ALKA-SELTZER GOLD) TBEF dissolvable tablet Take 1 tablet by mouth daily as needed (indigestion). (Patient not taking: Reported on 03/16/2022)    ? ?No facility-administered medications prior to visit.  ? ? ?No Known Allergies ? ?Review of Systems  ?Constitutional:  Negative for fever and malaise/fatigue.  ?HENT:  Negative for congestion.   ?Eyes:  Negative for blurred vision.  ?Respiratory:  Negative for shortness of breath.   ?Cardiovascular:  Negative for chest pain, palpitations and leg swelling.  ?Gastrointestinal:  Negative for abdominal pain, blood in stool and nausea.  ?Genitourinary:  Negative for dysuria and frequency.  ?Musculoskeletal:  Negative for falls.  ?Skin:  Negative for rash.  ?Neurological:  Negative for dizziness, loss of consciousness and headaches.  ?Endo/Heme/Allergies:  Negative for environmental allergies.  ?Psychiatric/Behavioral:  Negative for depression. The patient is not nervous/anxious.   ? ?   ?Objective:  ?  ?Physical Exam ?Vitals and nursing note reviewed.  ?Constitutional:   ?    Appearance: She is well-developed.  ?Eyes:  ?   Conjunctiva/sclera: Conjunctivae normal.  ?Neck:  ?   Thyroid: No thyromegaly.  ?   Vascular: No JVD.  ?Neurological:  ?   Mental Status: She is alert. She is disoriented.  ?Psychiatric:     ?   Mood and Affect: Mood normal.  ? ?There were no vitals taken for this visit. ?Wt Readings from Last 3 Encounters:  ?11/23/21 110 lb 3.2 oz (50 kg)  ?12/08/20 115 lb (52.2 kg)  ?09/09/20 115 lb 3.2 oz (52.3 kg)  ? ? ?Diabetic Foot Exam - Simple   ?No data filed ?  ? ?Lab Results  ?Component Value Date  ? WBC 6.1 11/23/2021  ?  HGB 14.2 11/23/2021  ? HCT 43.6 11/23/2021  ? PLT 149.0 (L) 11/23/2021  ? GLUCOSE 82 11/23/2021  ? CHOL 221 (H) 11/23/2021  ? TRIG 63.0 11/23/2021  ? HDL 91.00 11/23/2021  ? LDLDIRECT 157.4 01/29/2011  ? LDLCALC 117 (H) 11/23/2021  ? ALT 23 11/23/2021  ? AST 24 11/23/2021  ? NA 141 11/23/2021  ? K 4.9 11/23/2021  ? CL 105 11/23/2021  ? CREATININE 0.79 11/23/2021  ? BUN 20 11/23/2021  ? CO2 32 11/23/2021  ? TSH 3.11 09/09/2020  ? INR 0.96 09/05/2015  ? ? ?Lab Results  ?Component Value Date  ? TSH 3.11 09/09/2020  ? ?Lab Results  ?Component Value Date  ? WBC 6.1 11/23/2021  ? HGB 14.2 11/23/2021  ? HCT 43.6 11/23/2021  ? MCV 94.6 11/23/2021  ? PLT 149.0 (L) 11/23/2021  ? ?Lab Results  ?Component Value Date  ? NA 141 11/23/2021  ? K 4.9 11/23/2021  ? CO2 32 11/23/2021  ? GLUCOSE 82 11/23/2021  ? BUN 20 11/23/2021  ? CREATININE 0.79 11/23/2021  ? BILITOT 0.8 11/23/2021  ? ALKPHOS 77 11/23/2021  ? AST 24 11/23/2021  ? ALT 23 11/23/2021  ? PROT 6.5 11/23/2021  ? ALBUMIN 3.9 11/23/2021  ? CALCIUM 9.5 11/23/2021  ? ANIONGAP 9 09/06/2015  ? GFR 64.66 11/23/2021  ? ?Lab Results  ?Component Value Date  ? CHOL 221 (H) 11/23/2021  ? ?Lab Results  ?Component Value Date  ? HDL 91.00 11/23/2021  ? ?Lab Results  ?Component Value Date  ? LDLCALC 117 (H) 11/23/2021  ? ?Lab Results  ?Component Value Date  ? TRIG 63.0 11/23/2021  ? ?Lab Results  ?Component Value Date  ?  CHOLHDL 2 11/23/2021  ? ?No results found for: HGBA1C ? ?   ?Assessment & Plan:  ? ?Problem List Items Addressed This Visit   ? ?  ? Unprioritized  ? Memory loss  ?  Pt and fam did not want to address that

## 2022-03-22 NOTE — Assessment & Plan Note (Signed)
Well controlled, no changes to meds. Encouraged heart healthy diet such as the DASH diet and exercise as tolerated.  ?HH will check bp tomorrow and daughter will send it to [place]  ?

## 2022-03-22 NOTE — Assessment & Plan Note (Signed)
Check labs 

## 2022-03-22 NOTE — Assessment & Plan Note (Signed)
Encourage heart healthy diet such as MIND or DASH diet, increase exercise, avoid trans fats, simple carbohydrates and processed foods, consider a krill or fish or flaxseed oil cap daily.  °

## 2022-03-23 ENCOUNTER — Encounter: Payer: Self-pay | Admitting: Family Medicine

## 2022-03-27 ENCOUNTER — Other Ambulatory Visit: Payer: Medicare Other

## 2022-03-27 ENCOUNTER — Ambulatory Visit (INDEPENDENT_AMBULATORY_CARE_PROVIDER_SITE_OTHER): Payer: Medicare Other | Admitting: Podiatry

## 2022-03-27 ENCOUNTER — Encounter: Payer: Self-pay | Admitting: Podiatry

## 2022-03-27 DIAGNOSIS — M79671 Pain in right foot: Secondary | ICD-10-CM | POA: Diagnosis not present

## 2022-03-27 DIAGNOSIS — L84 Corns and callosities: Secondary | ICD-10-CM

## 2022-03-27 DIAGNOSIS — M79674 Pain in right toe(s): Secondary | ICD-10-CM

## 2022-03-27 DIAGNOSIS — M79675 Pain in left toe(s): Secondary | ICD-10-CM | POA: Diagnosis not present

## 2022-03-27 DIAGNOSIS — B351 Tinea unguium: Secondary | ICD-10-CM | POA: Diagnosis not present

## 2022-04-03 NOTE — Progress Notes (Signed)
  Subjective:  Patient ID: [name] [name], female    DOB: 1927/02/13,  MRN: [id number]  [name] [name] presents to clinic today for painful thick toenails that are difficult to trim. Pain interferes with ambulation. Aggravating factors include wearing enclosed shoe gear. Pain is relieved with periodic professional debridement.  Patient is accompanied by her daughter on today's visit.  New problem(s): None.   PCP is Zola Button, Grayling Congress, DO , and last visit was Mar 22, 2022.  No Known Allergies  Review of Systems: Negative except as noted in the HPI.  Objective:  Constitutional [name] [name] is a pleasant 88 y.o. Caucasian female, WD, WN in NAD. AAO x 3.   Vascular CFT immediate b/l LE. Palpable DP/PT pulses b/l LE. Digital hair sparse b/l. Skin temperature gradient WNL b/l. No pain with calf compression b/l. No edema noted b/l. No cyanosis or clubbing noted b/l LE.  Neurologic Normal speech. Oriented to person, place, and time. Protective sensation intact 5/5 intact bilaterally with 10g monofilament b/l.  Dermatologic Pedal integument with normal turgor, texture and tone b/l LE. No open wounds b/l. No interdigital macerations b/l. Toenails 1-5 b/l elongated, thickened, discolored with subungual debris. +Tenderness with dorsal palpation of nailplates. Preulcerative lesion noted submet head 1 right foot. There is visible subdermal hemorrhage. There is no surrounding erythema, no edema, no drainage, no odor, no fluctuance.   Orthopedic: Muscle strength 5/5 to all lower extremity muscle groups bilaterally. HAV with bunion deformity noted b/l LE. Utilizes rollator for ambulation assistance.   Assessment/Plan: 1. Pain due to onychomycosis of toenails of both feet   2. Pre-ulcerative calluses   3. Pain in right foot      -Consent given for treatment as described below: -Examined patient. -Medicare ABN signed. Patient consents for services of paring of corn(s)/callus(es)/porokeratos(es) today.  Copy in patient chart. -Toenails 1-5 b/l were debrided in length and girth with sterile nail nippers and dremel without iatrogenic bleeding.  -Preulcerative lesion pared submet head 1 right foot. Total number pared=1. -Patient/POA to call should there be question/concern in the interim.   Return in about 9 weeks (around 05/29/2022).  [name], DPM

## 2022-04-04 ENCOUNTER — Other Ambulatory Visit (INDEPENDENT_AMBULATORY_CARE_PROVIDER_SITE_OTHER): Payer: Medicare Other

## 2022-04-04 DIAGNOSIS — E039 Hypothyroidism, unspecified: Secondary | ICD-10-CM

## 2022-04-04 DIAGNOSIS — E785 Hyperlipidemia, unspecified: Secondary | ICD-10-CM | POA: Diagnosis not present

## 2022-04-04 DIAGNOSIS — R739 Hyperglycemia, unspecified: Secondary | ICD-10-CM | POA: Diagnosis not present

## 2022-04-04 DIAGNOSIS — R413 Other amnesia: Secondary | ICD-10-CM | POA: Diagnosis not present

## 2022-04-04 LAB — COMPREHENSIVE METABOLIC PANEL
ALT: 29 U/L (ref 0–35)
AST: 25 U/L (ref 0–37)
Albumin: 4 g/dL (ref 3.5–5.2)
Alkaline Phosphatase: 82 U/L (ref 39–117)
BUN: 25 mg/dL — ABNORMAL HIGH (ref 6–23)
CO2: 30 mEq/L (ref 19–32)
Calcium: 9.5 mg/dL (ref 8.4–10.5)
Chloride: 106 mEq/L (ref 96–112)
Creatinine, Ser: 0.86 mg/dL (ref 0.40–1.20)
GFR: 58.25 mL/min — ABNORMAL LOW (ref >60.00)
Glucose, Bld: 94 mg/dL (ref 70–99)
Potassium: 4.7 mEq/L (ref 3.5–5.1)
Sodium: 142 mEq/L (ref 135–145)
Total Bilirubin: 0.8 mg/dL (ref 0.2–1.2)
Total Protein: 6.5 g/dL (ref 6.0–8.3)

## 2022-04-04 LAB — LIPID PANEL
Cholesterol: 207 mg/dL — ABNORMAL HIGH (ref 0–200)
HDL: 88 mg/dL (ref >39.00)
LDL Cholesterol: 105 mg/dL — ABNORMAL HIGH (ref 0–99)
NonHDL: 118.66
Total CHOL/HDL Ratio: 2
Triglycerides: 67 mg/dL (ref 0.0–149.0)
VLDL: 13.4 mg/dL (ref 0.0–40.0)

## 2022-04-04 LAB — HEMOGLOBIN A1C: Hgb A1c MFr Bld: 5.4 % (ref 4.6–6.5)

## 2022-04-04 LAB — VITAMIN B12: Vitamin B-12: 506 pg/mL (ref 211–911)

## 2022-04-04 LAB — TSH: TSH: 32.25 u[IU]/mL — ABNORMAL HIGH (ref 0.35–5.50)

## 2022-04-11 DIAGNOSIS — E785 Hyperlipidemia, unspecified: Secondary | ICD-10-CM | POA: Diagnosis not present

## 2022-04-11 DIAGNOSIS — M818 Other osteoporosis without current pathological fracture: Secondary | ICD-10-CM

## 2022-04-11 DIAGNOSIS — I10 Essential (primary) hypertension: Secondary | ICD-10-CM | POA: Diagnosis not present

## 2022-04-11 DIAGNOSIS — E1169 Type 2 diabetes mellitus with other specified complication: Secondary | ICD-10-CM

## 2022-04-13 ENCOUNTER — Telehealth: Payer: Self-pay | Admitting: Pharmacist

## 2022-04-13 NOTE — Telephone Encounter (Signed)
Patient / patient's family had not heard lab results from last week yet (04/04/2022)  Reviewed labs with patient's daughter. Labs were good except TSH was very elevated. Discussed medication adherence with daughter. She report that patient might skip dose if she forgets to take 30 minutes prior to breakfast.  Recommended to take medication daily even if it is with / after breakfast.  Explained that absorption of levothyroxine is best on an empty stomach but better to take in fed state then not at all.

## 2022-04-16 ENCOUNTER — Other Ambulatory Visit: Payer: Self-pay | Admitting: Family Medicine

## 2022-04-16 DIAGNOSIS — E039 Hypothyroidism, unspecified: Secondary | ICD-10-CM

## 2022-04-17 NOTE — Telephone Encounter (Signed)
Pt's daughter states she has not heard anything from our office about labs and she is very concerned. She would like a nurse to call her back and go over her thyroid levels and advise if medications need to be increased. Please advise.

## 2022-04-17 NOTE — Telephone Encounter (Signed)
Called patient's daughter about recommendations but daughter was concerned that patient only misses 1 or 2 doses per week and then TSH was so elevated. I will double check with Dr Zola Button about either increasing levothyroxine or checking TSH soon in 4 to 6 weeks.

## 2022-04-18 ENCOUNTER — Ambulatory Visit: Payer: Medicare Other | Admitting: Podiatry

## 2022-04-18 MED ORDER — LEVOTHYROXINE SODIUM 112 MCG PO TABS: 112.0000 ug | ORAL_TABLET | Freq: Every day | ORAL | 2 refills | Status: DC

## 2022-04-18 NOTE — Telephone Encounter (Signed)
Dose of levothyroxine increased to daily. Rx sent to pharmacy [id number] with 2 RF. Made lab appt for follow up 06/05/2022

## 2022-05-16 ENCOUNTER — Other Ambulatory Visit: Payer: Self-pay | Admitting: Family Medicine

## 2022-05-25 ENCOUNTER — Other Ambulatory Visit: Payer: Self-pay | Admitting: Family Medicine

## 2022-05-25 DIAGNOSIS — I10 Essential (primary) hypertension: Secondary | ICD-10-CM

## 2022-05-28 ENCOUNTER — Ambulatory Visit: Payer: Medicare Other | Admitting: Podiatry

## 2022-05-29 ENCOUNTER — Telehealth: Payer: Self-pay | Admitting: Family Medicine

## 2022-05-29 NOTE — Telephone Encounter (Signed)
Elnita Maxwell (daughter) called stating that she was unsure if the labs would be accurate that the pt is scheduled to get done. Elnita Maxwell stated that the levothyroxine the pt was supposed to take was lost and had to be reordered. Pt has only been on this medication 19 days at the time of when her appt is and is wanting to know if this appt needs to be moved.

## 2022-05-30 NOTE — Telephone Encounter (Signed)
Noted  

## 2022-05-30 NOTE — Telephone Encounter (Signed)
Spoke to patient's daughter, appt was moved to 8/14

## 2022-06-05 ENCOUNTER — Encounter: Payer: Self-pay | Admitting: Podiatry

## 2022-06-05 ENCOUNTER — Ambulatory Visit: Payer: Medicare Other | Admitting: Podiatry

## 2022-06-05 ENCOUNTER — Other Ambulatory Visit: Payer: Medicare Other

## 2022-06-05 DIAGNOSIS — M79671 Pain in right foot: Secondary | ICD-10-CM | POA: Diagnosis not present

## 2022-06-05 DIAGNOSIS — M79675 Pain in left toe(s): Secondary | ICD-10-CM

## 2022-06-05 DIAGNOSIS — M79674 Pain in right toe(s): Secondary | ICD-10-CM

## 2022-06-05 DIAGNOSIS — L84 Corns and callosities: Secondary | ICD-10-CM | POA: Diagnosis not present

## 2022-06-05 DIAGNOSIS — B351 Tinea unguium: Secondary | ICD-10-CM

## 2022-06-10 NOTE — Progress Notes (Signed)
  Subjective:  Patient ID: [name] [name], female    DOB: 1927-07-08,  MRN: [id number]  DANNY ZIMNY presents to clinic today for preulcerative lesion(s) b/l lower extremities and painful mycotic toenails that limit ambulation. Painful toenails interfere with ambulation. Aggravating factors include wearing enclosed shoe gear. Pain is relieved with periodic professional debridement. Painful porokeratotic lesions are aggravated when weightbearing with and without shoegear. Pain is relieved with periodic professional debridement.  New problem(s): None.   PCP is Zola Button, Grayling Congress, DO , and last visit was  Mar 22, 2022  No Known Allergies  Review of Systems: Negative except as noted in the HPI.  Objective: Constitutional [name] [name] is a pleasant 88 y.o. Caucasian female, WD, WN in NAD. AAO x 3.   Vascular CFT immediate b/l LE. Palpable DP/PT pulses b/l LE. Digital hair sparse b/l. Skin temperature gradient WNL b/l. No pain with calf compression b/l. No edema noted b/l. No cyanosis or clubbing noted b/l LE.  Neurologic Normal speech. Oriented to person, place, and time. Protective sensation intact 5/5 intact bilaterally with 10g monofilament b/l.  Dermatologic Pedal integument with normal turgor, texture and tone b/l LE. No open wounds b/l. No interdigital macerations b/l. Toenails 1-5 b/l elongated, thickened, discolored with subungual debris. +Tenderness with dorsal palpation of nailplates. Preulcerative lesion noted submet head 1 bilaterally. There is visible subdermal hemorrhage. There is no surrounding erythema, no edema, no drainage, no odor, no fluctuance.   Orthopedic: Muscle strength 5/5 to all lower extremity muscle groups bilaterally. HAV with bunion deformity noted b/l LE. Utilizes rollator for ambulation assistance.       Latest Ref Rng & Units 04/04/2022   10:01 AM  Hemoglobin A1C  Hemoglobin-A1c 4.6 - 6.5 % 5.4    Assessment/Plan: 1. Pain due to onychomycosis of toenails of  both feet   2. Pre-ulcerative calluses   3. Pain in right foot      -Patient was evaluated and treated. All patient's and/or POA's questions/concerns answered on today's visit. -Patient to continue soft, supportive shoe gear daily. -Mycotic toenails 1-5 bilaterally were debrided in length and girth with sterile nail nippers and dremel without incident. -Preulcerative lesion pared submet head 1 b/l. Total number pared=2. Pinpoint bleeding right foot addressed with Lumicain Hemostatic Solution. Triple antibiotic ointment and band-aid applied. Apply Neosporin once daily for one week. Both shoe insoles offloaded with felt aperture pads. Daughter instructed to change pads once monthly. -Patient/POA to call should there be question/concern in the interim.   Return in about 9 weeks (around 08/07/2022).  [name], DPM

## 2022-06-15 ENCOUNTER — Ambulatory Visit (INDEPENDENT_AMBULATORY_CARE_PROVIDER_SITE_OTHER): Payer: Medicare Other | Admitting: Family

## 2022-06-15 ENCOUNTER — Ambulatory Visit (HOSPITAL_COMMUNITY)
Admission: RE | Admit: 2022-06-15 | Discharge: 2022-06-15 | Disposition: A | Discharge status: Home / Self Care | Payer: Medicare Other | Source: Ambulatory Visit | Attending: Family | Admitting: Family

## 2022-06-15 ENCOUNTER — Telehealth: Payer: Self-pay

## 2022-06-15 VITALS — BP 120/60 | HR 68 | Temp 98.2°F | Resp 18 | Ht 63.0 in | Wt 111.8 lb

## 2022-06-15 DIAGNOSIS — M25469 Effusion, unspecified knee: Secondary | ICD-10-CM | POA: Diagnosis not present

## 2022-06-15 DIAGNOSIS — R600 Localized edema: Secondary | ICD-10-CM

## 2022-06-15 MED ORDER — DOXYCYCLINE HYCLATE 100 MG PO TABS: 100.0000 mg | ORAL_TABLET | Freq: Two times a day (BID) | ORAL | 0 refills | Status: DC

## 2022-06-15 NOTE — Telephone Encounter (Signed)
[facility] called with doppler results: (-) for DVT, (+) for fluid- as expected.

## 2022-06-15 NOTE — Patient Instructions (Signed)
You will need to go [facility] for a 3:00 pm appointment to get the doppler done.  Assuming she does not have a blood clot, we will send in antibiotics for possible cellulitis and will need to get her knee looked at next week.  If she does have a blood clot, you will be given further instructions on how to manage.

## 2022-06-15 NOTE — Telephone Encounter (Signed)
Pts daughter Stevphen Meuse ([phone]) was called and made aware of the information.

## 2022-06-15 NOTE — Telephone Encounter (Signed)
Per Vernona Rieger: "Perfect! I gave daughter directions for negative. Can you call and make sure daughter knows to start antibiotics, I did referral to Ortho. Thank you!!"

## 2022-06-15 NOTE — Progress Notes (Signed)
[name] [name] is a 88 y.o. female with the following history as recorded in EpicCare:  Patient Active Problem List   Diagnosis Date Noted   Bilateral impacted cerumen 01/29/2022   Preventative health care 11/23/2021   Memory loss 11/23/2021   Bruising 09/09/2020   Chronic right-sided low back pain without sciatica 06/03/2020   Acute mid back pain 06/03/2020   Weight decrease 08/04/2018   Anxiety 08/04/2018   Slow transit constipation 08/04/2018   Chest pain 09/05/2015   Hyperlipidemia 09/05/2015   CKD (chronic kidney disease), stage II-III 09/05/2015   Abnormal EKG 09/05/2015   HEMATURIA UNSPECIFIED 11/01/2009   BACK PAIN 11/01/2009   Hypothyroidism 07/11/2008   CERUMEN IMPACTION, BILATERAL 07/01/2008   DIZZINESS 07/01/2008   Essential hypertension 01/15/2008   KNEE PAIN, RIGHT 01/15/2008   Osteoporosis 01/15/2008   TONSILLECTOMY AND ADENOIDECTOMY, HX OF 01/15/2008    Current Outpatient Medications  Medication Sig Dispense Refill   calcium carbonate (OSCAL) 1500 (600 Ca) MG TABS tablet Take 1 tablet by mouth daily.     levothyroxine (SYNTHROID) 112 MCG tablet Take 1 tablet (112 mcg total) by mouth daily. 30 tablet 2   lisinopril (ZESTRIL) 20 MG tablet TAKE ONE TABLET (20 MG TOTAL) BY MOUTH ONCE DAILY 30 tablet 0   Multiple Vitamins-Minerals (ICAPS AREDS 2 PO) Take 1 tablet by mouth in the morning and at bedtime.     No current facility-administered medications for this visit.    Allergies: Patient has no known allergies.  Past Medical History:  Diagnosis Date   Arthritis    "right thumb" (09/05/2015)   Hyperlipidemia    Hypertension    Hypothyroidism    Osteoporosis     Past Surgical History:  Procedure Laterality Date   CATARACT EXTRACTION W/ INTRAOCULAR LENS  IMPLANT, BILATERAL Bilateral 2010   CLOSED REDUCTION SHOULDER DISLOCATION Right    OVARIAN CYST SURGERY  1949   TONSILLECTOMY      Family History  Problem Relation Age of Onset   Cancer Mother         bladder   Cancer Father        unknown   Ovarian cancer Sister    Heart disease Brother        All 5 brothers have had heart issues starting in their 75s   Breast cancer Daughter     Social History   Tobacco Use   Smoking status: Never   Smokeless tobacco: Never  Substance Use Topics   Alcohol use: No    Subjective:   Accompanied by family member; concerned for localized swelling in right leg- has been present x 1 week; marked swelling noted in right knee; daughter notes that has noticed redness develop in lower foot/ ankle in the past few days;  Home healthcare nurse evaluated leg yesterday and recommended further evaluation; denies any chest pain or shortness of breath;     Objective:  Vitals:   06/15/22 1345  BP: 120/60  Pulse: 68  Resp: 18  Temp: 98.2 F (36.8 C)  TempSrc: Temporal  SpO2: 97%  Weight: 111 lb 12.8 oz (50.7 kg)  Height: 5\' 3"  (1.6 m)    General: Well developed, well nourished, in no acute distress  Skin : Warm and dry.  Head: Normocephalic and atraumatic  Lungs: Respirations unlabored;  Musculoskeletal: No deformities; marked swelling over right knee/ right calf; right calf is tender to palpation; redness noted over ankle/ foot;  Extremities: No edema, cyanosis, clubbing  Vessels: Symmetric bilaterally  Neurologic: Alert and oriented; speech intact; face symmetrical; moves all extremities well; CNII-XII intact without focal deficit   Assessment:  1. Pedal edema   2. Knee swelling     Plan:   Concern for DVT vs cellulitis; patient is seen on a Friday afternoon and able to get STAT venous doppler scheduled for 3 pm; will have to hold labs and knee imaging due to emergent nature of doppler;  Explained to daughter that if her mother does not have a DVT, she is to have her mother start taking oral antibiotics for secondary cellulitis; if she does have DVT, direction will be given from provider who takes on call report; Will also schedule for  orthopedic consult to have right knee evaluated; family requests [facility];    No follow-ups on file.  No orders of the defined types were placed in this encounter.   Requested Prescriptions    No prescriptions requested or ordered in this encounter

## 2022-06-18 ENCOUNTER — Ambulatory Visit: Payer: Medicare Other | Admitting: Family Medicine

## 2022-06-25 ENCOUNTER — Other Ambulatory Visit: Payer: Medicare Other

## 2022-06-25 ENCOUNTER — Ambulatory Visit: Payer: Medicare Other | Admitting: Family Medicine

## 2022-06-25 ENCOUNTER — Ambulatory Visit (HOSPITAL_BASED_OUTPATIENT_CLINIC_OR_DEPARTMENT_OTHER)
Admission: RE | Admit: 2022-06-25 | Discharge: 2022-06-25 | Disposition: A | Discharge status: Home / Self Care | Payer: Medicare Other | Source: Ambulatory Visit | Attending: Family Medicine | Admitting: Family Medicine

## 2022-06-25 ENCOUNTER — Encounter: Payer: Self-pay | Admitting: Family Medicine

## 2022-06-25 VITALS — BP 130/60 | HR 70 | Temp 98.1°F | Resp 18 | Ht 63.0 in | Wt 108.6 lb

## 2022-06-25 DIAGNOSIS — L03115 Cellulitis of right lower limb: Secondary | ICD-10-CM | POA: Diagnosis not present

## 2022-06-25 DIAGNOSIS — M25571 Pain in right ankle and joints of right foot: Secondary | ICD-10-CM | POA: Diagnosis not present

## 2022-06-25 DIAGNOSIS — M7989 Other specified soft tissue disorders: Secondary | ICD-10-CM | POA: Diagnosis not present

## 2022-06-25 DIAGNOSIS — M11261 Other chondrocalcinosis, right knee: Secondary | ICD-10-CM | POA: Diagnosis not present

## 2022-06-25 DIAGNOSIS — G8929 Other chronic pain: Secondary | ICD-10-CM

## 2022-06-25 DIAGNOSIS — M19071 Primary osteoarthritis, right ankle and foot: Secondary | ICD-10-CM | POA: Diagnosis not present

## 2022-06-25 DIAGNOSIS — E039 Hypothyroidism, unspecified: Secondary | ICD-10-CM

## 2022-06-25 DIAGNOSIS — R600 Localized edema: Secondary | ICD-10-CM | POA: Diagnosis not present

## 2022-06-25 DIAGNOSIS — M25561 Pain in right knee: Secondary | ICD-10-CM | POA: Diagnosis not present

## 2022-06-25 DIAGNOSIS — M25461 Effusion, right knee: Secondary | ICD-10-CM | POA: Diagnosis not present

## 2022-06-25 MED ORDER — FUROSEMIDE 20 MG PO TABS: 20.0000 mg | ORAL_TABLET | Freq: Every day | ORAL | 3 refills | Status: DC

## 2022-06-25 MED ORDER — DOXYCYCLINE HYCLATE 100 MG PO TABS: 100.0000 mg | ORAL_TABLET | Freq: Two times a day (BID) | ORAL | 0 refills | Status: DC

## 2022-06-25 NOTE — Assessment & Plan Note (Signed)
Elevate legs Lasix prn edema Compression socks

## 2022-06-25 NOTE — Assessment & Plan Note (Addendum)
Improving  Extend abx 7 more days Elevate legs  Compression socks

## 2022-06-25 NOTE — Patient Instructions (Signed)
Edema  Edema is an abnormal buildup of fluids in the body tissues and under the skin. Swelling of the legs, feet, and ankles is a common symptom that becomes more likely as you get older. Swelling is also common in looser tissues, such as around the eyes. Pressing on the area may make a temporary dent in your skin (pitting edema). This fluid may also accumulate in your lungs (pulmonary edema). There are many possible causes of edema. Eating too much salt (sodium) and being on your feet or sitting for a long time can cause edema in your legs, feet, and ankles. Common causes of edema include: Certain medical conditions, such as heart failure, liver or kidney disease, and cancer. Weak leg blood vessels. An injury. Pregnancy. Medicines. Being obese. Low protein levels in the blood. Hot weather may make edema worse. Edema is usually painless. Your skin may look swollen or shiny. Follow these instructions at home: Medicines Take over-the-counter and prescription medicines only as told by your health care provider. Your health care provider may prescribe a medicine to help your body get rid of extra water (diuretic). Take this medicine if you are told to take it. Eating and drinking Eat a low-salt (low-sodium) diet to reduce fluid as told by your health care provider. Sometimes, eating less salt may reduce swelling. Depending on the cause of your swelling, you may need to limit how much fluid you drink (fluid restriction). General instructions Raise (elevate) the injured area above the level of your heart while you are sitting or lying down. Do not sit still or stand for long periods of time. Do not wear tight clothing. Do not wear garters on your upper legs. Exercise your legs to get your circulation going. This helps to move the fluid back into your blood vessels, and it may help the swelling go down. Wear compression stockings as told by your health care provider. These stockings help to prevent  blood clots and reduce swelling in your legs. It is important that these are the correct size. These stockings should be prescribed by your health care provider to prevent possible injuries. If elastic bandages or wraps are recommended, use them as told by your health care provider. Contact a health care provider if: Your edema does not get better with treatment. You have heart, liver, or kidney disease and have symptoms of edema. You have sudden and unexplained weight gain. Get help right away if: You develop shortness of breath or chest pain. You cannot breathe when you lie down. You develop pain, redness, or warmth in the swollen areas. You have heart, liver, or kidney disease and suddenly get edema. You have a fever and your symptoms suddenly get worse. These symptoms may be an emergency. Get help right away. Call 911. Do not wait to see if the symptoms will go away. Do not drive yourself to the hospital. Summary Edema is an abnormal buildup of fluids in the body tissues and under the skin. Eating too much salt (sodium)and being on your feet or sitting for a long time can cause edema in your legs, feet, and ankles. Raise (elevate) the injured area above the level of your heart while you are sitting or lying down. Follow your health care provider's instructions about diet and how much fluid you can drink. This information is not intended to replace advice given to you by your health care provider. Make sure you discuss any questions you have with your health care provider. Document Revised: 07/03/2021 Document   Reviewed: 07/03/2021 Elsevier Patient Education  2023 Elsevier Inc.  

## 2022-06-25 NOTE — Progress Notes (Signed)
Subjective:   By signing my name below, I, Cassell Clement, attest that this documentation has been prepared under the direction and in the presence of Donato Schultz DO 06/25/2022   Patient ID: [name] [name], female    DOB: [birth year], 88 y.o.   MRN: [id number]  Chief Complaint  Patient presents with   Cellulitis    Pts states right ankle is still swollen and red. Pt states having pain with walking. Last dose of antibiotic was Thursday.    Follow-up    HPI Patient is in today for an office visit. She is accompanied by her daughter.   Her daughter reports that the patient visited Ms.Dayton Scrape, FNP for pedal edema. She was given 100 Mg of Doxycycline Hyclate and is reported to have finished the medication on 06/21/2022. As of today's visit, her right knee is still swollen and it feels as though her leg is tight. She also feels as though she has a knot in her knee. The redness in her lower extremities are improving. When she stands, both of her legs are bent. [facility] contacted her daughter to inform her that the patient could get the fluid removed from her knee. She denies of any SOB while walking.   She reports that she put some kind of substance on her arms and caused the area to become red. She believes that she might have accidentally used shampoo instead of lotion. After she stopped, the area peeled off. Her daughter states that it could have been a chemical burn. The area has since been resolved.   Past Medical History:  Diagnosis Date   Arthritis    "right thumb" (09/05/2015)   Hyperlipidemia    Hypertension    Hypothyroidism    Osteoporosis     Past Surgical History:  Procedure Laterality Date   CATARACT EXTRACTION W/ INTRAOCULAR LENS  IMPLANT, BILATERAL Bilateral 2010   CLOSED REDUCTION SHOULDER DISLOCATION Right    OVARIAN CYST SURGERY  1949   TONSILLECTOMY      Family History  Problem Relation Age of Onset   Cancer Mother        bladder   Cancer Father         unknown   Ovarian cancer Sister    Heart disease Brother        All 5 brothers have had heart issues starting in their 10s   Breast cancer Daughter     Social History   Socioeconomic History   Marital status: Married    Spouse name: Not on file   Number of children: Not on file   Years of education: Not on file   Highest education level: Not on file  Occupational History   Not on file  Tobacco Use   Smoking status: Never   Smokeless tobacco: Never  Substance and Sexual Activity   Alcohol use: No   Drug use: No   Sexual activity: Yes    Partners: Male  Other Topics Concern   Not on file  Social History Narrative   Lives with her husband in a house    2 children close by to help   Social Determinants of Health   Financial Resource Strain: Low Risk  (03/16/2022)   Overall Financial Resource Strain (CARDIA)    Difficulty of Paying Living Expenses: Not hard at all  Food Insecurity: No Food Insecurity (03/16/2022)   Hunger Vital Sign    Worried About Running Out of Food in the Last Year: Never true  Ran Out of Food in the Last Year: Never true  Transportation Needs: No Transportation Needs (12/08/2021)   PRAPARE - Administrator, Civil Service (Medical): No    Lack of Transportation (Non-Medical): No  Physical Activity: Insufficiently Active (03/16/2022)   Exercise Vital Sign    Days of Exercise per Week: 3 days    Minutes of Exercise per Session: 10 min  Stress: Stress Concern Present (03/16/2022)   [employer] of Occupational Health - Occupational Stress Questionnaire    Feeling of Stress : Rather much  Social Connections: Not on file  Intimate Partner Violence: Not on file    Outpatient Medications Prior to Visit  Medication Sig Dispense Refill   calcium carbonate (OSCAL) 1500 (600 Ca) MG TABS tablet Take 1 tablet by mouth daily.     levothyroxine (SYNTHROID) 112 MCG tablet Take 1 tablet (112 mcg total) by mouth daily. 30 tablet 2   lisinopril  (ZESTRIL) 20 MG tablet TAKE ONE TABLET (20 MG TOTAL) BY MOUTH ONCE DAILY 30 tablet 0   Multiple Vitamins-Minerals (ICAPS AREDS 2 PO) Take 1 tablet by mouth in the morning and at bedtime.     doxycycline (VIBRA-TABS) 100 MG tablet Take 1 tablet (100 mg total) by mouth 2 (two) times daily. (Patient not taking: Reported on 06/25/2022) 14 tablet 0   No facility-administered medications prior to visit.    No Known Allergies  Review of Systems  Constitutional:  Negative for fever and malaise/fatigue.  HENT:  Negative for congestion.   Eyes:  Negative for blurred vision.  Respiratory:  Negative for shortness of breath.   Cardiovascular:  Negative for chest pain, palpitations and leg swelling.  Gastrointestinal:  Negative for abdominal pain, blood in stool and nausea.  Genitourinary:  Negative for dysuria and frequency.  Musculoskeletal:  Negative for falls.  Skin:  Negative for rash.  Neurological:  Negative for dizziness, loss of consciousness and headaches.  Endo/Heme/Allergies:  Negative for environmental allergies.  Psychiatric/Behavioral:  Negative for depression. The patient is not nervous/anxious.        Objective:    Physical Exam Vitals and nursing note reviewed.  Constitutional:      General: She is not in acute distress.    Appearance: Normal appearance. She is not ill-appearing.  HENT:     Head: Normocephalic and atraumatic.     Right Ear: External ear normal.     Left Ear: External ear normal.  Eyes:     Extraocular Movements: Extraocular movements intact.     Pupils: Pupils are equal, round, and reactive to light.  Cardiovascular:     Rate and Rhythm: Normal rate and regular rhythm.     Heart sounds: Normal heart sounds. No murmur heard.    No gallop.  Pulmonary:     Effort: Pulmonary effort is normal. No respiratory distress.     Breath sounds: Normal breath sounds. No wheezing or rales.  Skin:    General: Skin is warm and dry.     Findings: Erythema present. No  rash.  Neurological:     Mental Status: She is alert and oriented to person, place, and time.  Psychiatric:        Judgment: Judgment normal.     BP 130/60 (BP Location: Left Arm, Patient Position: Sitting, Cuff Size: Normal)   Pulse 70   Temp 98.1 F (36.7 C) (Oral)   Resp 18   Ht 5\' 3"  (1.6 m)   Wt 108 lb 9.6 oz (49.3  kg)   SpO2 98%   BMI 19.24 kg/m  Wt Readings from Last 3 Encounters:  06/25/22 108 lb 9.6 oz (49.3 kg)  06/15/22 111 lb 12.8 oz (50.7 kg)  11/23/21 110 lb 3.2 oz (50 kg)    Diabetic Foot Exam - Simple   No data filed    Lab Results  Component Value Date   WBC 6.1 11/23/2021   HGB 14.2 11/23/2021   HCT 43.6 11/23/2021   PLT 149.0 (L) 11/23/2021   GLUCOSE 94 04/04/2022   CHOL 207 (H) 04/04/2022   TRIG 67.0 04/04/2022   HDL 88.00 04/04/2022   LDLDIRECT 157.4 01/29/2011   LDLCALC 105 (H) 04/04/2022   ALT 29 04/04/2022   AST 25 04/04/2022   NA 142 04/04/2022   K 4.7 04/04/2022   CL 106 04/04/2022   CREATININE 0.86 04/04/2022   BUN 25 (H) 04/04/2022   CO2 30 04/04/2022   TSH 32.25 (H) 04/04/2022   INR 0.96 09/05/2015   HGBA1C 5.4 04/04/2022    Lab Results  Component Value Date   TSH 32.25 (H) 04/04/2022   Lab Results  Component Value Date   WBC 6.1 11/23/2021   HGB 14.2 11/23/2021   HCT 43.6 11/23/2021   MCV 94.6 11/23/2021   PLT 149.0 (L) 11/23/2021   Lab Results  Component Value Date   NA 142 04/04/2022   K 4.7 04/04/2022   CO2 30 04/04/2022   GLUCOSE 94 04/04/2022   BUN 25 (H) 04/04/2022   CREATININE 0.86 04/04/2022   BILITOT 0.8 04/04/2022   ALKPHOS 82 04/04/2022   AST 25 04/04/2022   ALT 29 04/04/2022   PROT 6.5 04/04/2022   ALBUMIN 4.0 04/04/2022   CALCIUM 9.5 04/04/2022   ANIONGAP 9 09/06/2015   GFR 58.25 (L) 04/04/2022   Lab Results  Component Value Date   CHOL 207 (H) 04/04/2022   Lab Results  Component Value Date   HDL 88.00 04/04/2022   Lab Results  Component Value Date   LDLCALC 105 (H) 04/04/2022    Lab Results  Component Value Date   TRIG 67.0 04/04/2022   Lab Results  Component Value Date   CHOLHDL 2 04/04/2022   Lab Results  Component Value Date   HGBA1C 5.4 04/04/2022       Assessment & Plan:   Problem List Items Addressed This Visit       Unprioritized   Hypothyroidism   Lower extremity edema    Elevate legs Lasix prn edema Compression socks       Relevant Medications   furosemide (LASIX) 20 MG tablet   Cellulitis of right lower extremity    Improving  Extend abx 7 more days Elevate legs  Compression socks        Relevant Medications   doxycycline (VIBRA-TABS) 100 MG tablet   Other Visit Diagnoses     Chronic pain of right knee    -  Primary   Relevant Orders   DG Knee Complete 4 Views Right (Completed)   Acute right ankle pain       Relevant Orders   DG Ankle Complete Right      Meds ordered this encounter  Medications   doxycycline (VIBRA-TABS) 100 MG tablet    Sig: Take 1 tablet (100 mg total) by mouth 2 (two) times daily.    Dispense:  20 tablet    Refill:  0   furosemide (LASIX) 20 MG tablet    Sig: Take 1 tablet (20 mg total)  by mouth daily.    Dispense:  30 tablet    Refill:  3    I, Donato Schultz, DO, personally preformed the services described in this documentation.  All medical record entries made by the scribe were at my direction and in my presence.  I have reviewed the chart and discharge instructions (if applicable) and agree that the record reflects my personal performance and is accurate and complete. 06/25/2022   I,Amber Collins,acting as a scribe for Donato Schultz, DO.,have documented all relevant documentation on the behalf of Donato Schultz, DO,as directed by  Donato Schultz, DO while in the presence of Donato Schultz, DO.    Donato Schultz, DO

## 2022-06-26 LAB — THYROID PANEL WITH TSH
Free Thyroxine Index: 3.8 (ref 1.4–3.8)
T3 Uptake: 31 % (ref 22–35)
T4, Total: 12.3 ug/dL — ABNORMAL HIGH (ref 5.1–11.9)
TSH: 3.23 mIU/L (ref 0.40–4.50)

## 2022-07-02 ENCOUNTER — Other Ambulatory Visit: Payer: Self-pay | Admitting: Family Medicine

## 2022-07-02 DIAGNOSIS — I10 Essential (primary) hypertension: Secondary | ICD-10-CM

## 2022-07-02 MED ORDER — LISINOPRIL 20 MG PO TABS: ORAL_TABLET | ORAL | 1 refills | Status: DC

## 2022-07-09 ENCOUNTER — Other Ambulatory Visit: Payer: Self-pay

## 2022-07-09 DIAGNOSIS — R600 Localized edema: Secondary | ICD-10-CM

## 2022-07-10 ENCOUNTER — Ambulatory Visit: Payer: Self-pay

## 2022-07-10 ENCOUNTER — Ambulatory Visit: Payer: Medicare Other | Admitting: Family Medicine

## 2022-07-10 VITALS — BP 104/56 | HR 75 | Ht 63.0 in

## 2022-07-10 DIAGNOSIS — M25561 Pain in right knee: Secondary | ICD-10-CM

## 2022-07-10 DIAGNOSIS — R269 Unspecified abnormalities of gait and mobility: Secondary | ICD-10-CM | POA: Diagnosis not present

## 2022-07-10 DIAGNOSIS — G8929 Other chronic pain: Secondary | ICD-10-CM

## 2022-07-10 DIAGNOSIS — R600 Localized edema: Secondary | ICD-10-CM

## 2022-07-10 NOTE — Patient Instructions (Addendum)
Thank you for coming in today.   I've referred you to Home Health Physical Therapy.  Let us know if you don't hear from them in one week.   Check back in 1 month

## 2022-07-10 NOTE — Progress Notes (Signed)
   I, Philbert Riser, LAT, ATC acting as a scribe for Clementeen Graham, MD.  Subjective:    CC: lower leg pain  HPI: Pt is a 88 y/o female c/o R knee and lower leg pain. Of note, pt has been seen by PCP is Aug for pedal edema and R LE cellulitis. Pt locates pain to the anterior aspect of the R ankle.   Swelling: yes- mostly around R ankle, but has greatly improved Numbness/tingling: yes- into R toes Aggravates: walking Treatments tried: antibiotics x2 rounds, Lasix  Dx testing: 06/25/22 R knee & R ankle XR 06/15/22 LE R vasc US  Pertinent review of Systems: No fevers or chills  Relevant historical information: Hypertension.  CKD.   Objective:    Vitals:   07/10/22 1343  BP: (!) 104/56  Pulse: 75  SpO2: 96%   General: Well Developed, well nourished, and in no acute distress.   MSK: Right leg mild edema with mild erythema around the anterior calf and medial calf. Normal foot and ankle motion. Slow and careful gait.  Lab and Radiology Results  Diagnostic Limited MSK Ultrasound of: Right ankle No significant ankle effusion is visible. Hypoechoic fluid collecting along subcutaneous tissue consistent with edema is present. Impression: Lower extremity edema    Impression and Recommendations:    Assessment and Plan: 88 y.o. female with right lower extremity leg swelling thought to be due to edema or now resolving cellulitis or both.  It sounds like she initially had venous stasis dermatitis complicated by cellulitis which has been well treated by her primary care provider with Lasix and antibiotics.  She had a pretty good work-up already including a DVT vascular ultrasound which was negative, an x-ray of her knee and ankle which showed a little bit of degenerative changes but no severe abnormality.  For now focusing on her lower extremity edema is probably the best bet.  Ideally this will be treated with compression but she is good to have a hard time managing compression  stockings.  We talked to her daughters about this and will try to find some solution.  Continue Lasix.  A venous reflux study would be the next reasonable diagnostic test but they want to hold off on that for at least a month which I think is reasonable.  Recheck with me in 1 month.   Additionally she has a gait that is relatively high risk for falls.  We will engage home of physical therapy for fall prevention training.Marland Kitchen  PDMP not reviewed this encounter. Orders Placed This Encounter  Procedures   [place] LIMITED JOINT SPACE STRUCTURES LOW RIGHT(NO LINKED CHARGES)    Order Specific Question:   Reason for Exam (SYMPTOM  OR DIAGNOSIS REQUIRED)    Answer:   right knee pain    Order Specific Question:   Preferred imaging location?    Answer:   [facility] referral to Home Health    Referral Priority:   Routine    Referral Type:   Home Health Care    Referral Reason:   Specialty Services Required    Requested Specialty:   Home Health Services    Number of Visits Requested:   1   No orders of the defined types were placed in this encounter.   Discussed warning signs or symptoms. Please see discharge instructions. Patient expresses understanding.   The above documentation has been reviewed and is accurate and complete Clementeen Graham, M.D.

## 2022-07-12 ENCOUNTER — Telehealth: Payer: Self-pay | Admitting: Family Medicine

## 2022-07-12 NOTE — Telephone Encounter (Signed)
Caller/Agency: [facility] [facility]) Callback Number: [phone] Requesting OT/PT/Skilled Nursing/Social Work/Speech Therapy: PT Frequency: 2 w 4, 1 w 4

## 2022-07-13 ENCOUNTER — Other Ambulatory Visit: Payer: Self-pay | Admitting: Family Medicine

## 2022-07-13 NOTE — Telephone Encounter (Signed)
Verbal given 

## 2022-07-13 NOTE — Telephone Encounter (Signed)
[facility] called to follow up on verbal order request. Advised that there was no additional info at this time but a CMA should be reaching out shortly regarding this.

## 2022-07-23 ENCOUNTER — Telehealth: Payer: Self-pay | Admitting: Family Medicine

## 2022-07-23 NOTE — Telephone Encounter (Signed)
[facility] wanted to advise the medical director has denied hh, and they will be faxing with more information.

## 2022-07-24 ENCOUNTER — Ambulatory Visit: Payer: Medicare Other | Admitting: Podiatry

## 2022-07-24 NOTE — Telephone Encounter (Signed)
FYI

## 2022-07-31 ENCOUNTER — Ambulatory Visit: Payer: Medicare Other | Admitting: Podiatry

## 2022-08-14 ENCOUNTER — Ambulatory Visit: Payer: Medicare Other | Admitting: Family Medicine

## 2022-08-14 VITALS — BP 112/64 | HR 68 | Ht 63.0 in | Wt 108.0 lb

## 2022-08-14 DIAGNOSIS — R600 Localized edema: Secondary | ICD-10-CM

## 2022-08-14 NOTE — Patient Instructions (Signed)
Thank you for coming in today.   I recommend daily compression stockings.   Use lasix daily as needed.   Recheck as needed.

## 2022-08-14 NOTE — Progress Notes (Signed)
   I, Peterson Lombard, LAT, ATC acting as a scribe for Lynne Leader, MD.  [name] [name] is a 88 y.o. female who presents to [place] at [facility] today for f/u R LE edema. Pt was last seen by Dr. Georgina Snell on 07/10/22 and was advised to cont Lasix and to use compression. Today, pt reports R lower leg feels somewhat better, swelling remains, but has improved. Pt c/o a "tightness" around mostly her R ankle.   Dx testing: 06/25/22 R knee & R ankle XR 06/15/22 LE R vasc US  Pertinent review of systems: No fevers or chills  Relevant historical information: Hypertension.  CKD.   Exam:  BP 112/64   Pulse 68   Ht 5\' 3"  (1.6 m)   Wt 108 lb (49 kg)   SpO2 95%   BMI 19.13 kg/m  General: Well Developed, well nourished, and in no acute distress.   MSK: Right leg slight edema.  Minimal tenderness to palpation around the medial ankle.  Normal ankle motion.    Lab and Radiology Results  Vascular ultrasound dated June 15, 2022 Summary:  RIGHT:  - There is no evidence of deep vein thrombosis in the lower extremity.  - There is no evidence of superficial venous thrombosis.  - Fluid noted at the medial knee measuring approximately 2.33 x 1.45 x  2.97 cm      Assessment and Plan: 88 y.o. female with right lower extremity swelling and edema thought to be due to venous insufficiency.  She does have some venous stasis dermatitis changes.  Overall she has improved and is now taking Lasix only intermittently.  We talked about treatment plan and options.  Would recommend compression stockings which she has healthcare aides that could put on her legs every day.  This is an obvious neck step in my opinion.  Recommend continuing Lasix daily as needed.  Check back with me as needed.  Next step if needed could be venous reflux study but I do not think were going to do much with the results so I think it is okay to hold off on that test for now.   Discussed warning signs or symptoms. Please  see discharge instructions. Patient expresses understanding.   The above documentation has been reviewed and is accurate and complete Lynne Leader, M.D.

## 2022-09-25 ENCOUNTER — Ambulatory Visit: Payer: Medicare Other | Admitting: Podiatry

## 2022-09-25 DIAGNOSIS — M79675 Pain in left toe(s): Secondary | ICD-10-CM

## 2022-09-25 DIAGNOSIS — B351 Tinea unguium: Secondary | ICD-10-CM | POA: Diagnosis not present

## 2022-09-25 DIAGNOSIS — M79674 Pain in right toe(s): Secondary | ICD-10-CM | POA: Diagnosis not present

## 2022-09-25 DIAGNOSIS — M79671 Pain in right foot: Secondary | ICD-10-CM

## 2022-09-25 DIAGNOSIS — L84 Corns and callosities: Secondary | ICD-10-CM

## 2022-09-25 NOTE — Progress Notes (Signed)
  Subjective:  Patient ID: [name] [name], female    DOB: 03-11-1927,  MRN: [id number]  SAHARA FUJIMOTO presents to clinic today for preulcerative lesion(s) right lower extremity and painful mycotic toenails that limit ambulation. Painful toenails interfere with ambulation. Aggravating factors include wearing enclosed shoe gear. Pain is relieved with periodic professional debridement. Painful porokeratotic lesions are aggravated when weightbearing with and without shoegear. Pain is relieved with periodic professional debridement.   Patient is accompanied by her daughter on today's visit. Daughter states her dad does not come out as much.  Chief Complaint  Patient presents with   Nail Problem    Nail trim  Not Diabetic Dr Laury Axon , last OV June 25 2022   New problem(s): None.   PCP is Zola Button, Grayling Congress, DO , and last visit was June 25, 2022.  No Known Allergies  Review of Systems: Negative except as noted in the HPI.  Objective: No changes noted in today's physical examination.  [name] [name] is a pleasant 88 y.o. female thin build in NAD. AAO x 3. Vascular CFT immediate b/l LE. Palpable DP/PT pulses b/l LE. Digital hair sparse b/l. Skin temperature gradient WNL b/l. No pain with calf compression b/l. No edema noted b/l. No cyanosis or clubbing noted b/l LE.  Neurologic Normal speech. Oriented to person, place, and time. Protective sensation intact 5/5 intact bilaterally with 10g monofilament b/l.  Dermatologic Pedal integument with normal turgor, texture and tone b/l LE. No open wounds b/l. No interdigital macerations b/l.   Toenails 1-5 b/l elongated, thickened, discolored with subungual debris. +Tenderness with dorsal palpation of nailplates.   Preulcerative lesion noted submet head 1 bilaterally. There is visible subdermal hemorrhage. There is no surrounding erythema, no edema, no drainage, no odor, no fluctuance.   Orthopedic: Muscle strength 5/5 to all lower extremity muscle  groups bilaterally. HAV with bunion deformity noted b/l LE. Utilizes rollator for ambulation assistance.   Assessment/Plan: 1. Pain due to onychomycosis of toenails of both feet     No orders of the defined types were placed in this encounter.   -Patient's family member present. All questions/concerns addressed on today's visit. -Toenails 1-5 b/l were debrided in length and girth with sterile nail nippers and dremel without iatrogenic bleeding.  -Patient/POA declines corn/callus debridement on today. Discussed weekly use of pumice stone and moisturizer for management. Avoid chemical corn/callus removers and use of sharp instrumentation. Avoid pedicures with sharp instrumentation for corns/calluses. -Patient/POA to call should there be question/concern in the interim.   Return in about 3 months (around 12/26/2022).  [name], DPM

## 2022-09-30 ENCOUNTER — Encounter: Payer: Self-pay | Admitting: Podiatry

## 2022-10-12 ENCOUNTER — Ambulatory Visit: Payer: Medicare Other | Admitting: Family Medicine

## 2022-10-19 DIAGNOSIS — H903 Sensorineural hearing loss, bilateral: Secondary | ICD-10-CM | POA: Diagnosis not present

## 2022-10-24 ENCOUNTER — Other Ambulatory Visit: Payer: Self-pay | Admitting: Family Medicine

## 2022-10-24 ENCOUNTER — Telehealth: Payer: Self-pay | Admitting: Family Medicine

## 2022-10-24 DIAGNOSIS — I10 Essential (primary) hypertension: Secondary | ICD-10-CM

## 2022-10-24 DIAGNOSIS — E039 Hypothyroidism, unspecified: Secondary | ICD-10-CM

## 2022-10-24 DIAGNOSIS — G8929 Other chronic pain: Secondary | ICD-10-CM

## 2022-10-24 DIAGNOSIS — R413 Other amnesia: Secondary | ICD-10-CM

## 2022-10-24 NOTE — Telephone Encounter (Signed)
Patient's daughter [name] [name] called to see if she could request home health for her mom. Mom cannot see or hear very well. Her husband has home health but they cannot assist her. She isn't sure how to start the process. Please call her to discuss.

## 2022-10-24 NOTE — Telephone Encounter (Signed)
Noted  

## 2022-10-24 NOTE — Telephone Encounter (Signed)
Okay to place? 

## 2022-10-30 ENCOUNTER — Other Ambulatory Visit: Payer: Self-pay | Admitting: Family Medicine

## 2022-10-30 NOTE — Addendum Note (Signed)
Addended by: Seabron Spates R on: 10/30/2022 12:26 PM   Modules accepted: Orders

## 2022-10-30 NOTE — Addendum Note (Signed)
Addended by: Seabron Spates R on: 10/30/2022 12:24 PM   Modules accepted: Orders

## 2022-10-31 ENCOUNTER — Other Ambulatory Visit: Payer: Self-pay | Admitting: Family Medicine

## 2022-10-31 DIAGNOSIS — I10 Essential (primary) hypertension: Secondary | ICD-10-CM

## 2022-11-20 ENCOUNTER — Other Ambulatory Visit: Payer: Self-pay | Admitting: Family Medicine

## 2022-11-26 ENCOUNTER — Encounter: Payer: Medicare Other | Admitting: Family Medicine

## 2022-11-27 ENCOUNTER — Ambulatory Visit: Payer: Medicare Other | Admitting: Podiatry

## 2022-12-23 ENCOUNTER — Encounter (HOSPITAL_BASED_OUTPATIENT_CLINIC_OR_DEPARTMENT_OTHER): Payer: Self-pay | Admitting: Emergency Medicine

## 2022-12-23 ENCOUNTER — Emergency Department (HOSPITAL_BASED_OUTPATIENT_CLINIC_OR_DEPARTMENT_OTHER): Payer: Medicare Other

## 2022-12-23 ENCOUNTER — Other Ambulatory Visit: Payer: Self-pay

## 2022-12-23 ENCOUNTER — Emergency Department (HOSPITAL_BASED_OUTPATIENT_CLINIC_OR_DEPARTMENT_OTHER)
Admission: EM | Admit: 2022-12-23 | Discharge: 2022-12-23 | Disposition: A | Discharge status: Home / Self Care | Payer: Medicare Other | Attending: Emergency Medicine | Admitting: Emergency Medicine

## 2022-12-23 DIAGNOSIS — I10 Essential (primary) hypertension: Secondary | ICD-10-CM | POA: Diagnosis not present

## 2022-12-23 DIAGNOSIS — S5012XA Contusion of left forearm, initial encounter: Secondary | ICD-10-CM | POA: Insufficient documentation

## 2022-12-23 DIAGNOSIS — S59912A Unspecified injury of left forearm, initial encounter: Secondary | ICD-10-CM | POA: Diagnosis not present

## 2022-12-23 DIAGNOSIS — W19XXXA Unspecified fall, initial encounter: Secondary | ICD-10-CM | POA: Diagnosis not present

## 2022-12-23 DIAGNOSIS — E039 Hypothyroidism, unspecified: Secondary | ICD-10-CM | POA: Insufficient documentation

## 2022-12-23 DIAGNOSIS — Z79899 Other long term (current) drug therapy: Secondary | ICD-10-CM | POA: Diagnosis not present

## 2022-12-23 DIAGNOSIS — Z043 Encounter for examination and observation following other accident: Secondary | ICD-10-CM | POA: Diagnosis not present

## 2022-12-23 DIAGNOSIS — R5383 Other fatigue: Secondary | ICD-10-CM | POA: Diagnosis not present

## 2022-12-23 DIAGNOSIS — R350 Frequency of micturition: Secondary | ICD-10-CM | POA: Diagnosis not present

## 2022-12-23 DIAGNOSIS — G319 Degenerative disease of nervous system, unspecified: Secondary | ICD-10-CM | POA: Diagnosis not present

## 2022-12-23 DIAGNOSIS — F0390 Unspecified dementia without behavioral disturbance: Secondary | ICD-10-CM | POA: Diagnosis not present

## 2022-12-23 DIAGNOSIS — M5030 Other cervical disc degeneration, unspecified cervical region: Secondary | ICD-10-CM | POA: Diagnosis not present

## 2022-12-23 DIAGNOSIS — S7002XA Contusion of left hip, initial encounter: Secondary | ICD-10-CM | POA: Insufficient documentation

## 2022-12-23 LAB — CBC WITH DIFFERENTIAL/PLATELET
Abs Immature Granulocytes: 0.02 10*3/uL (ref 0.00–0.07)
Basophils Absolute: 0 10*3/uL (ref 0.0–0.1)
Basophils Relative: 0 %
Eosinophils Absolute: 0.1 10*3/uL (ref 0.0–0.5)
Eosinophils Relative: 1 %
HCT: 42.9 % (ref 36.0–46.0)
Hemoglobin: 14.3 g/dL (ref 12.0–15.0)
Immature Granulocytes: 0 %
Lymphocytes Relative: 11 %
Lymphs Abs: 1.1 10*3/uL (ref 0.7–4.0)
MCH: 31 pg (ref 26.0–34.0)
MCHC: 33.3 g/dL (ref 30.0–36.0)
MCV: 93.1 fL (ref 80.0–100.0)
Monocytes Absolute: 0.9 10*3/uL (ref 0.1–1.0)
Monocytes Relative: 9 %
Neutro Abs: 7.6 10*3/uL (ref 1.7–7.7)
Neutrophils Relative %: 79 %
Platelets: 145 10*3/uL — ABNORMAL LOW (ref 150–400)
RBC: 4.61 MIL/uL (ref 3.87–5.11)
RDW: 13.3 % (ref 11.5–15.5)
WBC: 9.6 10*3/uL (ref 4.0–10.5)
nRBC: 0 % (ref 0.0–0.2)

## 2022-12-23 LAB — URINALYSIS, W/ REFLEX TO CULTURE (INFECTION SUSPECTED)
Bilirubin Urine: NEGATIVE
Glucose, UA: NEGATIVE mg/dL
Ketones, ur: NEGATIVE mg/dL
Leukocytes,Ua: NEGATIVE
Nitrite: NEGATIVE
Protein, ur: NEGATIVE mg/dL
Specific Gravity, Urine: 1.01 (ref 1.005–1.030)
pH: 6 (ref 5.0–8.0)

## 2022-12-23 LAB — COMPREHENSIVE METABOLIC PANEL
ALT: 23 U/L (ref 0–44)
AST: 34 U/L (ref 15–41)
Albumin: 3.4 g/dL — ABNORMAL LOW (ref 3.5–5.0)
Alkaline Phosphatase: 74 U/L (ref 38–126)
Anion gap: 7 (ref 5–15)
BUN: 15 mg/dL (ref 8–23)
CO2: 25 mmol/L (ref 22–32)
Calcium: 8.9 mg/dL (ref 8.9–10.3)
Chloride: 103 mmol/L (ref 98–111)
Creatinine, Ser: 0.55 mg/dL (ref 0.44–1.00)
GFR, Estimated: >60 mL/min (ref >60)
Glucose, Bld: 86 mg/dL (ref 70–99)
Potassium: 3.9 mmol/L (ref 3.5–5.1)
Sodium: 135 mmol/L (ref 135–145)
Total Bilirubin: 1.3 mg/dL — ABNORMAL HIGH (ref 0.3–1.2)
Total Protein: 6.6 g/dL (ref 6.5–8.1)

## 2022-12-23 LAB — CK: Total CK: 300 U/L — ABNORMAL HIGH (ref 38–234)

## 2022-12-23 MED ORDER — SODIUM CHLORIDE 0.9 % IV BOLUS
500.0000 mL | Freq: Once | INTRAVENOUS | Status: AC
Start: 2022-12-23 — End: 2022-12-23
  Administered 2022-12-23: 500 mL via INTRAVENOUS

## 2022-12-23 NOTE — ED Notes (Signed)
Pt picked up and moved over to the bed with 2 person carry. No incident or injury noted.

## 2022-12-23 NOTE — Discharge Instructions (Addendum)
You have been seen and discharged from the emergency department.  Your head CT, cervical spine CT, and x-rays were negative.  Urinalysis showed no UTI.  Blood work was baseline and unremarkable.  You were given a small amount of IV fluid.  A home health face-to-face order has been placed for you.  Follow-up with your primary provider for further evaluation and further care. Take home medications as prescribed. If you have any worsening symptoms or further concerns for your health please return to an emergency department for further evaluation.

## 2022-12-23 NOTE — ED Notes (Signed)
Pt notes to have fresh bruises to both hips and sacrum

## 2022-12-23 NOTE — ED Provider Notes (Signed)
[place] [facility] [facility] Provider Note   CSN: [id number] Arrival date & time: 12/23/22  1227     History  Chief Complaint  Patient presents with   Fall   Urinary Frequency    [name] [name] is a 89 y.o. female.  HPI   89 year old female with past medical history of HTN, HLD, hypothyroidism presents to the emergency department after presumed fall.  Patient lives at home with her husband.  There was a caregiver there last night around 9 PM.  It is presumed that the patient at some point went to the bathroom because this morning around 10 AM she was found on her back in the bathroom with urine and stool around her.  Patient is hard of hearing with presumed developing dementia.  It is unclear how long the patient has been on the floor.  But the husband revealed that she had been "not feeling well for the past 2 days".  But the family members at bedside did not reveal any other signs of acute illness including fever, cough, vomiting, diarrhea.  They mention a strong odor of urine at the house.  Home Medications Prior to Admission medications   Medication Sig Start Date End Date Taking? Authorizing Provider  calcium carbonate (OSCAL) 1500 (600 Ca) MG TABS tablet Take 1 tablet by mouth daily.    [provider]  furosemide (LASIX) 20 MG tablet Take 1 tablet (20 mg total) by mouth daily. 06/25/22   Roma Schanz R, DO  levothyroxine (SYNTHROID) 112 MCG tablet TAKE ONE TABLET BY MOUTH ONCE DAILY 11/20/22   Carollee Herter, Alferd Apa, DO  lisinopril (ZESTRIL) 20 MG tablet Take 1 tablet (20 mg total) by mouth daily. 10/31/22   Ann Held, DO  Multiple Vitamins-Minerals (ICAPS AREDS 2 PO) Take 1 tablet by mouth in the morning and at bedtime.    [provider]      Allergies    Patient has no known allergies.    Review of Systems   Review of Systems  Constitutional:  Positive for fatigue. Negative for fever.  Respiratory:  Negative  for shortness of breath.   Cardiovascular:  Negative for chest pain.  Gastrointestinal:  Negative for abdominal pain, diarrhea and vomiting.  Musculoskeletal:  Negative for back pain and neck pain.       Bruising to left forearm and left hip  Skin:  Negative for rash.  Neurological:  Negative for headaches.    Physical Exam Updated Vital Signs BP (!) 165/68 (BP Location: Left Arm)   Pulse 63   Temp (!) 97.5 F (36.4 C) (Tympanic)   Resp 13   Wt 49 kg   SpO2 99%   BMI 19.13 kg/m  Physical Exam Vitals and nursing note reviewed.  Constitutional:      General: She is not in acute distress.    Appearance: Normal appearance.  HENT:     Head: Normocephalic and atraumatic.     Mouth/Throat:     Mouth: Mucous membranes are moist.  Eyes:     Extraocular Movements: Extraocular movements intact.     Pupils: Pupils are equal, round, and reactive to light.  Cardiovascular:     Rate and Rhythm: Normal rate.  Pulmonary:     Effort: Pulmonary effort is normal. No respiratory distress.  Abdominal:     Palpations: Abdomen is soft.     Tenderness: There is no abdominal tenderness.  Musculoskeletal:     Cervical  back: No rigidity or tenderness.     Comments: Bruising to left forearm and left hip  Skin:    General: Skin is warm.  Neurological:     Mental Status: She is alert and oriented to person, place, and time. Mental status is at baseline.     Comments: HOH but oriented at baseline  Psychiatric:        Mood and Affect: Mood normal.     ED Results / Procedures / Treatments   Labs (all labs ordered are listed, but only abnormal results are displayed) Labs Reviewed  URINALYSIS, W/ REFLEX TO CULTURE (INFECTION SUSPECTED) - Abnormal; Notable for the following components:      Result Value   Hgb urine dipstick SMALL (*)    Bacteria, UA FEW (*)    All other components within normal limits  CBC WITH DIFFERENTIAL/PLATELET  COMPREHENSIVE METABOLIC PANEL  CK     EKG None  Radiology CT Head Wo Contrast  Result Date: 12/23/2022 CLINICAL DATA:  Patient was found on the bathroom floor. Unwitnessed fall. EXAM: CT HEAD WITHOUT CONTRAST CT CERVICAL SPINE WITHOUT CONTRAST TECHNIQUE: Multidetector CT imaging of the head and cervical spine was performed following the standard protocol without intravenous contrast. Multiplanar CT image reconstructions of the cervical spine were also generated. RADIATION DOSE REDUCTION: This exam was performed according to the departmental dose-optimization program which includes automated exposure control, adjustment of the mA and/or kV according to patient size and/or use of iterative reconstruction technique. COMPARISON:  11/19/2004 FINDINGS: CT HEAD FINDINGS Brain: There is no evidence for acute hemorrhage, hydrocephalus, mass lesion, or abnormal extra-axial fluid collection. No definite CT evidence for acute infarction. Diffuse loss of parenchymal volume is consistent with atrophy. Patchy low attenuation in the deep hemispheric and periventricular white matter is nonspecific, but likely reflects chronic microvascular ischemic demyelination. Vascular: No hyperdense vessel or unexpected calcification. Skull: Normal. Negative for fracture or focal lesion. Sinuses/Orbits: The visualized paranasal sinuses and mastoid air cells are clear. Visualized portions of the globes and intraorbital fat are unremarkable. Other: None. CT CERVICAL SPINE FINDINGS Alignment: Reversal of normal cervical lordosis. Motion artifact noted at the level of C5-6. Skull base and vertebrae: No acute fracture. No primary bone lesion or focal pathologic process. Soft tissues and spinal canal: No prevertebral fluid or swelling. No visible canal hematoma. Disc levels: Loss of disc height noted C3-4, C4-5, C5-6, and C6-7. Relatively diffuse facet osteoarthritis noted bilaterally. Upper chest: Unremarkable. Other: None IMPRESSION: 1. No acute intracranial abnormality. 2.  Atrophy with chronic small vessel ischemic disease. 3. No evidence for cervical spine fracture or subluxation. 4. Degenerative changes in the cervical spine as above. Electronically Signed   By: Misty Stanley M.D.   On: 12/23/2022 13:46   CT CERVICAL SPINE WO CONTRAST  Result Date: 12/23/2022 CLINICAL DATA:  Patient was found on the bathroom floor. Unwitnessed fall. EXAM: CT HEAD WITHOUT CONTRAST CT CERVICAL SPINE WITHOUT CONTRAST TECHNIQUE: Multidetector CT imaging of the head and cervical spine was performed following the standard protocol without intravenous contrast. Multiplanar CT image reconstructions of the cervical spine were also generated. RADIATION DOSE REDUCTION: This exam was performed according to the departmental dose-optimization program which includes automated exposure control, adjustment of the mA and/or kV according to patient size and/or use of iterative reconstruction technique. COMPARISON:  11/19/2004 FINDINGS: CT HEAD FINDINGS Brain: There is no evidence for acute hemorrhage, hydrocephalus, mass lesion, or abnormal extra-axial fluid collection. No definite CT evidence for acute infarction. Diffuse loss of  parenchymal volume is consistent with atrophy. Patchy low attenuation in the deep hemispheric and periventricular white matter is nonspecific, but likely reflects chronic microvascular ischemic demyelination. Vascular: No hyperdense vessel or unexpected calcification. Skull: Normal. Negative for fracture or focal lesion. Sinuses/Orbits: The visualized paranasal sinuses and mastoid air cells are clear. Visualized portions of the globes and intraorbital fat are unremarkable. Other: None. CT CERVICAL SPINE FINDINGS Alignment: Reversal of normal cervical lordosis. Motion artifact noted at the level of C5-6. Skull base and vertebrae: No acute fracture. No primary bone lesion or focal pathologic process. Soft tissues and spinal canal: No prevertebral fluid or swelling. No visible canal  hematoma. Disc levels: Loss of disc height noted C3-4, C4-5, C5-6, and C6-7. Relatively diffuse facet osteoarthritis noted bilaterally. Upper chest: Unremarkable. Other: None IMPRESSION: 1. No acute intracranial abnormality. 2. Atrophy with chronic small vessel ischemic disease. 3. No evidence for cervical spine fracture or subluxation. 4. Degenerative changes in the cervical spine as above. Electronically Signed   By: Misty Stanley M.D.   On: 12/23/2022 13:46    Procedures Procedures    Medications Ordered in ED Medications  sodium chloride 0.9 % bolus 500 mL (has no administration in time range)    ED Course/ Medical Decision Making/ A&P                             Medical Decision Making Amount and/or Complexity of Data Reviewed Labs: ordered. Radiology: ordered.   89 year old female presents to the emergency department with concern for being found on the ground of the bathroom.  Patient is hard of hearing but appears to be oriented per baseline.  Family members at bedside reveal developing dementia with the patient.  It appears she went to use the bathroom and fell, unknown downtime.  Her main complaint is left forearm and hip pain.  The family members at bedside appreciate her to be more confused than baseline.  Vital signs are normal here.  EKG is sinus rhythm.  Urinalysis shows no UTI.  Blood work is reassuring without any acute metabolic abnormalities.  CT of the head and cervical spine show no acute finding.  X-ray of the forearm and hip show no acute fracture.  Patient does have periodic confusion, they have an outpatient appointment with her primary doctor to address symptoms of dementia.  I will place a home health order for face-to-face evaluation.  Otherwise patient appears to be in baseline health, will go home with family members who will stay the night with her.  Patient at this time appears safe and stable for discharge and close outpatient follow up. Discharge plan and  strict return to ED precautions discussed, patient verbalizes understanding and agreement.        Final Clinical Impression(s) / ED Diagnoses Final diagnoses:  None    Rx / DC Orders ED Discharge Orders     None         Lorelle Gibbs, DO 12/23/22 2043

## 2022-12-23 NOTE — ED Notes (Signed)
Pt states she had to have BM , states she has been constipated , had large soft BM and voided, pt cleaned , chux changed and pt sat up in bed

## 2022-12-23 NOTE — ED Triage Notes (Signed)
Pt arrives pov, to triage in wheelchair, endorses pt found on bathroom floor this am. Unknown time of fall. Daughter also endorses concern for UTI d/t dark cloudy urine. Denies thinners. Daughter  endorses LT side bruising on arm and buttocks

## 2023-01-01 ENCOUNTER — Encounter: Payer: Self-pay | Admitting: Family Medicine

## 2023-01-01 ENCOUNTER — Ambulatory Visit (INDEPENDENT_AMBULATORY_CARE_PROVIDER_SITE_OTHER): Payer: Medicare Other | Admitting: Family Medicine

## 2023-01-01 VITALS — BP 120/68 | HR 71 | Temp 98.2°F | Resp 16 | Ht 63.0 in | Wt 108.2 lb

## 2023-01-01 DIAGNOSIS — R413 Other amnesia: Secondary | ICD-10-CM

## 2023-01-01 DIAGNOSIS — I10 Essential (primary) hypertension: Secondary | ICD-10-CM

## 2023-01-01 DIAGNOSIS — E039 Hypothyroidism, unspecified: Secondary | ICD-10-CM

## 2023-01-01 DIAGNOSIS — Z23 Encounter for immunization: Secondary | ICD-10-CM | POA: Diagnosis not present

## 2023-01-01 DIAGNOSIS — W19XXXA Unspecified fall, initial encounter: Secondary | ICD-10-CM | POA: Diagnosis not present

## 2023-01-01 LAB — LIPID PANEL
Cholesterol: 194 mg/dL (ref 0–200)
HDL: 77.6 mg/dL (ref >39.00)
LDL Cholesterol: 103 mg/dL — ABNORMAL HIGH (ref 0–99)
NonHDL: 116.05
Total CHOL/HDL Ratio: 2
Triglycerides: 64 mg/dL (ref 0.0–149.0)
VLDL: 12.8 mg/dL (ref 0.0–40.0)

## 2023-01-01 LAB — TSH: TSH: 5.32 u[IU]/mL (ref 0.35–5.50)

## 2023-01-01 MED ORDER — LEVOTHYROXINE SODIUM 112 MCG PO TABS: 112.0000 ug | ORAL_TABLET | Freq: Every day | ORAL | 1 refills | Status: DC

## 2023-01-01 MED ORDER — LISINOPRIL 20 MG PO TABS: 20.0000 mg | ORAL_TABLET | Freq: Every day | ORAL | 1 refills | Status: DC

## 2023-01-01 NOTE — Patient Instructions (Signed)
Preventive Care 87 Years and Older, Female Preventive care refers to lifestyle choices and visits with your health care provider that can promote health and wellness. Preventive care visits are also called wellness exams. What can I expect for my preventive care visit? Counseling Your health care provider may ask you questions about your: Medical history, including: Past medical problems. Family medical history. Pregnancy and menstrual history. History of falls. Current health, including: Memory and ability to understand (cognition). Emotional well-being. Home life and relationship well-being. Sexual activity and sexual health. Lifestyle, including: Alcohol, nicotine or tobacco, and drug use. Access to firearms. Diet, exercise, and sleep habits. Work and work environment. Sunscreen use. Safety issues such as seatbelt and bike helmet use. Physical exam Your health care provider will check your: Height and weight. These may be used to calculate your BMI (body mass index). BMI is a measurement that tells if you are at a healthy weight. Waist circumference. This measures the distance around your waistline. This measurement also tells if you are at a healthy weight and may help predict your risk of certain diseases, such as type 2 diabetes and high blood pressure. Heart rate and blood pressure. Body temperature. Skin for abnormal spots. What immunizations do I need?  Vaccines are usually given at various ages, according to a schedule. Your health care provider will recommend vaccines for you based on your age, medical history, and lifestyle or other factors, such as travel or where you work. What tests do I need? Screening Your health care provider may recommend screening tests for certain conditions. This may include: Lipid and cholesterol levels. Hepatitis C test. Hepatitis B test. HIV (human immunodeficiency virus) test. STI (sexually transmitted infection) testing, if you are at  risk. Lung cancer screening. Colorectal cancer screening. Diabetes screening. This is done by checking your blood sugar (glucose) after you have not eaten for a while (fasting). Mammogram. Talk with your health care provider about how often you should have regular mammograms. BRCA-related cancer screening. This may be done if you have a family history of breast, ovarian, tubal, or peritoneal cancers. Bone density scan. This is done to screen for osteoporosis. Talk with your health care provider about your test results, treatment options, and if necessary, the need for more tests. Follow these instructions at home: Eating and drinking  Eat a diet that includes fresh fruits and vegetables, whole grains, lean protein, and low-fat dairy products. Limit your intake of foods with high amounts of sugar, saturated fats, and salt. Take vitamin and mineral supplements as recommended by your health care provider. Do not drink alcohol if your health care provider tells you not to drink. If you drink alcohol: Limit how much you have to 0-1 drink a day. Know how much alcohol is in your drink. In the [place] one drink equals one 12 oz bottle of beer (355 mL), one 5 oz glass of wine (148 mL), or one 1 oz glass of hard liquor (44 mL). Lifestyle Brush your teeth every morning and night with fluoride toothpaste. Floss one time each day. Exercise for at least 30 minutes 5 or more days each week. Do not use any products that contain nicotine or tobacco. These products include cigarettes, chewing tobacco, and vaping devices, such as e-cigarettes. If you need help quitting, ask your health care provider. Do not use drugs. If you are sexually active, practice safe sex. Use a condom or other form of protection in order to prevent STIs. Take aspirin only as told by   your health care provider. Make sure that you understand how much to take and what form to take. Work with your health care provider to find out whether it  is safe and beneficial for you to take aspirin daily. Ask your health care provider if you need to take a cholesterol-lowering medicine (statin). Find healthy ways to manage stress, such as: Meditation, yoga, or listening to music. Journaling. Talking to a trusted person. Spending time with friends and family. Minimize exposure to UV radiation to reduce your risk of skin cancer. Safety Always wear your seat belt while driving or riding in a vehicle. Do not drive: If you have been drinking alcohol. Do not ride with someone who has been drinking. When you are tired or distracted. While texting. If you have been using any mind-altering substances or drugs. Wear a helmet and other protective equipment during sports activities. If you have firearms in your house, make sure you follow all gun safety procedures. What's next? Visit your health care provider once a year for an annual wellness visit. Ask your health care provider how often you should have your eyes and teeth checked. Stay up to date on all vaccines. This information is not intended to replace advice given to you by your health care provider. Make sure you discuss any questions you have with your health care provider. Document Revised: 04/26/2021 Document Reviewed: 04/26/2021 Elsevier Patient Education  [place] for Falls Each year, millions of people have serious injuries from falls. It is important to understand your risk for falling. Talk with your health care provider about your risk and what you can do to lower it. There are actions you can take at home to lower your risk and prevent falls. If you do have a serious fall, make sure to tell your health care provider. Falling once raises your risk of falling again. How can falls affect me? Serious injuries from falls are common. These include: Broken bones, such as hip fractures. Head injuries, such as traumatic brain injuries (TBI) or  concussion. A fear of falling can cause you to avoid activities and stay at home. This can make your muscles weaker and actually raise your risk for a fall. What can increase my risk? There are a number of risk factors that increase your risk for falling. The more risk factors you have, the higher your risk of falling. Serious injuries from a fall happen most often to people older than age 13. Children and young adults ages 7-29 are also at higher risk. Common risk factors include: Weakness in the lower body. Lack (deficiency) of vitamin D. Being generally weak or confused due to long-term (chronic) illness. Dizziness or balance problems. Poor vision. Medicines that cause dizziness or drowsiness. These can include medicines for your blood pressure, heart, anxiety, insomnia, or edema, as well as pain medicines and muscle relaxants. Other risk factors include: Drinking alcohol. Having had a fall in the past. Having depression. Having foot pain or wearing improper footwear. Working at a dangerous job. Having any of the following in your home: Tripping hazards, such as floor clutter or loose rugs. Poor lighting. Pets. Having dementia or memory loss. What actions can I take to lower my risk of falling?     Physical activity Maintain physical fitness. Do strength and balance exercises. Consider taking a regular class to build strength and balance. Yoga and tai chi are good options. Vision Have your eyes checked every year and your vision prescription updated  as needed. Walking aids and footwear Wear nonskid shoes. Do not wear high heels. Do not walk around the house in socks or slippers. Use a cane or walker as told by your health care provider. Home safety Attach secure railings on both sides of your stairs. Install grab bars for your tub, shower, and toilet. Use a bath mat in your tub or shower. Use good lighting in all rooms. Keep a flashlight near your bed. Make sure there is a  clear path from your bed to the bathroom. Use night-lights. Do not use throw rugs. Make sure all carpeting is taped or tacked down securely. Remove all clutter from walkways and stairways, including extension cords. Repair uneven or broken steps. Avoid walking on icy or slippery surfaces. Walk on the grass instead of on icy or slick sidewalks. Use ice melt to get rid of ice on walkways. Use a cordless phone. Questions to ask your health care provider Can you help me check my risk for a fall? Do any of my medicines make me more likely to fall? Should I take a vitamin D supplement? What exercises can I do to improve my strength and balance? Should I make an appointment to have my vision checked? Do I need a bone density test to check for weak bones or osteoporosis? Would it help to use a cane or a walker? Where to find more information Centers for Disease Control and Prevention, STEADI: http://www.wolf.info/ Community-Based Fall Prevention Programs: http://www.wolf.info/ National Institute on Aging: http://kim-miller.com/ Contact a health care provider if: You fall at home. You are afraid of falling at home. You feel weak, drowsy, or dizzy. Summary Serious injuries from a fall happen most often to people older than age 29. Children and young adults ages 93-29 are also at higher risk. Talk with your health care provider about your risks for falling and how to lower those risks. Taking certain precautions at home can lower your risk for falling. If you fall, always tell your health care provider. This information is not intended to replace advice given to you by your health care provider. Make sure you discuss any questions you have with your health care provider. Document Revised: 06/01/2020 Document Reviewed: 06/01/2020 Elsevier Patient Education  [place].

## 2023-01-01 NOTE — Progress Notes (Signed)
Subjective:   By signing my name below, I, Shehryar Baig, attest that this documentation has been prepared under the direction and in the presence of Ann Held, DO. 01/01/2023   Patient ID: [name] [name], female    DOB: Nov 25, 1926, 89 y.o.   MRN: [id number]  Chief Complaint  Patient presents with  . Annual Exam    Pt states fasting     HPI Patient is in today for a comprehensive physical exam. Her daughter is present with her during this visit.   She complains of feeling tired constantly. She has lost weight recently. Her daughter reports she weighed 114 prior to losing weight.  Wt Readings from Last 3 Encounters:  01/01/23 108 lb 3.2 oz (49.1 kg)  12/23/22 108 lb (49 kg)  08/14/22 108 lb (49 kg)   She reports having a fall recently. She was not found for a while after falling. She continues having soreness in her hips following the fall. She does not remember anything prior to the fall.  Her daughter is requesting home health to visit patient at home due to her patient having less stability while on her feet. She is currently in a wheelchair to help assist with mobility.  She continues having swelling in her legs. She has seen a orthopedist specialist and was referred to physical therapy. She has had 2 sessions but stopped. She is not interested in taking fluid medication due to causing her to urinate frequently. She is having difficulty wearing compression socks but is willing to try again.  She denies fever, new moles, congestion, sinus pain, sore throat, chest pain, palpitations, cough, shortness of breath, wheezing, nausea, vomiting, abdominal pain, diarrhea, constipation, dysuria, frequency, hematuria, new muscle pain, new joint pain, or headaches at this time.  She is interested in flu vaccine during this visit.  Bone density was last completed 06/06/2020. Results showed she is osteoporotic. Repeat in 2 years.    Past Medical History:  Diagnosis Date  . Arthritis     "right thumb" (09/05/2015)  . Hyperlipidemia   . Hypertension   . Hypothyroidism   . Osteoporosis     Past Surgical History:  Procedure Laterality Date  . CATARACT EXTRACTION W/ INTRAOCULAR LENS  IMPLANT, BILATERAL Bilateral 2010  . CLOSED REDUCTION SHOULDER DISLOCATION Right   . OVARIAN CYST SURGERY  1949  . TONSILLECTOMY      Family History  Problem Relation Age of Onset  . Cancer Mother        bladder  . Cancer Father        unknown  . Ovarian cancer Sister   . Heart disease Brother        All 5 brothers have had heart issues starting in their 33s  . Breast cancer Daughter     Social History   Socioeconomic History  . Marital status: Married    Spouse name: Not on file  . Number of children: Not on file  . Years of education: Not on file  . Highest education level: Not on file  Occupational History  . Not on file  Tobacco Use  . Smoking status: Never  . Smokeless tobacco: Never  Substance and Sexual Activity  . Alcohol use: No  . Drug use: No  . Sexual activity: Yes    Partners: Male  Other Topics Concern  . Not on file  Social History Narrative   Lives with her husband in a house    2 children close by  to help   Social Determinants of Health   Financial Resource Strain: Low Risk  (03/16/2022)   Overall Financial Resource Strain (CARDIA)   . Difficulty of Paying Living Expenses: Not hard at all  Food Insecurity: No Food Insecurity (03/16/2022)   Hunger Vital Sign   . Worried About Charity fundraiser in the Last Year: Never true   . Ran Out of Food in the Last Year: Never true  Transportation Needs: No Transportation Needs (12/08/2021)   PRAPARE - Transportation   . Lack of Transportation (Medical): No   . Lack of Transportation (Non-Medical): No  Physical Activity: Insufficiently Active (03/16/2022)   Exercise Vital Sign   . Days of Exercise per Week: 3 days   . Minutes of Exercise per Session: 10 min  Stress: Stress Concern Present (03/16/2022)    [place]   . Feeling of Stress : Rather much  Social Connections: Not on file  Intimate Partner Violence: Not on file    Outpatient Medications Prior to Visit  Medication Sig Dispense Refill  . calcium carbonate (OSCAL) 1500 (600 Ca) MG TABS tablet Take 1 tablet by mouth daily.    . furosemide (LASIX) 20 MG tablet Take 1 tablet (20 mg total) by mouth daily. 30 tablet 3  . Multiple Vitamins-Minerals (ICAPS AREDS 2 PO) Take 1 tablet by mouth in the morning and at bedtime.    Marland Kitchen levothyroxine (SYNTHROID) 112 MCG tablet TAKE ONE TABLET BY MOUTH ONCE DAILY 90 tablet 1  . lisinopril (ZESTRIL) 20 MG tablet Take 1 tablet (20 mg total) by mouth daily. 90 tablet 1   No facility-administered medications prior to visit.    No Known Allergies  Review of Systems  Constitutional:  Negative for fever.  HENT:  Negative for congestion, sinus pain and sore throat.   Respiratory:  Negative for cough, shortness of breath and wheezing.   Cardiovascular:  Negative for chest pain and palpitations.  Gastrointestinal:  Negative for abdominal pain, constipation, diarrhea, nausea and vomiting.  Genitourinary:  Negative for dysuria, frequency and hematuria.  Musculoskeletal:  Positive for falls.       (-)new muscle pain (-)new joint pain  Skin:        (-)New moles  Neurological:  Negative for headaches.       Objective:    Physical Exam Vitals and nursing note reviewed.  Constitutional:      General: She is not in acute distress.    Appearance: Normal appearance. She is not ill-appearing.  HENT:     Head: Normocephalic and atraumatic.     Right Ear: Tympanic membrane, ear canal and external ear normal.     Left Ear: Tympanic membrane, ear canal and external ear normal.  Eyes:     Extraocular Movements: Extraocular movements intact.     Pupils: Pupils are equal, round, and reactive to light.  Cardiovascular:     Rate and Rhythm:  Normal rate and regular rhythm.     Heart sounds: Normal heart sounds. No murmur heard.    No gallop.  Pulmonary:     Effort: Pulmonary effort is normal. No respiratory distress.     Breath sounds: Normal breath sounds. No wheezing or rales.  Abdominal:     General: Bowel sounds are normal. There is no distension.     Palpations: Abdomen is soft.     Tenderness: There is no abdominal tenderness. There is no guarding.  Musculoskeletal:  Comments: Patient is in wheelchair for ambulatory assistance  Skin:    General: Skin is warm and dry.  Neurological:     Mental Status: She is alert and oriented to person, place, and time.  Psychiatric:        Judgment: Judgment normal.    BP 120/68 (BP Location: Left Arm, Patient Position: Sitting, Cuff Size: Small)   Pulse 71   Temp 98.2 F (36.8 C) (Oral)   Resp 16   Ht 5' 3"$  (1.6 m)   Wt 108 lb 3.2 oz (49.1 kg)   SpO2 96%   BMI 19.17 kg/m  Wt Readings from Last 3 Encounters:  01/01/23 108 lb 3.2 oz (49.1 kg)  12/23/22 108 lb (49 kg)  08/14/22 108 lb (49 kg)       Assessment & Plan:  Hypothyroidism, unspecified type -     Levothyroxine Sodium; Take 1 tablet (112 mcg total) by mouth daily.  Dispense: 90 tablet; Refill: 1  Essential hypertension -     Lisinopril; Take 1 tablet (20 mg total) by mouth daily.  Dispense: 90 tablet; Refill: 1    I, Shehryar Baig, personally preformed the services described in this documentation.  All medical record entries made by the scribe were at my direction and in my presence.  I have reviewed the chart and discharge instructions (if applicable) and agree that the record reflects my personal performance and is accurate and complete. 01/01/2023   I,Shehryar Baig,acting as a scribe for Ann Held, DO.,have documented all relevant documentation on the behalf of Ann Held, DO,as directed by  Ann Held, DO while in the presence of Ann Held, DO.   Shehryar  [employer]

## 2023-01-04 ENCOUNTER — Telehealth: Payer: Self-pay | Admitting: Family Medicine

## 2023-01-04 DIAGNOSIS — E785 Hyperlipidemia, unspecified: Secondary | ICD-10-CM | POA: Diagnosis not present

## 2023-01-04 DIAGNOSIS — H9190 Unspecified hearing loss, unspecified ear: Secondary | ICD-10-CM | POA: Diagnosis not present

## 2023-01-04 DIAGNOSIS — M810 Age-related osteoporosis without current pathological fracture: Secondary | ICD-10-CM | POA: Diagnosis not present

## 2023-01-04 DIAGNOSIS — Z791 Long term (current) use of non-steroidal anti-inflammatories (NSAID): Secondary | ICD-10-CM | POA: Diagnosis not present

## 2023-01-04 DIAGNOSIS — F0280 Dementia in other diseases classified elsewhere without behavioral disturbance: Secondary | ICD-10-CM | POA: Diagnosis not present

## 2023-01-04 DIAGNOSIS — I10 Essential (primary) hypertension: Secondary | ICD-10-CM | POA: Diagnosis not present

## 2023-01-04 DIAGNOSIS — M1990 Unspecified osteoarthritis, unspecified site: Secondary | ICD-10-CM | POA: Diagnosis not present

## 2023-01-04 DIAGNOSIS — Z556 Problems related to health literacy: Secondary | ICD-10-CM | POA: Diagnosis not present

## 2023-01-04 DIAGNOSIS — E039 Hypothyroidism, unspecified: Secondary | ICD-10-CM | POA: Diagnosis not present

## 2023-01-04 NOTE — Telephone Encounter (Signed)
Verbal given 

## 2023-01-04 NOTE — Telephone Encounter (Signed)
Caller/Agency: [facility] Number: [phone] Requesting OT/PT/Skilled Nursing/Social Work/Speech Therapy: PT  Frequency: effective today 01/04/23, 1x1, 2x2, 1x3

## 2023-01-09 DIAGNOSIS — M810 Age-related osteoporosis without current pathological fracture: Secondary | ICD-10-CM | POA: Diagnosis not present

## 2023-01-09 DIAGNOSIS — E039 Hypothyroidism, unspecified: Secondary | ICD-10-CM | POA: Diagnosis not present

## 2023-01-09 DIAGNOSIS — E785 Hyperlipidemia, unspecified: Secondary | ICD-10-CM | POA: Diagnosis not present

## 2023-01-09 DIAGNOSIS — M1990 Unspecified osteoarthritis, unspecified site: Secondary | ICD-10-CM | POA: Diagnosis not present

## 2023-01-09 DIAGNOSIS — Z556 Problems related to health literacy: Secondary | ICD-10-CM | POA: Diagnosis not present

## 2023-01-09 DIAGNOSIS — F0280 Dementia in other diseases classified elsewhere without behavioral disturbance: Secondary | ICD-10-CM | POA: Diagnosis not present

## 2023-01-09 DIAGNOSIS — H9190 Unspecified hearing loss, unspecified ear: Secondary | ICD-10-CM | POA: Diagnosis not present

## 2023-01-09 DIAGNOSIS — I10 Essential (primary) hypertension: Secondary | ICD-10-CM | POA: Diagnosis not present

## 2023-01-09 DIAGNOSIS — Z791 Long term (current) use of non-steroidal anti-inflammatories (NSAID): Secondary | ICD-10-CM | POA: Diagnosis not present

## 2023-01-11 DIAGNOSIS — E785 Hyperlipidemia, unspecified: Secondary | ICD-10-CM | POA: Diagnosis not present

## 2023-01-11 DIAGNOSIS — M1990 Unspecified osteoarthritis, unspecified site: Secondary | ICD-10-CM | POA: Diagnosis not present

## 2023-01-11 DIAGNOSIS — H9190 Unspecified hearing loss, unspecified ear: Secondary | ICD-10-CM | POA: Diagnosis not present

## 2023-01-11 DIAGNOSIS — E039 Hypothyroidism, unspecified: Secondary | ICD-10-CM | POA: Diagnosis not present

## 2023-01-11 DIAGNOSIS — Z791 Long term (current) use of non-steroidal anti-inflammatories (NSAID): Secondary | ICD-10-CM | POA: Diagnosis not present

## 2023-01-11 DIAGNOSIS — I10 Essential (primary) hypertension: Secondary | ICD-10-CM | POA: Diagnosis not present

## 2023-01-11 DIAGNOSIS — F0280 Dementia in other diseases classified elsewhere without behavioral disturbance: Secondary | ICD-10-CM | POA: Diagnosis not present

## 2023-01-11 DIAGNOSIS — Z556 Problems related to health literacy: Secondary | ICD-10-CM | POA: Diagnosis not present

## 2023-01-11 DIAGNOSIS — M810 Age-related osteoporosis without current pathological fracture: Secondary | ICD-10-CM | POA: Diagnosis not present

## 2023-01-14 DIAGNOSIS — E039 Hypothyroidism, unspecified: Secondary | ICD-10-CM | POA: Diagnosis not present

## 2023-01-14 DIAGNOSIS — E785 Hyperlipidemia, unspecified: Secondary | ICD-10-CM | POA: Diagnosis not present

## 2023-01-14 DIAGNOSIS — M810 Age-related osteoporosis without current pathological fracture: Secondary | ICD-10-CM | POA: Diagnosis not present

## 2023-01-14 DIAGNOSIS — H9190 Unspecified hearing loss, unspecified ear: Secondary | ICD-10-CM | POA: Diagnosis not present

## 2023-01-14 DIAGNOSIS — M1990 Unspecified osteoarthritis, unspecified site: Secondary | ICD-10-CM | POA: Diagnosis not present

## 2023-01-14 DIAGNOSIS — F0280 Dementia in other diseases classified elsewhere without behavioral disturbance: Secondary | ICD-10-CM | POA: Diagnosis not present

## 2023-01-14 DIAGNOSIS — Z556 Problems related to health literacy: Secondary | ICD-10-CM | POA: Diagnosis not present

## 2023-01-14 DIAGNOSIS — I10 Essential (primary) hypertension: Secondary | ICD-10-CM | POA: Diagnosis not present

## 2023-01-14 DIAGNOSIS — Z791 Long term (current) use of non-steroidal anti-inflammatories (NSAID): Secondary | ICD-10-CM | POA: Diagnosis not present

## 2023-01-16 DIAGNOSIS — M810 Age-related osteoporosis without current pathological fracture: Secondary | ICD-10-CM | POA: Diagnosis not present

## 2023-01-16 DIAGNOSIS — E039 Hypothyroidism, unspecified: Secondary | ICD-10-CM | POA: Diagnosis not present

## 2023-01-16 DIAGNOSIS — H9190 Unspecified hearing loss, unspecified ear: Secondary | ICD-10-CM | POA: Diagnosis not present

## 2023-01-16 DIAGNOSIS — F0280 Dementia in other diseases classified elsewhere without behavioral disturbance: Secondary | ICD-10-CM | POA: Diagnosis not present

## 2023-01-16 DIAGNOSIS — I10 Essential (primary) hypertension: Secondary | ICD-10-CM | POA: Diagnosis not present

## 2023-01-16 DIAGNOSIS — E785 Hyperlipidemia, unspecified: Secondary | ICD-10-CM | POA: Diagnosis not present

## 2023-01-16 DIAGNOSIS — Z791 Long term (current) use of non-steroidal anti-inflammatories (NSAID): Secondary | ICD-10-CM | POA: Diagnosis not present

## 2023-01-16 DIAGNOSIS — M1990 Unspecified osteoarthritis, unspecified site: Secondary | ICD-10-CM | POA: Diagnosis not present

## 2023-01-16 DIAGNOSIS — Z556 Problems related to health literacy: Secondary | ICD-10-CM | POA: Diagnosis not present

## 2023-01-21 DIAGNOSIS — H9190 Unspecified hearing loss, unspecified ear: Secondary | ICD-10-CM | POA: Diagnosis not present

## 2023-01-21 DIAGNOSIS — Z791 Long term (current) use of non-steroidal anti-inflammatories (NSAID): Secondary | ICD-10-CM | POA: Diagnosis not present

## 2023-01-21 DIAGNOSIS — M810 Age-related osteoporosis without current pathological fracture: Secondary | ICD-10-CM | POA: Diagnosis not present

## 2023-01-21 DIAGNOSIS — E039 Hypothyroidism, unspecified: Secondary | ICD-10-CM | POA: Diagnosis not present

## 2023-01-21 DIAGNOSIS — Z556 Problems related to health literacy: Secondary | ICD-10-CM | POA: Diagnosis not present

## 2023-01-21 DIAGNOSIS — E785 Hyperlipidemia, unspecified: Secondary | ICD-10-CM | POA: Diagnosis not present

## 2023-01-21 DIAGNOSIS — M1990 Unspecified osteoarthritis, unspecified site: Secondary | ICD-10-CM | POA: Diagnosis not present

## 2023-01-21 DIAGNOSIS — I10 Essential (primary) hypertension: Secondary | ICD-10-CM | POA: Diagnosis not present

## 2023-01-21 DIAGNOSIS — F0280 Dementia in other diseases classified elsewhere without behavioral disturbance: Secondary | ICD-10-CM | POA: Diagnosis not present

## 2023-01-22 DIAGNOSIS — F0280 Dementia in other diseases classified elsewhere without behavioral disturbance: Secondary | ICD-10-CM | POA: Diagnosis not present

## 2023-01-22 DIAGNOSIS — I10 Essential (primary) hypertension: Secondary | ICD-10-CM | POA: Diagnosis not present

## 2023-01-22 DIAGNOSIS — E785 Hyperlipidemia, unspecified: Secondary | ICD-10-CM | POA: Diagnosis not present

## 2023-01-22 DIAGNOSIS — Z556 Problems related to health literacy: Secondary | ICD-10-CM | POA: Diagnosis not present

## 2023-01-22 DIAGNOSIS — E039 Hypothyroidism, unspecified: Secondary | ICD-10-CM | POA: Diagnosis not present

## 2023-01-22 DIAGNOSIS — M810 Age-related osteoporosis without current pathological fracture: Secondary | ICD-10-CM | POA: Diagnosis not present

## 2023-01-22 DIAGNOSIS — M1990 Unspecified osteoarthritis, unspecified site: Secondary | ICD-10-CM | POA: Diagnosis not present

## 2023-01-22 DIAGNOSIS — Z791 Long term (current) use of non-steroidal anti-inflammatories (NSAID): Secondary | ICD-10-CM | POA: Diagnosis not present

## 2023-01-22 DIAGNOSIS — H9190 Unspecified hearing loss, unspecified ear: Secondary | ICD-10-CM | POA: Diagnosis not present

## 2023-01-29 ENCOUNTER — Encounter: Payer: Self-pay | Admitting: Podiatry

## 2023-01-29 ENCOUNTER — Ambulatory Visit: Payer: Medicare Other | Admitting: Podiatry

## 2023-01-29 VITALS — BP 131/59

## 2023-01-29 DIAGNOSIS — B351 Tinea unguium: Secondary | ICD-10-CM

## 2023-01-29 DIAGNOSIS — M79674 Pain in right toe(s): Secondary | ICD-10-CM

## 2023-01-29 DIAGNOSIS — M79675 Pain in left toe(s): Secondary | ICD-10-CM | POA: Diagnosis not present

## 2023-01-29 NOTE — Progress Notes (Unsigned)
  Subjective:  Patient ID: [name] [name], female    DOB: October 29, 1927,  MRN: [id number]  [name] [name] presents to clinic today for {jgcomplaint:23593}  Chief Complaint  Patient presents with   Nail Problem    [facility] PCP-[facility] [place] PCP VST-12/2022   New problem(s): None. {jgcomplaint:23593}  PCP is Carollee Herter, Alferd Apa, DO.  No Known Allergies  Review of Systems: Negative except as noted in the HPI.  Objective: No changes noted in today's physical examination. Vitals:   01/29/23 1326  BP: (!) 131/59   [name] [name] is a pleasant 89 y.o. female {jgbodyhabitus:24098} AAO x 3.  Vascular CFT immediate b/l LE. Palpable DP/PT pulses b/l LE. Digital hair sparse b/l. Skin temperature gradient WNL b/l. No pain with calf compression b/l. No edema noted b/l. No cyanosis or clubbing noted b/l LE.  Neurologic Normal speech. Oriented to person, place, and time. Protective sensation intact 5/5 intact bilaterally with 10g monofilament b/l.  Dermatologic Pedal integument with normal turgor, texture and tone b/l LE. No open wounds b/l. No interdigital macerations b/l.   Toenails 1-5 b/l elongated, thickened, discolored with subungual debris. +Tenderness with dorsal palpation of nailplates.   Preulcerative lesion noted submet head 1 bilaterally. There is visible subdermal hemorrhage. There is no surrounding erythema, no edema, no drainage, no odor, no fluctuance.   Orthopedic: Muscle strength 5/5 to all lower extremity muscle groups bilaterally. HAV with bunion deformity noted b/l LE. Utilizes rollator for ambulation assistance.   Assessment/Plan: No diagnosis found.  No orders of the defined types were placed in this encounter.   None {Jgplan:23602::"-Patient/POA to call should there be question/concern in the interim."}   Return in about 3 months (around 05/01/2023).  Marzetta Board, DPM

## 2023-01-30 DIAGNOSIS — H9190 Unspecified hearing loss, unspecified ear: Secondary | ICD-10-CM | POA: Diagnosis not present

## 2023-01-30 DIAGNOSIS — E039 Hypothyroidism, unspecified: Secondary | ICD-10-CM | POA: Diagnosis not present

## 2023-01-30 DIAGNOSIS — Z791 Long term (current) use of non-steroidal anti-inflammatories (NSAID): Secondary | ICD-10-CM | POA: Diagnosis not present

## 2023-01-30 DIAGNOSIS — I10 Essential (primary) hypertension: Secondary | ICD-10-CM | POA: Diagnosis not present

## 2023-01-30 DIAGNOSIS — M810 Age-related osteoporosis without current pathological fracture: Secondary | ICD-10-CM | POA: Diagnosis not present

## 2023-01-30 DIAGNOSIS — E785 Hyperlipidemia, unspecified: Secondary | ICD-10-CM | POA: Diagnosis not present

## 2023-01-30 DIAGNOSIS — F0280 Dementia in other diseases classified elsewhere without behavioral disturbance: Secondary | ICD-10-CM | POA: Diagnosis not present

## 2023-01-30 DIAGNOSIS — M1990 Unspecified osteoarthritis, unspecified site: Secondary | ICD-10-CM | POA: Diagnosis not present

## 2023-01-30 DIAGNOSIS — Z556 Problems related to health literacy: Secondary | ICD-10-CM | POA: Diagnosis not present

## 2023-01-31 ENCOUNTER — Telehealth: Payer: Self-pay | Admitting: Family Medicine

## 2023-01-31 NOTE — Telephone Encounter (Signed)
Caller/Agency: Mickel Baas ([place]) Callback Number: [phone] Requesting OT/PT/Skilled Nursing/Social Work/Speech Therapy: PT Frequency: 2 w 4, start week of 3.25.24

## 2023-02-01 NOTE — Telephone Encounter (Signed)
Verbal given 

## 2023-02-07 DIAGNOSIS — I10 Essential (primary) hypertension: Secondary | ICD-10-CM | POA: Diagnosis not present

## 2023-02-07 DIAGNOSIS — Z791 Long term (current) use of non-steroidal anti-inflammatories (NSAID): Secondary | ICD-10-CM | POA: Diagnosis not present

## 2023-02-07 DIAGNOSIS — E039 Hypothyroidism, unspecified: Secondary | ICD-10-CM | POA: Diagnosis not present

## 2023-02-07 DIAGNOSIS — M810 Age-related osteoporosis without current pathological fracture: Secondary | ICD-10-CM | POA: Diagnosis not present

## 2023-02-07 DIAGNOSIS — H9190 Unspecified hearing loss, unspecified ear: Secondary | ICD-10-CM | POA: Diagnosis not present

## 2023-02-07 DIAGNOSIS — M1990 Unspecified osteoarthritis, unspecified site: Secondary | ICD-10-CM | POA: Diagnosis not present

## 2023-02-07 DIAGNOSIS — Z556 Problems related to health literacy: Secondary | ICD-10-CM | POA: Diagnosis not present

## 2023-02-07 DIAGNOSIS — E785 Hyperlipidemia, unspecified: Secondary | ICD-10-CM | POA: Diagnosis not present

## 2023-02-07 DIAGNOSIS — F0280 Dementia in other diseases classified elsewhere without behavioral disturbance: Secondary | ICD-10-CM | POA: Diagnosis not present

## 2023-02-11 DIAGNOSIS — H9190 Unspecified hearing loss, unspecified ear: Secondary | ICD-10-CM | POA: Diagnosis not present

## 2023-02-11 DIAGNOSIS — M810 Age-related osteoporosis without current pathological fracture: Secondary | ICD-10-CM | POA: Diagnosis not present

## 2023-02-11 DIAGNOSIS — I10 Essential (primary) hypertension: Secondary | ICD-10-CM | POA: Diagnosis not present

## 2023-02-11 DIAGNOSIS — Z556 Problems related to health literacy: Secondary | ICD-10-CM | POA: Diagnosis not present

## 2023-02-11 DIAGNOSIS — E785 Hyperlipidemia, unspecified: Secondary | ICD-10-CM | POA: Diagnosis not present

## 2023-02-11 DIAGNOSIS — Z791 Long term (current) use of non-steroidal anti-inflammatories (NSAID): Secondary | ICD-10-CM | POA: Diagnosis not present

## 2023-02-11 DIAGNOSIS — M1990 Unspecified osteoarthritis, unspecified site: Secondary | ICD-10-CM | POA: Diagnosis not present

## 2023-02-11 DIAGNOSIS — F0280 Dementia in other diseases classified elsewhere without behavioral disturbance: Secondary | ICD-10-CM | POA: Diagnosis not present

## 2023-02-11 DIAGNOSIS — E039 Hypothyroidism, unspecified: Secondary | ICD-10-CM | POA: Diagnosis not present

## 2023-02-13 DIAGNOSIS — M810 Age-related osteoporosis without current pathological fracture: Secondary | ICD-10-CM | POA: Diagnosis not present

## 2023-02-13 DIAGNOSIS — H9190 Unspecified hearing loss, unspecified ear: Secondary | ICD-10-CM | POA: Diagnosis not present

## 2023-02-13 DIAGNOSIS — E039 Hypothyroidism, unspecified: Secondary | ICD-10-CM | POA: Diagnosis not present

## 2023-02-13 DIAGNOSIS — I10 Essential (primary) hypertension: Secondary | ICD-10-CM | POA: Diagnosis not present

## 2023-02-13 DIAGNOSIS — F0280 Dementia in other diseases classified elsewhere without behavioral disturbance: Secondary | ICD-10-CM | POA: Diagnosis not present

## 2023-02-13 DIAGNOSIS — Z791 Long term (current) use of non-steroidal anti-inflammatories (NSAID): Secondary | ICD-10-CM | POA: Diagnosis not present

## 2023-02-13 DIAGNOSIS — E785 Hyperlipidemia, unspecified: Secondary | ICD-10-CM | POA: Diagnosis not present

## 2023-02-13 DIAGNOSIS — M1990 Unspecified osteoarthritis, unspecified site: Secondary | ICD-10-CM | POA: Diagnosis not present

## 2023-02-13 DIAGNOSIS — Z556 Problems related to health literacy: Secondary | ICD-10-CM | POA: Diagnosis not present

## 2023-02-18 DIAGNOSIS — I10 Essential (primary) hypertension: Secondary | ICD-10-CM

## 2023-02-18 DIAGNOSIS — H9190 Unspecified hearing loss, unspecified ear: Secondary | ICD-10-CM

## 2023-02-18 DIAGNOSIS — M1990 Unspecified osteoarthritis, unspecified site: Secondary | ICD-10-CM

## 2023-02-18 DIAGNOSIS — Z791 Long term (current) use of non-steroidal anti-inflammatories (NSAID): Secondary | ICD-10-CM

## 2023-02-18 DIAGNOSIS — E039 Hypothyroidism, unspecified: Secondary | ICD-10-CM

## 2023-02-18 DIAGNOSIS — Z556 Problems related to health literacy: Secondary | ICD-10-CM

## 2023-02-18 DIAGNOSIS — F0280 Dementia in other diseases classified elsewhere without behavioral disturbance: Secondary | ICD-10-CM

## 2023-02-18 DIAGNOSIS — E785 Hyperlipidemia, unspecified: Secondary | ICD-10-CM

## 2023-02-18 DIAGNOSIS — M180 Bilateral primary osteoarthritis of first carpometacarpal joints: Secondary | ICD-10-CM

## 2023-02-20 ENCOUNTER — Encounter: Payer: Self-pay | Admitting: Family Medicine

## 2023-02-21 DIAGNOSIS — F0280 Dementia in other diseases classified elsewhere without behavioral disturbance: Secondary | ICD-10-CM | POA: Diagnosis not present

## 2023-02-21 DIAGNOSIS — E785 Hyperlipidemia, unspecified: Secondary | ICD-10-CM | POA: Diagnosis not present

## 2023-02-21 DIAGNOSIS — M1990 Unspecified osteoarthritis, unspecified site: Secondary | ICD-10-CM | POA: Diagnosis not present

## 2023-02-21 DIAGNOSIS — Z791 Long term (current) use of non-steroidal anti-inflammatories (NSAID): Secondary | ICD-10-CM | POA: Diagnosis not present

## 2023-02-21 DIAGNOSIS — I10 Essential (primary) hypertension: Secondary | ICD-10-CM | POA: Diagnosis not present

## 2023-02-21 DIAGNOSIS — Z556 Problems related to health literacy: Secondary | ICD-10-CM | POA: Diagnosis not present

## 2023-02-21 DIAGNOSIS — M810 Age-related osteoporosis without current pathological fracture: Secondary | ICD-10-CM | POA: Diagnosis not present

## 2023-02-21 DIAGNOSIS — E039 Hypothyroidism, unspecified: Secondary | ICD-10-CM | POA: Diagnosis not present

## 2023-02-21 DIAGNOSIS — H9190 Unspecified hearing loss, unspecified ear: Secondary | ICD-10-CM | POA: Diagnosis not present

## 2023-02-22 MED ORDER — SERTRALINE HCL 25 MG PO TABS: 25.0000 mg | ORAL_TABLET | Freq: Every day | ORAL | 2 refills | Status: DC

## 2023-02-22 MED ORDER — QUETIAPINE FUMARATE 25 MG PO TABS: 25.0000 mg | ORAL_TABLET | Freq: Every day | ORAL | 2 refills | Status: DC

## 2023-03-01 DIAGNOSIS — M1990 Unspecified osteoarthritis, unspecified site: Secondary | ICD-10-CM | POA: Diagnosis not present

## 2023-03-01 DIAGNOSIS — Z791 Long term (current) use of non-steroidal anti-inflammatories (NSAID): Secondary | ICD-10-CM | POA: Diagnosis not present

## 2023-03-01 DIAGNOSIS — F0280 Dementia in other diseases classified elsewhere without behavioral disturbance: Secondary | ICD-10-CM | POA: Diagnosis not present

## 2023-03-01 DIAGNOSIS — I10 Essential (primary) hypertension: Secondary | ICD-10-CM | POA: Diagnosis not present

## 2023-03-01 DIAGNOSIS — H9190 Unspecified hearing loss, unspecified ear: Secondary | ICD-10-CM | POA: Diagnosis not present

## 2023-03-01 DIAGNOSIS — Z556 Problems related to health literacy: Secondary | ICD-10-CM | POA: Diagnosis not present

## 2023-03-01 DIAGNOSIS — E785 Hyperlipidemia, unspecified: Secondary | ICD-10-CM | POA: Diagnosis not present

## 2023-03-01 DIAGNOSIS — E039 Hypothyroidism, unspecified: Secondary | ICD-10-CM | POA: Diagnosis not present

## 2023-03-01 DIAGNOSIS — M810 Age-related osteoporosis without current pathological fracture: Secondary | ICD-10-CM | POA: Diagnosis not present

## 2023-03-08 DIAGNOSIS — M1990 Unspecified osteoarthritis, unspecified site: Secondary | ICD-10-CM | POA: Diagnosis not present

## 2023-03-08 DIAGNOSIS — Z556 Problems related to health literacy: Secondary | ICD-10-CM | POA: Diagnosis not present

## 2023-03-08 DIAGNOSIS — H9190 Unspecified hearing loss, unspecified ear: Secondary | ICD-10-CM | POA: Diagnosis not present

## 2023-03-08 DIAGNOSIS — I10 Essential (primary) hypertension: Secondary | ICD-10-CM | POA: Diagnosis not present

## 2023-03-08 DIAGNOSIS — E785 Hyperlipidemia, unspecified: Secondary | ICD-10-CM | POA: Diagnosis not present

## 2023-03-08 DIAGNOSIS — M810 Age-related osteoporosis without current pathological fracture: Secondary | ICD-10-CM | POA: Diagnosis not present

## 2023-03-08 DIAGNOSIS — E039 Hypothyroidism, unspecified: Secondary | ICD-10-CM | POA: Diagnosis not present

## 2023-03-08 DIAGNOSIS — F0280 Dementia in other diseases classified elsewhere without behavioral disturbance: Secondary | ICD-10-CM | POA: Diagnosis not present

## 2023-03-12 DIAGNOSIS — M810 Age-related osteoporosis without current pathological fracture: Secondary | ICD-10-CM | POA: Diagnosis not present

## 2023-03-12 DIAGNOSIS — E039 Hypothyroidism, unspecified: Secondary | ICD-10-CM | POA: Diagnosis not present

## 2023-03-12 DIAGNOSIS — I10 Essential (primary) hypertension: Secondary | ICD-10-CM | POA: Diagnosis not present

## 2023-03-12 DIAGNOSIS — M1990 Unspecified osteoarthritis, unspecified site: Secondary | ICD-10-CM | POA: Diagnosis not present

## 2023-03-12 DIAGNOSIS — E785 Hyperlipidemia, unspecified: Secondary | ICD-10-CM | POA: Diagnosis not present

## 2023-03-12 DIAGNOSIS — Z556 Problems related to health literacy: Secondary | ICD-10-CM | POA: Diagnosis not present

## 2023-03-12 DIAGNOSIS — F0280 Dementia in other diseases classified elsewhere without behavioral disturbance: Secondary | ICD-10-CM | POA: Diagnosis not present

## 2023-03-12 DIAGNOSIS — H9190 Unspecified hearing loss, unspecified ear: Secondary | ICD-10-CM | POA: Diagnosis not present

## 2023-03-14 DIAGNOSIS — M1990 Unspecified osteoarthritis, unspecified site: Secondary | ICD-10-CM | POA: Diagnosis not present

## 2023-03-14 DIAGNOSIS — Z556 Problems related to health literacy: Secondary | ICD-10-CM | POA: Diagnosis not present

## 2023-03-14 DIAGNOSIS — F0280 Dementia in other diseases classified elsewhere without behavioral disturbance: Secondary | ICD-10-CM | POA: Diagnosis not present

## 2023-03-14 DIAGNOSIS — H9190 Unspecified hearing loss, unspecified ear: Secondary | ICD-10-CM | POA: Diagnosis not present

## 2023-03-14 DIAGNOSIS — I10 Essential (primary) hypertension: Secondary | ICD-10-CM | POA: Diagnosis not present

## 2023-03-14 DIAGNOSIS — E039 Hypothyroidism, unspecified: Secondary | ICD-10-CM | POA: Diagnosis not present

## 2023-03-14 DIAGNOSIS — M810 Age-related osteoporosis without current pathological fracture: Secondary | ICD-10-CM | POA: Diagnosis not present

## 2023-03-14 DIAGNOSIS — E785 Hyperlipidemia, unspecified: Secondary | ICD-10-CM | POA: Diagnosis not present

## 2023-03-19 DIAGNOSIS — E039 Hypothyroidism, unspecified: Secondary | ICD-10-CM | POA: Diagnosis not present

## 2023-03-19 DIAGNOSIS — Z556 Problems related to health literacy: Secondary | ICD-10-CM | POA: Diagnosis not present

## 2023-03-19 DIAGNOSIS — M1990 Unspecified osteoarthritis, unspecified site: Secondary | ICD-10-CM | POA: Diagnosis not present

## 2023-03-19 DIAGNOSIS — M810 Age-related osteoporosis without current pathological fracture: Secondary | ICD-10-CM | POA: Diagnosis not present

## 2023-03-19 DIAGNOSIS — F0280 Dementia in other diseases classified elsewhere without behavioral disturbance: Secondary | ICD-10-CM | POA: Diagnosis not present

## 2023-03-19 DIAGNOSIS — H9190 Unspecified hearing loss, unspecified ear: Secondary | ICD-10-CM | POA: Diagnosis not present

## 2023-03-19 DIAGNOSIS — I10 Essential (primary) hypertension: Secondary | ICD-10-CM | POA: Diagnosis not present

## 2023-03-19 DIAGNOSIS — E785 Hyperlipidemia, unspecified: Secondary | ICD-10-CM | POA: Diagnosis not present

## 2023-03-22 DIAGNOSIS — F0280 Dementia in other diseases classified elsewhere without behavioral disturbance: Secondary | ICD-10-CM | POA: Diagnosis not present

## 2023-03-22 DIAGNOSIS — E039 Hypothyroidism, unspecified: Secondary | ICD-10-CM | POA: Diagnosis not present

## 2023-03-22 DIAGNOSIS — H9190 Unspecified hearing loss, unspecified ear: Secondary | ICD-10-CM | POA: Diagnosis not present

## 2023-03-22 DIAGNOSIS — M810 Age-related osteoporosis without current pathological fracture: Secondary | ICD-10-CM | POA: Diagnosis not present

## 2023-03-22 DIAGNOSIS — Z556 Problems related to health literacy: Secondary | ICD-10-CM | POA: Diagnosis not present

## 2023-03-22 DIAGNOSIS — I10 Essential (primary) hypertension: Secondary | ICD-10-CM | POA: Diagnosis not present

## 2023-03-22 DIAGNOSIS — E785 Hyperlipidemia, unspecified: Secondary | ICD-10-CM | POA: Diagnosis not present

## 2023-03-22 DIAGNOSIS — M1990 Unspecified osteoarthritis, unspecified site: Secondary | ICD-10-CM | POA: Diagnosis not present

## 2023-03-26 DIAGNOSIS — M1990 Unspecified osteoarthritis, unspecified site: Secondary | ICD-10-CM | POA: Diagnosis not present

## 2023-03-26 DIAGNOSIS — Z556 Problems related to health literacy: Secondary | ICD-10-CM | POA: Diagnosis not present

## 2023-03-26 DIAGNOSIS — E785 Hyperlipidemia, unspecified: Secondary | ICD-10-CM | POA: Diagnosis not present

## 2023-03-26 DIAGNOSIS — I10 Essential (primary) hypertension: Secondary | ICD-10-CM | POA: Diagnosis not present

## 2023-03-26 DIAGNOSIS — H9190 Unspecified hearing loss, unspecified ear: Secondary | ICD-10-CM | POA: Diagnosis not present

## 2023-03-26 DIAGNOSIS — M810 Age-related osteoporosis without current pathological fracture: Secondary | ICD-10-CM | POA: Diagnosis not present

## 2023-03-26 DIAGNOSIS — F0280 Dementia in other diseases classified elsewhere without behavioral disturbance: Secondary | ICD-10-CM | POA: Diagnosis not present

## 2023-03-26 DIAGNOSIS — E039 Hypothyroidism, unspecified: Secondary | ICD-10-CM | POA: Diagnosis not present

## 2023-03-28 DIAGNOSIS — E785 Hyperlipidemia, unspecified: Secondary | ICD-10-CM | POA: Diagnosis not present

## 2023-03-28 DIAGNOSIS — I10 Essential (primary) hypertension: Secondary | ICD-10-CM | POA: Diagnosis not present

## 2023-03-28 DIAGNOSIS — M1990 Unspecified osteoarthritis, unspecified site: Secondary | ICD-10-CM | POA: Diagnosis not present

## 2023-03-28 DIAGNOSIS — M810 Age-related osteoporosis without current pathological fracture: Secondary | ICD-10-CM | POA: Diagnosis not present

## 2023-03-28 DIAGNOSIS — F0280 Dementia in other diseases classified elsewhere without behavioral disturbance: Secondary | ICD-10-CM | POA: Diagnosis not present

## 2023-03-28 DIAGNOSIS — Z556 Problems related to health literacy: Secondary | ICD-10-CM | POA: Diagnosis not present

## 2023-03-28 DIAGNOSIS — E039 Hypothyroidism, unspecified: Secondary | ICD-10-CM | POA: Diagnosis not present

## 2023-03-28 DIAGNOSIS — H9190 Unspecified hearing loss, unspecified ear: Secondary | ICD-10-CM | POA: Diagnosis not present

## 2023-04-03 ENCOUNTER — Ambulatory Visit (INDEPENDENT_AMBULATORY_CARE_PROVIDER_SITE_OTHER): Payer: Medicare Other | Admitting: Podiatry

## 2023-04-03 DIAGNOSIS — R410 Disorientation, unspecified: Secondary | ICD-10-CM | POA: Diagnosis not present

## 2023-04-03 DIAGNOSIS — M79675 Pain in left toe(s): Secondary | ICD-10-CM | POA: Diagnosis not present

## 2023-04-03 DIAGNOSIS — W19XXXA Unspecified fall, initial encounter: Secondary | ICD-10-CM | POA: Diagnosis not present

## 2023-04-03 DIAGNOSIS — M79671 Pain in right foot: Secondary | ICD-10-CM

## 2023-04-03 DIAGNOSIS — M79674 Pain in right toe(s): Secondary | ICD-10-CM

## 2023-04-03 DIAGNOSIS — L84 Corns and callosities: Secondary | ICD-10-CM

## 2023-04-03 DIAGNOSIS — B351 Tinea unguium: Secondary | ICD-10-CM

## 2023-04-03 NOTE — Progress Notes (Signed)
  Subjective:  Patient ID: [name] [name], female    DOB: December 07, 1926,  MRN: [id number]  [name] [name] presents to clinic today for callus(es) both feet and painful thick toenails that are difficult to trim. Painful toenails interfere with ambulation. Aggravating factors include wearing enclosed shoe gear. Pain is relieved with periodic professional debridement. Painful calluses are aggravated when weightbearing with and without shoegear. Pain is relieved with periodic professional debridement.  Chief Complaint  Patient presents with   Nail Problem    [facility] PCP-[facility] PCP VST-11/2022   New problem(s): None.   PCP is Zola Button, Florala R, DO.  No Known Allergies  Review of Systems: Negative except as noted in the HPI.  Objective:  There were no vitals filed for this visit. [name] [name] is a pleasant 89 y.o. female WD, WN in NAD. AAO x 3.  Vascular CFT immediate b/l LE. Palpable DP/PT pulses b/l LE. Digital hair sparse b/l. Skin temperature gradient WNL b/l. No pain with calf compression b/l. No edema noted b/l. No cyanosis or clubbing noted b/l LE.  Neurologic Normal speech. Oriented to person, place, and time. Protective sensation intact 5/5 intact bilaterally with 10g monofilament b/l.  Dermatologic Pedal integument with normal turgor, texture and tone b/l LE. No open wounds b/l. No interdigital macerations b/l.   Toenails 1-5 b/l elongated, thickened, discolored with subungual debris. +Tenderness with dorsal palpation of nailplates.   Minimal lesion noted submet head 1 bilaterally. There is visible subdermal hemorrhage. There is no surrounding erythema, no edema, no drainage, no odor, no fluctuance.   Hyperkeratotic lesion left 5th digit PIPJ dorsally.  Orthopedic: Muscle strength 5/5 to all lower extremity muscle groups bilaterally. HAV with bunion deformity noted b/l LE. Hammertoe 5th digit b/l. Utilizes rollator for ambulation assistance.   Assessment/Plan: 1. Pain due to  onychomycosis of toenails of both feet     -Patient's family member present. All questions/concerns addressed on today's visit. -Consent given for treatment as described below: -Examined patient. -Toenails 1-5 b/l were debrided in length and girth with sterile nail nippers and dremel without iatrogenic bleeding.  -As a courtesy, corn(s) left fifth digit pared utilizing sterile scalpel blade without complication or incident. Total number pared=1. -Patient/POA to call should there be question/concern in the interim.   Return in about 9 weeks (around 06/05/2023).  [name], DPM

## 2023-04-04 DIAGNOSIS — E785 Hyperlipidemia, unspecified: Secondary | ICD-10-CM | POA: Diagnosis not present

## 2023-04-04 DIAGNOSIS — Z556 Problems related to health literacy: Secondary | ICD-10-CM | POA: Diagnosis not present

## 2023-04-04 DIAGNOSIS — M810 Age-related osteoporosis without current pathological fracture: Secondary | ICD-10-CM | POA: Diagnosis not present

## 2023-04-04 DIAGNOSIS — H9190 Unspecified hearing loss, unspecified ear: Secondary | ICD-10-CM | POA: Diagnosis not present

## 2023-04-04 DIAGNOSIS — I10 Essential (primary) hypertension: Secondary | ICD-10-CM | POA: Diagnosis not present

## 2023-04-04 DIAGNOSIS — E039 Hypothyroidism, unspecified: Secondary | ICD-10-CM | POA: Diagnosis not present

## 2023-04-04 DIAGNOSIS — M1990 Unspecified osteoarthritis, unspecified site: Secondary | ICD-10-CM | POA: Diagnosis not present

## 2023-04-04 DIAGNOSIS — F0280 Dementia in other diseases classified elsewhere without behavioral disturbance: Secondary | ICD-10-CM | POA: Diagnosis not present

## 2023-04-07 ENCOUNTER — Encounter (HOSPITAL_BASED_OUTPATIENT_CLINIC_OR_DEPARTMENT_OTHER): Payer: Self-pay | Admitting: Emergency Medicine

## 2023-04-07 ENCOUNTER — Emergency Department (HOSPITAL_BASED_OUTPATIENT_CLINIC_OR_DEPARTMENT_OTHER): Payer: Medicare Other

## 2023-04-07 ENCOUNTER — Emergency Department (HOSPITAL_BASED_OUTPATIENT_CLINIC_OR_DEPARTMENT_OTHER)
Admission: EM | Admit: 2023-04-07 | Discharge: 2023-04-07 | Disposition: A | Discharge status: Home / Self Care | Payer: Medicare Other | Attending: Emergency Medicine | Admitting: Emergency Medicine

## 2023-04-07 ENCOUNTER — Encounter: Payer: Self-pay | Admitting: Podiatry

## 2023-04-07 DIAGNOSIS — S0083XA Contusion of other part of head, initial encounter: Secondary | ICD-10-CM | POA: Insufficient documentation

## 2023-04-07 DIAGNOSIS — R5381 Other malaise: Secondary | ICD-10-CM | POA: Diagnosis not present

## 2023-04-07 DIAGNOSIS — S0081XA Abrasion of other part of head, initial encounter: Secondary | ICD-10-CM | POA: Diagnosis not present

## 2023-04-07 DIAGNOSIS — S199XXA Unspecified injury of neck, initial encounter: Secondary | ICD-10-CM | POA: Diagnosis not present

## 2023-04-07 DIAGNOSIS — S0003XA Contusion of scalp, initial encounter: Secondary | ICD-10-CM | POA: Diagnosis not present

## 2023-04-07 DIAGNOSIS — Z23 Encounter for immunization: Secondary | ICD-10-CM | POA: Insufficient documentation

## 2023-04-07 DIAGNOSIS — I10 Essential (primary) hypertension: Secondary | ICD-10-CM | POA: Diagnosis not present

## 2023-04-07 DIAGNOSIS — S0990XA Unspecified injury of head, initial encounter: Secondary | ICD-10-CM | POA: Diagnosis not present

## 2023-04-07 DIAGNOSIS — W19XXXA Unspecified fall, initial encounter: Secondary | ICD-10-CM | POA: Diagnosis not present

## 2023-04-07 DIAGNOSIS — T148XXA Other injury of unspecified body region, initial encounter: Secondary | ICD-10-CM | POA: Diagnosis not present

## 2023-04-07 DIAGNOSIS — Z79899 Other long term (current) drug therapy: Secondary | ICD-10-CM | POA: Diagnosis not present

## 2023-04-07 DIAGNOSIS — E039 Hypothyroidism, unspecified: Secondary | ICD-10-CM | POA: Diagnosis not present

## 2023-04-07 MED ORDER — TETANUS-DIPHTH-ACELL PERTUSSIS 5-2.5-18.5 LF-MCG/0.5 IM SUSY
0.5000 mL | PREFILLED_SYRINGE | Freq: Once | INTRAMUSCULAR | Status: AC
  Administered 2023-04-07: 0.5 mL via INTRAMUSCULAR
  Filled 2023-04-07: qty 0.5

## 2023-04-07 NOTE — ED Triage Notes (Signed)
Pt reports she grabbed on to a jewelry armoire and it fell on top her, hitting her head; hematoma noted; denies LOC, no thinners; denies other injuries

## 2023-04-07 NOTE — ED Notes (Signed)
Patient transported to CT 

## 2023-04-07 NOTE — ED Notes (Signed)
Left area of head cleaned with normal saline. Antibiotic cream applied, 2x2 applied and secured with paper tape.

## 2023-04-07 NOTE — ED Notes (Signed)
Pt denies dizziness at this time.

## 2023-04-07 NOTE — ED Provider Notes (Signed)
[facility] Provider Note   CSN: [id number] Arrival date & time: 04/07/23  1113     History  Chief Complaint  Patient presents with   Marletta Lor    [name] [name] is a 89 y.o. female.  HPI     89 year old female with a history of hypertension, hypothyroidism, presents with concern for fall with head trauma.  She was walking in her home without her rollator, and placed her hand on a jewelry armoire but then began to fall, causing her to fall with her left falling on her head.  She denies loss of consciousness, significant headache, denies other injuries including no neck pain, back pain, chest pain, abdominal pain, hip pain, other leg or arm pain.  Reports she is otherwise been in her normal state of health, without numbness, weakness, dizziness, nausea, vomiting, shortness of breath, cough, fever or other symptoms.  Her husband of 75 years died a month ago.  She also had a fall on Wednesday and Friday which were bother also mechanical-leaned over to pick something up and lost balance.  She has a life alert and is supposed to walk with her rollator, but often does not wear her life alert or walk with her rollator.  Past Medical History:  Diagnosis Date   Arthritis    "right thumb" (09/05/2015)   Hyperlipidemia    Hypertension    Hypothyroidism    Osteoporosis      Home Medications Prior to Admission medications   Medication Sig Start Date End Date Taking? Authorizing Provider  calcium carbonate (OSCAL) 1500 (600 Ca) MG TABS tablet Take 1 tablet by mouth daily.    [provider]  furosemide (LASIX) 20 MG tablet Take 1 tablet (20 mg total) by mouth daily. 06/25/22   Donato Schultz, DO  levothyroxine (SYNTHROID) 112 MCG tablet Take 1 tablet (112 mcg total) by mouth daily. 01/01/23   Seabron Spates R, DO  lisinopril (ZESTRIL) 20 MG tablet Take 1 tablet (20 mg total) by mouth daily. 01/01/23   Donato Schultz, DO   Multiple Vitamins-Minerals (ICAPS AREDS 2 PO) Take 1 tablet by mouth in the morning and at bedtime.    [provider]  QUEtiapine (SEROQUEL) 25 MG tablet Take 1 tablet (25 mg total) by mouth at bedtime. 02/22/23   Donato Schultz, DO  sertraline (ZOLOFT) 25 MG tablet Take 1 tablet (25 mg total) by mouth daily. 02/22/23   Donato Schultz, DO      Allergies    Patient has no known allergies.    Review of Systems   Review of Systems  Physical Exam Updated Vital Signs BP (!) 119/51   Pulse 70   Temp 98.2 F (36.8 C)   Resp 18   Ht 5\' 3"  (1.6 m)   Wt 49 kg   SpO2 99%   BMI 19.13 kg/m  Physical Exam Vitals and nursing note reviewed.  Constitutional:      General: She is not in acute distress.    Appearance: She is well-developed. She is not diaphoretic.  HENT:     Head: Normocephalic.     Comments: Hematoma left fronta, overlying abrasion Eyes:     Conjunctiva/sclera: Conjunctivae normal.  Cardiovascular:     Rate and Rhythm: Normal rate and regular rhythm.     Heart sounds: Normal heart sounds. No murmur heard.    No friction rub. No gallop.  Pulmonary:  Effort: Pulmonary effort is normal. No respiratory distress.     Breath sounds: Normal breath sounds. No wheezing or rales.  Abdominal:     General: There is no distension.     Palpations: Abdomen is soft.     Tenderness: There is no abdominal tenderness. There is no guarding.  Musculoskeletal:        General: No tenderness.     Cervical back: Normal range of motion.     Comments: Older contusion thoracic spine (yellow in color now), no tenderness to c/t/l spine, lower extremities   Skin:    General: Skin is warm and dry.     Findings: No erythema or rash.  Neurological:     Mental Status: She is alert and oriented to person, place, and time.     Sensory: No sensory deficit.     Motor: No weakness.     ED Results / Procedures / Treatments   Labs (all labs ordered are listed, but only  abnormal results are displayed) Labs Reviewed - No data to display  EKG None  Radiology CT Head Wo Contrast  Result Date: 04/07/2023 CLINICAL DATA:  Furniture fell on top of patient hitting her on head. EXAM: CT HEAD WITHOUT CONTRAST CT CERVICAL SPINE WITHOUT CONTRAST TECHNIQUE: Multidetector CT imaging of the head and cervical spine was performed following the standard protocol without intravenous contrast. Multiplanar CT image reconstructions of the cervical spine were also generated. RADIATION DOSE REDUCTION: This exam was performed according to the departmental dose-optimization program which includes automated exposure control, adjustment of the mA and/or kV according to patient size and/or use of iterative reconstruction technique. COMPARISON:  12/23/2022 FINDINGS: CT HEAD FINDINGS Brain: No evidence of acute infarction, hemorrhage, hydrocephalus, extra-axial collection or mass lesion/mass effect. There is mild diffuse low-attenuation within the subcortical and periventricular white matter compatible with chronic microvascular disease. Prominence of the sulci and ventricles compatible with brain atrophy. Vascular: No hyperdense vessel or unexpected calcification. Skull: Normal. Negative for fracture or focal lesion. Sinuses/Orbits: No acute finding. Other: Left frontotemporal scalp hematoma measures 6 mm in thickness. No underlying fracture. CT CERVICAL SPINE FINDINGS Alignment: No acute posttraumatic malalignment. Skull base and vertebrae: No acute fracture. No primary bone lesion or focal pathologic process. Soft tissues and spinal canal: No prevertebral fluid or swelling. No visible canal hematoma. Disc levels: Moderate to marked disc space narrowing and endplate spurring noted at C3-4, C4-5, C5-6 and C6-7. Bilateral facet arthropathy noted. Upper chest: Negative. Other: None IMPRESSION: 1. No acute intracranial abnormality. 2. Chronic microvascular disease and brain atrophy. 3. Left frontotemporal  scalp hematoma without underlying fracture. 4. No evidence for cervical spine fracture or subluxation. 5. Advanced cervical degenerative disc disease and facet arthropathy. Electronically Signed   By: Signa Kell M.D.   On: 04/07/2023 12:47   CT Cervical Spine Wo Contrast  Result Date: 04/07/2023 CLINICAL DATA:  Furniture fell on top of patient hitting her on head. EXAM: CT HEAD WITHOUT CONTRAST CT CERVICAL SPINE WITHOUT CONTRAST TECHNIQUE: Multidetector CT imaging of the head and cervical spine was performed following the standard protocol without intravenous contrast. Multiplanar CT image reconstructions of the cervical spine were also generated. RADIATION DOSE REDUCTION: This exam was performed according to the departmental dose-optimization program which includes automated exposure control, adjustment of the mA and/or kV according to patient size and/or use of iterative reconstruction technique. COMPARISON:  12/23/2022 FINDINGS: CT HEAD FINDINGS Brain: No evidence of acute infarction, hemorrhage, hydrocephalus, extra-axial collection or mass lesion/mass effect.  There is mild diffuse low-attenuation within the subcortical and periventricular white matter compatible with chronic microvascular disease. Prominence of the sulci and ventricles compatible with brain atrophy. Vascular: No hyperdense vessel or unexpected calcification. Skull: Normal. Negative for fracture or focal lesion. Sinuses/Orbits: No acute finding. Other: Left frontotemporal scalp hematoma measures 6 mm in thickness. No underlying fracture. CT CERVICAL SPINE FINDINGS Alignment: No acute posttraumatic malalignment. Skull base and vertebrae: No acute fracture. No primary bone lesion or focal pathologic process. Soft tissues and spinal canal: No prevertebral fluid or swelling. No visible canal hematoma. Disc levels: Moderate to marked disc space narrowing and endplate spurring noted at C3-4, C4-5, C5-6 and C6-7. Bilateral facet arthropathy  noted. Upper chest: Negative. Other: None IMPRESSION: 1. No acute intracranial abnormality. 2. Chronic microvascular disease and brain atrophy. 3. Left frontotemporal scalp hematoma without underlying fracture. 4. No evidence for cervical spine fracture or subluxation. 5. Advanced cervical degenerative disc disease and facet arthropathy. Electronically Signed   By: Signa Kell M.D.   On: 04/07/2023 12:47    Procedures Procedures    Medications Ordered in ED Medications  Tdap (BOOSTRIX) injection 0.5 mL (0.5 mLs Intramuscular Given 04/07/23 1236)    ED Course/ Medical Decision Making/ A&P                              89 year old female with a history of hypertension, hypothyroidism, presents with concern for fall with head trauma.   Her fall was mechanical and denies other acute medical concerns.  Has signs of head trauma, given this and age, ordered CT head and cervical spine for further evaluation.  She is noted to have an older/healing contusion to the back, but does not have tenderness over this area, low suspicion for other fracture or injury by history and exam.  CT head completed and personally abided by me and radiology shows no evidence of intracranial hemorrhage or fracture.  Tdap was updated given small abrasion.  Emphasized importance of using her rollator, carrying life alert.         Final Clinical Impression(s) / ED Diagnoses Final diagnoses:  Fall, initial encounter  Contusion of face, initial encounter  Abrasion    Rx / DC Orders ED Discharge Orders     None         Alvira Monday, MD 04/07/23 2242

## 2023-04-16 DIAGNOSIS — M810 Age-related osteoporosis without current pathological fracture: Secondary | ICD-10-CM | POA: Diagnosis not present

## 2023-04-16 DIAGNOSIS — E785 Hyperlipidemia, unspecified: Secondary | ICD-10-CM | POA: Diagnosis not present

## 2023-04-16 DIAGNOSIS — M1990 Unspecified osteoarthritis, unspecified site: Secondary | ICD-10-CM | POA: Diagnosis not present

## 2023-04-16 DIAGNOSIS — E039 Hypothyroidism, unspecified: Secondary | ICD-10-CM | POA: Diagnosis not present

## 2023-04-16 DIAGNOSIS — Z556 Problems related to health literacy: Secondary | ICD-10-CM | POA: Diagnosis not present

## 2023-04-16 DIAGNOSIS — H9190 Unspecified hearing loss, unspecified ear: Secondary | ICD-10-CM | POA: Diagnosis not present

## 2023-04-16 DIAGNOSIS — I10 Essential (primary) hypertension: Secondary | ICD-10-CM | POA: Diagnosis not present

## 2023-04-16 DIAGNOSIS — F0280 Dementia in other diseases classified elsewhere without behavioral disturbance: Secondary | ICD-10-CM | POA: Diagnosis not present

## 2023-04-23 ENCOUNTER — Telehealth: Payer: Self-pay | Admitting: Family Medicine

## 2023-04-23 DIAGNOSIS — E785 Hyperlipidemia, unspecified: Secondary | ICD-10-CM | POA: Diagnosis not present

## 2023-04-23 DIAGNOSIS — M810 Age-related osteoporosis without current pathological fracture: Secondary | ICD-10-CM | POA: Diagnosis not present

## 2023-04-23 DIAGNOSIS — E039 Hypothyroidism, unspecified: Secondary | ICD-10-CM | POA: Diagnosis not present

## 2023-04-23 DIAGNOSIS — F0280 Dementia in other diseases classified elsewhere without behavioral disturbance: Secondary | ICD-10-CM | POA: Diagnosis not present

## 2023-04-23 DIAGNOSIS — H9190 Unspecified hearing loss, unspecified ear: Secondary | ICD-10-CM | POA: Diagnosis not present

## 2023-04-23 DIAGNOSIS — I10 Essential (primary) hypertension: Secondary | ICD-10-CM | POA: Diagnosis not present

## 2023-04-23 DIAGNOSIS — Z556 Problems related to health literacy: Secondary | ICD-10-CM | POA: Diagnosis not present

## 2023-04-23 DIAGNOSIS — M1990 Unspecified osteoarthritis, unspecified site: Secondary | ICD-10-CM | POA: Diagnosis not present

## 2023-04-23 NOTE — Telephone Encounter (Signed)
Galina with [facility] called to report a fall. Pt fell yesterday but her memory is not so good so she couldn't get a lot of information from her. Pt has a little abrasion on her elbow and it's sore. She did not have pt do arm exercises today.

## 2023-04-24 NOTE — Telephone Encounter (Signed)
Just an FYI

## 2023-05-01 DIAGNOSIS — F0280 Dementia in other diseases classified elsewhere without behavioral disturbance: Secondary | ICD-10-CM | POA: Diagnosis not present

## 2023-05-01 DIAGNOSIS — Z556 Problems related to health literacy: Secondary | ICD-10-CM | POA: Diagnosis not present

## 2023-05-01 DIAGNOSIS — M1990 Unspecified osteoarthritis, unspecified site: Secondary | ICD-10-CM | POA: Diagnosis not present

## 2023-05-01 DIAGNOSIS — E785 Hyperlipidemia, unspecified: Secondary | ICD-10-CM | POA: Diagnosis not present

## 2023-05-01 DIAGNOSIS — I10 Essential (primary) hypertension: Secondary | ICD-10-CM | POA: Diagnosis not present

## 2023-05-01 DIAGNOSIS — E039 Hypothyroidism, unspecified: Secondary | ICD-10-CM | POA: Diagnosis not present

## 2023-05-01 DIAGNOSIS — M810 Age-related osteoporosis without current pathological fracture: Secondary | ICD-10-CM | POA: Diagnosis not present

## 2023-05-01 DIAGNOSIS — H9190 Unspecified hearing loss, unspecified ear: Secondary | ICD-10-CM | POA: Diagnosis not present

## 2023-05-02 ENCOUNTER — Telehealth: Payer: Self-pay | Admitting: Family Medicine

## 2023-05-02 NOTE — Telephone Encounter (Signed)
[name] [name] [facility]) called stating the following:  Pt had another fall on 6.18.24. Pt walked to kitchen w/o walker and fell. Life alert was activated and EMS responded. Pt did not have any injuries and did not hit her head.   Pt has been d/c from PT due to being caught up on PT. Pt is mainly having issues with cognitive decisions with her movement in regards to safety and was recommended 24 hour supervision or being admitted to an assisted living facility. Pt and family decided against recommendations at this time.

## 2023-05-03 ENCOUNTER — Encounter: Payer: Self-pay | Admitting: Family Medicine

## 2023-05-03 ENCOUNTER — Ambulatory Visit (HOSPITAL_BASED_OUTPATIENT_CLINIC_OR_DEPARTMENT_OTHER)
Admission: RE | Admit: 2023-05-03 | Discharge: 2023-05-03 | Disposition: A | Discharge status: Home / Self Care | Payer: Medicare Other | Source: Ambulatory Visit | Attending: Family Medicine | Admitting: Family Medicine

## 2023-05-03 ENCOUNTER — Ambulatory Visit (INDEPENDENT_AMBULATORY_CARE_PROVIDER_SITE_OTHER): Payer: Medicare Other | Admitting: Family Medicine

## 2023-05-03 VITALS — BP 120/70 | HR 74 | Temp 98.4°F | Resp 18 | Ht 63.0 in | Wt 110.8 lb

## 2023-05-03 DIAGNOSIS — M79601 Pain in right arm: Secondary | ICD-10-CM | POA: Insufficient documentation

## 2023-05-03 DIAGNOSIS — D229 Melanocytic nevi, unspecified: Secondary | ICD-10-CM | POA: Diagnosis not present

## 2023-05-03 DIAGNOSIS — R296 Repeated falls: Secondary | ICD-10-CM | POA: Diagnosis not present

## 2023-05-03 DIAGNOSIS — R42 Dizziness and giddiness: Secondary | ICD-10-CM | POA: Diagnosis not present

## 2023-05-03 LAB — CBC WITH DIFFERENTIAL/PLATELET
Basophils Absolute: 0 10*3/uL (ref 0.0–0.1)
Basophils Relative: 0.5 % (ref 0.0–3.0)
Eosinophils Absolute: 0.1 10*3/uL (ref 0.0–0.7)
Eosinophils Relative: 1.1 % (ref 0.0–5.0)
HCT: 43.6 % (ref 36.0–46.0)
Hemoglobin: 14.4 g/dL (ref 12.0–15.0)
Lymphocytes Relative: 10.7 % — ABNORMAL LOW (ref 12.0–46.0)
Lymphs Abs: 1 10*3/uL (ref 0.7–4.0)
MCHC: 33.1 g/dL (ref 30.0–36.0)
MCV: 94.1 fl (ref 78.0–100.0)
Monocytes Absolute: 0.7 10*3/uL (ref 0.1–1.0)
Monocytes Relative: 8.2 % (ref 3.0–12.0)
Neutro Abs: 7.1 10*3/uL (ref 1.4–7.7)
Neutrophils Relative %: 79.5 % — ABNORMAL HIGH (ref 43.0–77.0)
Platelets: 182 10*3/uL (ref 150.0–400.0)
RBC: 4.63 Mil/uL (ref 3.87–5.11)
RDW: 14.2 % (ref 11.5–15.5)
WBC: 8.9 10*3/uL (ref 4.0–10.5)

## 2023-05-03 LAB — COMPREHENSIVE METABOLIC PANEL
ALT: 26 U/L (ref 0–35)
AST: 25 U/L (ref 0–37)
Albumin: 3.9 g/dL (ref 3.5–5.2)
Alkaline Phosphatase: 90 U/L (ref 39–117)
BUN: 22 mg/dL (ref 6–23)
CO2: 31 mEq/L (ref 19–32)
Calcium: 9.3 mg/dL (ref 8.4–10.5)
Chloride: 104 mEq/L (ref 96–112)
Creatinine, Ser: 0.82 mg/dL (ref 0.40–1.20)
GFR: 61.21 mL/min (ref >60.00)
Glucose, Bld: 87 mg/dL (ref 70–99)
Potassium: 4.7 mEq/L (ref 3.5–5.1)
Sodium: 141 mEq/L (ref 135–145)
Total Bilirubin: 0.5 mg/dL (ref 0.2–1.2)
Total Protein: 6.9 g/dL (ref 6.0–8.3)

## 2023-05-03 LAB — POC URINALSYSI DIPSTICK (AUTOMATED)
Bilirubin, UA: NEGATIVE
Blood, UA: NEGATIVE
Glucose, UA: NEGATIVE
Ketones, UA: NEGATIVE
Nitrite, UA: NEGATIVE
Protein, UA: NEGATIVE
Spec Grav, UA: 1.02 (ref 1.010–1.025)
Urobilinogen, UA: 0.2 E.U./dL
pH, UA: 6 (ref 5.0–8.0)

## 2023-05-03 LAB — THYROID PANEL WITH TSH
Free Thyroxine Index: 2.5 (ref 1.4–3.8)
T3 Uptake: 28 % (ref 22–35)
T4, Total: 8.8 ug/dL (ref 5.1–11.9)
TSH: 15.91 mIU/L — ABNORMAL HIGH (ref 0.40–4.50)

## 2023-05-03 LAB — VITAMIN B12: Vitamin B-12: 357 pg/mL (ref 211–911)

## 2023-05-03 NOTE — Progress Notes (Addendum)
Established Patient Office Visit  Subjective   Patient ID: [name] [name], female    DOB: 25-Jun-1927  Age: 89 y.o. MRN: [id number]  Chief Complaint  Patient presents with   Fall    Pt had a fall lask week, pt states having right arm upper pain. Pt has had several falls since last visit     HPI Discussed the use of AI scribe software for clinical note transcription with the patient, who gave verbal consent to proceed.  History of Present Illness   The patient, a 89 year old with a history of hypertension and macular degeneration, presents with dizziness, falls, and a sore arm. The dizziness occurs every morning upon awakening and resolves after eating. The patient has been experiencing this symptom since a fall approximately three weeks ago. The patient has also been falling frequently, often not using her walker. The most recent fall resulted in a sore arm, which has been persistently painful for about three weeks.      Patient Active Problem List   Diagnosis Date Noted   Cellulitis of right lower extremity 06/25/2022   Lower extremity edema 06/25/2022   Bilateral impacted cerumen 01/29/2022   Preventative health care 11/23/2021   Memory loss 11/23/2021   Bruising 09/09/2020   Chronic right-sided low back pain without sciatica 06/03/2020   Acute mid back pain 06/03/2020   Weight decrease 08/04/2018   Anxiety 08/04/2018   Slow transit constipation 08/04/2018   Chest pain 09/05/2015   Hyperlipidemia 09/05/2015   CKD (chronic kidney disease), stage II-III 09/05/2015   Abnormal EKG 09/05/2015   HEMATURIA UNSPECIFIED 11/01/2009   BACK PAIN 11/01/2009   Hypothyroidism 07/11/2008   CERUMEN IMPACTION, BILATERAL 07/01/2008   DIZZINESS 07/01/2008   Essential hypertension 01/15/2008   KNEE PAIN, RIGHT 01/15/2008   Osteoporosis 01/15/2008   TONSILLECTOMY AND ADENOIDECTOMY, HX OF 01/15/2008   Past Medical History:  Diagnosis Date   Arthritis    "right thumb" (09/05/2015)    Hyperlipidemia    Hypertension    Hypothyroidism    Osteoporosis    Past Surgical History:  Procedure Laterality Date   CATARACT EXTRACTION W/ INTRAOCULAR LENS  IMPLANT, BILATERAL Bilateral 2010   CLOSED REDUCTION SHOULDER DISLOCATION Right    OVARIAN CYST SURGERY  1949   TONSILLECTOMY     Social History   Tobacco Use   Smoking status: Never   Smokeless tobacco: Never  Substance Use Topics   Alcohol use: No   Drug use: No   Social History   Socioeconomic History   Marital status: Married    Spouse name: Not on file   Number of children: Not on file   Years of education: Not on file   Highest education level: Not on file  Occupational History   Not on file  Tobacco Use   Smoking status: Never   Smokeless tobacco: Never  Substance and Sexual Activity   Alcohol use: No   Drug use: No   Sexual activity: Yes    Partners: Male  Other Topics Concern   Not on file  Social History Narrative   Lives with her husband in a house    2 children close by to help   Social Determinants of Health   Financial Resource Strain: Low Risk  (03/16/2022)   Overall Financial Resource Strain (CARDIA)    Difficulty of Paying Living Expenses: Not hard at all  Food Insecurity: No Food Insecurity (03/16/2022)   Hunger Vital Sign    Worried About Running Out  of Food in the Last Year: Never true    Ran Out of Food in the Last Year: Never true  Transportation Needs: No Transportation Needs (12/08/2021)   PRAPARE - Administrator, Civil Service (Medical): No    Lack of Transportation (Non-Medical): No  Physical Activity: Insufficiently Active (03/16/2022)   Exercise Vital Sign    Days of Exercise per Week: 3 days    Minutes of Exercise per Session: 10 min  Stress: Stress Concern Present (03/16/2022)   [employer] of Occupational Health - Occupational Stress Questionnaire    Feeling of Stress : Rather much  Social Connections: Not on file  Intimate Partner Violence: Not on file    Family Status  Relation Name Status   Mother  Deceased   Father  Deceased   Brother  Alive       4   Brother  Deceased       1   Sister  Deceased       1   Sister  (Not Specified)   Brother  (Not Specified)   Daughter  (Not Specified)   MGM  Deceased   MGF  Deceased   PGM  Deceased   PGF  Deceased   Family History  Problem Relation Age of Onset   Cancer Mother        bladder   Cancer Father        unknown   Ovarian cancer Sister    Heart disease Brother        All 5 brothers have had heart issues starting in their 11s   Breast cancer Daughter    No Known Allergies    Review of Systems  Constitutional:  Negative for fever and malaise/fatigue.  HENT:  Negative for congestion.   Eyes:  Negative for blurred vision.  Respiratory:  Negative for shortness of breath.   Cardiovascular:  Negative for chest pain, palpitations and leg swelling.  Gastrointestinal:  Negative for abdominal pain, blood in stool and nausea.  Genitourinary:  Negative for dysuria and frequency.  Musculoskeletal:  Positive for falls and joint pain.  Skin:  Negative for rash.  Neurological:  Positive for dizziness. Negative for loss of consciousness and headaches.  Endo/Heme/Allergies:  Negative for environmental allergies.  Psychiatric/Behavioral:  Positive for memory loss. Negative for depression. The patient is not nervous/anxious.       Objective:     BP 120/70 (BP Location: Left Arm, Patient Position: Sitting, Cuff Size: Normal)   Pulse 74   Temp 98.4 F (36.9 C) (Oral)   Resp 18   Ht 5\' 3"  (1.6 m)   Wt 110 lb 12.8 oz (50.3 kg)   SpO2 97%   BMI 19.63 kg/m  BP Readings from Last 3 Encounters:  05/03/23 120/70  04/07/23 (!) 119/51  01/29/23 (!) 131/59   Wt Readings from Last 3 Encounters:  05/03/23 110 lb 12.8 oz (50.3 kg)  04/07/23 108 lb (49 kg)  01/01/23 108 lb 3.2 oz (49.1 kg)   SpO2 Readings from Last 3 Encounters:  05/03/23 97%  04/07/23 99%  01/01/23 96%       Physical Exam Vitals and nursing note reviewed.  Constitutional:      Appearance: She is well-developed.  HENT:     Head: Normocephalic and atraumatic.     Right Ear: Tympanic membrane normal. There is no impacted cerumen.     Left Ear: Tympanic membrane normal. There is no impacted cerumen.  Eyes:  Conjunctiva/sclera: Conjunctivae normal.  Neck:     Thyroid: No thyromegaly.     Vascular: No carotid bruit or JVD.  Cardiovascular:     Rate and Rhythm: Normal rate and regular rhythm.     Heart sounds: Normal heart sounds. No murmur heard. Pulmonary:     Effort: Pulmonary effort is normal. No respiratory distress.     Breath sounds: Normal breath sounds. No wheezing or rales.  Chest:     Chest wall: No tenderness.  Musculoskeletal:        General: Signs of injury present. No swelling or tenderness.     Cervical back: Normal range of motion and neck supple.     Comments: No pain with palpation or r arm / elbow   Skin:    Findings: Lesion present.     Comments: Mult sk on arm s and back  Neurological:     Mental Status: She is alert and oriented to person, place, and time.      No results found for any visits on 05/03/23.  Last CBC Lab Results  Component Value Date   WBC 9.6 12/23/2022   HGB 14.3 12/23/2022   HCT 42.9 12/23/2022   MCV 93.1 12/23/2022   MCH 31.0 12/23/2022   RDW 13.3 12/23/2022   PLT 145 (L) 12/23/2022   Last metabolic panel Lab Results  Component Value Date   GLUCOSE 86 12/23/2022   NA 135 12/23/2022   K 3.9 12/23/2022   CL 103 12/23/2022   CO2 25 12/23/2022   BUN 15 12/23/2022   CREATININE 0.55 12/23/2022   GFRNONAA >60 12/23/2022   CALCIUM 8.9 12/23/2022   PROT 6.6 12/23/2022   ALBUMIN 3.4 (L) 12/23/2022   BILITOT 1.3 (H) 12/23/2022   ALKPHOS 74 12/23/2022   AST 34 12/23/2022   ALT 23 12/23/2022   ANIONGAP 7 12/23/2022   Last lipids Lab Results  Component Value Date   CHOL 194 01/01/2023   HDL 77.60 01/01/2023   LDLCALC 103  (H) 01/01/2023   LDLDIRECT 157.4 01/29/2011   TRIG 64.0 01/01/2023   CHOLHDL 2 01/01/2023   Last hemoglobin A1c Lab Results  Component Value Date   HGBA1C 5.4 04/04/2022   Last thyroid functions Lab Results  Component Value Date   TSH 5.32 01/01/2023   T4TOTAL 12.3 (H) 06/25/2022   Last vitamin D Lab Results  Component Value Date   VD25OH 34 11/02/2010   Last vitamin B12 and Folate Lab Results  Component Value Date   VITAMINB12 506 04/04/2022   FOLATE 15.1 07/01/2008      The ASCVD Risk score (Arnett DK, et al., 2019) failed to calculate for the following reasons:   The 2019 ASCVD risk score is only valid for ages 74 to 65    Assessment & Plan:   Problem List Items Addressed This Visit   None Visit Diagnoses     Pain of right upper extremity    -  Primary   Relevant Orders   DG Elbow Complete Right (Completed)   DG Humerus Right (Completed)   Dizzy       Relevant Orders   CBC with Differential/Platelet   Comprehensive metabolic panel   POCT Urinalysis Dipstick (Automated)   Vitamin B12   Thyroid Panel With TSH   Frequent falls       Relevant Orders   Thyroid Panel With TSH   Suspicious nevus       Relevant Orders   Ambulatory referral to Dermatology  Assessment and Plan    Patient has HTN, CKD and Osteoporosis which is causing the patient to have weakness, unsteady gait and falls. I am ordering HH PT to evaluate the patient to assist.      Assessment and Plan    Falls: Multiple falls, most often without the use of a walker. Discussed the importance of using a walker to prevent falls and potential serious injuries. -Encouraged consistent use of walker.  Right Arm Pain: Pain in the right arm following a fall on concrete. No significant pain with movement or palpation. Possible soft tissue injury. -Order right arm X-ray to rule out fracture.  Morning Dizziness: Reports of dizziness every morning since a fall on May 26th. Dizziness reportedly  improves after eating. -Check blood pressure and blood sugar levels. -Check for urinary tract infection. -Advise patient to move slowly in the morning, sitting on the side of the bed before standing.  Skin Lesions: Noted skin lesions on the arm and lip. Lesions have been present for most of the year and are changing. -Refer to dermatology for evaluation and possible biopsy.  Physical Therapy: Recent completion of home physical therapy. Family considering switching to a different provider. -Consider re-referral for physical therapy in one month.  General Health Maintenance: -Order labs to check thyroid and vitamin levels, and to rule out anemia. -Collect urine sample to rule out urinary tract infection.         No follow-ups on file.    Donato Schultz, DO

## 2023-05-04 LAB — URINE CULTURE
MICRO NUMBER:: 15112495
SPECIMEN QUALITY:: ADEQUATE

## 2023-05-06 ENCOUNTER — Other Ambulatory Visit: Payer: Self-pay | Admitting: Family Medicine

## 2023-05-06 ENCOUNTER — Telehealth: Payer: Self-pay | Admitting: Family Medicine

## 2023-05-06 DIAGNOSIS — E039 Hypothyroidism, unspecified: Secondary | ICD-10-CM

## 2023-05-06 MED ORDER — LEVOTHYROXINE SODIUM 125 MCG PO TABS: 125.0000 ug | ORAL_TABLET | Freq: Every day | ORAL | 1 refills | Status: DC

## 2023-05-06 NOTE — Telephone Encounter (Signed)
Daughter called and states received a message from provider regarding the thyroid medication. Yes patient is currently taking it and please send increase of levothyroxine 125 mg to [facility].

## 2023-05-06 NOTE — Telephone Encounter (Signed)
Rx sent 

## 2023-05-06 NOTE — Addendum Note (Signed)
Addended by: Roxanne Gates on: 05/06/2023 02:55 PM   Modules accepted: Orders

## 2023-05-10 ENCOUNTER — Other Ambulatory Visit: Payer: Self-pay | Admitting: Family Medicine

## 2023-05-13 ENCOUNTER — Other Ambulatory Visit: Payer: Self-pay | Admitting: Family Medicine

## 2023-05-17 ENCOUNTER — Other Ambulatory Visit: Payer: Self-pay | Admitting: Family Medicine

## 2023-05-20 ENCOUNTER — Other Ambulatory Visit: Payer: Self-pay | Admitting: Family Medicine

## 2023-05-23 DIAGNOSIS — L728 Other follicular cysts of the skin and subcutaneous tissue: Secondary | ICD-10-CM | POA: Diagnosis not present

## 2023-05-23 DIAGNOSIS — L821 Other seborrheic keratosis: Secondary | ICD-10-CM | POA: Diagnosis not present

## 2023-05-23 DIAGNOSIS — D492 Neoplasm of unspecified behavior of bone, soft tissue, and skin: Secondary | ICD-10-CM | POA: Diagnosis not present

## 2023-05-23 DIAGNOSIS — L570 Actinic keratosis: Secondary | ICD-10-CM | POA: Diagnosis not present

## 2023-05-23 DIAGNOSIS — C44311 Basal cell carcinoma of skin of nose: Secondary | ICD-10-CM | POA: Diagnosis not present

## 2023-06-03 ENCOUNTER — Other Ambulatory Visit: Payer: Self-pay | Admitting: Family Medicine

## 2023-06-03 ENCOUNTER — Encounter: Payer: Self-pay | Admitting: Family Medicine

## 2023-06-03 DIAGNOSIS — R296 Repeated falls: Secondary | ICD-10-CM

## 2023-06-03 DIAGNOSIS — R413 Other amnesia: Secondary | ICD-10-CM

## 2023-06-03 NOTE — Telephone Encounter (Signed)
Okay to place referral

## 2023-06-12 ENCOUNTER — Other Ambulatory Visit: Payer: Self-pay | Admitting: Family Medicine

## 2023-06-12 ENCOUNTER — Encounter (INDEPENDENT_AMBULATORY_CARE_PROVIDER_SITE_OTHER): Payer: Self-pay

## 2023-06-12 ENCOUNTER — Ambulatory Visit: Payer: Medicare Other | Admitting: Podiatry

## 2023-06-12 DIAGNOSIS — M79671 Pain in right foot: Secondary | ICD-10-CM

## 2023-06-12 DIAGNOSIS — M79675 Pain in left toe(s): Secondary | ICD-10-CM | POA: Diagnosis not present

## 2023-06-12 DIAGNOSIS — B351 Tinea unguium: Secondary | ICD-10-CM | POA: Diagnosis not present

## 2023-06-12 DIAGNOSIS — M79674 Pain in right toe(s): Secondary | ICD-10-CM

## 2023-06-12 DIAGNOSIS — L84 Corns and callosities: Secondary | ICD-10-CM

## 2023-06-12 NOTE — Progress Notes (Signed)
  Subjective:  Patient ID: [name] [name], female    DOB: 1927/09/02,  MRN: [id number]  [name] [name] presents to clinic today for painful elongated mycotic toenails 1-5 bilaterally which are tender when wearing enclosed shoe gear. Pain is relieved with periodic professional debridement. She is accompanied by her daughters on today's visit. Chief Complaint  Patient presents with   Nail Problem    rfc   New problem(s): None.   PCP is Zola Button, Rachel R, DO.  No Known Allergies  Review of Systems: Negative except as noted in the HPI.  Objective: No changes noted in today's physical examination. There were no vitals filed for this visit. [name] [name] is a pleasant 89 y.o. female thin build in NAD. AAO x 3.  Vascular Examination: Capillary refill time immediate b/l. Vascular status intact b/l with palpable pedal pulses. Pedal hair present b/l. No pain with calf compression b/l. Skin temperature gradient WNL b/l. No cyanosis or clubbing b/l. No ischemia or gangrene noted b/l.   Neurological Examination: Sensation grossly intact b/l with 10 gram monofilament. Vibratory sensation intact b/l.   Dermatological Examination: Pedal skin with normal turgor, texture and tone b/l.  No open wounds. No interdigital macerations.   Toenails 1-5 b/l thick, discolored, elongated with subungual debris and pain on dorsal palpation.   Porokeratotic lesion submet head 1 bilaterally. There is no surrounding erythema, no edema, no drainage, no odor, no fluctuance.   Musculoskeletal Examination: Muscle strength 5/5 to all lower extremity muscle groups bilaterally. HAV with bunion deformity noted b/l LE. Utilizes rollator for ambulation assistance.  Radiographs: None  Assessment/Plan: 1. Pain due to onychomycosis of toenails of both feet     -Consent given for treatment as described below: -Examined patient. -Mycotic toenails 1-5 bilaterally were debrided in length and girth with sterile nail  nippers and dremel without incident. -As a courtesy, porokeratotic lesion(s) submet head 1 left foot pared and enucleated without complication or incident. Total number pared=1. -Patient/POA to call should there be question/concern in the interim.   Return in about 3 months (around 09/12/2023).  [name], DPM

## 2023-06-16 ENCOUNTER — Encounter: Payer: Self-pay | Admitting: Podiatry

## 2023-06-18 DIAGNOSIS — T1490XA Injury, unspecified, initial encounter: Secondary | ICD-10-CM | POA: Diagnosis not present

## 2023-06-18 DIAGNOSIS — R5381 Other malaise: Secondary | ICD-10-CM | POA: Diagnosis not present

## 2023-06-18 DIAGNOSIS — W19XXXA Unspecified fall, initial encounter: Secondary | ICD-10-CM | POA: Diagnosis not present

## 2023-06-21 ENCOUNTER — Other Ambulatory Visit: Payer: Self-pay | Admitting: Family Medicine

## 2023-06-24 DIAGNOSIS — I129 Hypertensive chronic kidney disease with stage 1 through stage 4 chronic kidney disease, or unspecified chronic kidney disease: Secondary | ICD-10-CM | POA: Diagnosis not present

## 2023-06-24 DIAGNOSIS — H3530 Unspecified macular degeneration: Secondary | ICD-10-CM | POA: Diagnosis not present

## 2023-06-24 DIAGNOSIS — K5901 Slow transit constipation: Secondary | ICD-10-CM | POA: Diagnosis not present

## 2023-06-24 DIAGNOSIS — G8929 Other chronic pain: Secondary | ICD-10-CM | POA: Diagnosis not present

## 2023-06-24 DIAGNOSIS — N183 Chronic kidney disease, stage 3 unspecified: Secondary | ICD-10-CM | POA: Diagnosis not present

## 2023-06-24 DIAGNOSIS — E785 Hyperlipidemia, unspecified: Secondary | ICD-10-CM | POA: Diagnosis not present

## 2023-06-24 DIAGNOSIS — E039 Hypothyroidism, unspecified: Secondary | ICD-10-CM | POA: Diagnosis not present

## 2023-06-24 DIAGNOSIS — M545 Low back pain, unspecified: Secondary | ICD-10-CM | POA: Diagnosis not present

## 2023-06-24 DIAGNOSIS — F0280 Dementia in other diseases classified elsewhere without behavioral disturbance: Secondary | ICD-10-CM | POA: Diagnosis not present

## 2023-06-24 DIAGNOSIS — M1909 Primary osteoarthritis, other specified site: Secondary | ICD-10-CM | POA: Diagnosis not present

## 2023-06-24 DIAGNOSIS — H6123 Impacted cerumen, bilateral: Secondary | ICD-10-CM | POA: Diagnosis not present

## 2023-06-24 DIAGNOSIS — M810 Age-related osteoporosis without current pathological fracture: Secondary | ICD-10-CM | POA: Diagnosis not present

## 2023-06-24 DIAGNOSIS — F419 Anxiety disorder, unspecified: Secondary | ICD-10-CM | POA: Diagnosis not present

## 2023-06-24 DIAGNOSIS — H9190 Unspecified hearing loss, unspecified ear: Secondary | ICD-10-CM | POA: Diagnosis not present

## 2023-06-24 DIAGNOSIS — R296 Repeated falls: Secondary | ICD-10-CM | POA: Diagnosis not present

## 2023-06-27 DIAGNOSIS — I129 Hypertensive chronic kidney disease with stage 1 through stage 4 chronic kidney disease, or unspecified chronic kidney disease: Secondary | ICD-10-CM | POA: Diagnosis not present

## 2023-06-27 DIAGNOSIS — K5901 Slow transit constipation: Secondary | ICD-10-CM | POA: Diagnosis not present

## 2023-06-27 DIAGNOSIS — E785 Hyperlipidemia, unspecified: Secondary | ICD-10-CM | POA: Diagnosis not present

## 2023-06-27 DIAGNOSIS — H6123 Impacted cerumen, bilateral: Secondary | ICD-10-CM | POA: Diagnosis not present

## 2023-06-27 DIAGNOSIS — M1909 Primary osteoarthritis, other specified site: Secondary | ICD-10-CM | POA: Diagnosis not present

## 2023-06-27 DIAGNOSIS — R296 Repeated falls: Secondary | ICD-10-CM | POA: Diagnosis not present

## 2023-06-27 DIAGNOSIS — N183 Chronic kidney disease, stage 3 unspecified: Secondary | ICD-10-CM | POA: Diagnosis not present

## 2023-06-27 DIAGNOSIS — G8929 Other chronic pain: Secondary | ICD-10-CM | POA: Diagnosis not present

## 2023-06-27 DIAGNOSIS — H9190 Unspecified hearing loss, unspecified ear: Secondary | ICD-10-CM | POA: Diagnosis not present

## 2023-06-27 DIAGNOSIS — E039 Hypothyroidism, unspecified: Secondary | ICD-10-CM | POA: Diagnosis not present

## 2023-06-27 DIAGNOSIS — F0280 Dementia in other diseases classified elsewhere without behavioral disturbance: Secondary | ICD-10-CM | POA: Diagnosis not present

## 2023-06-27 DIAGNOSIS — M545 Low back pain, unspecified: Secondary | ICD-10-CM | POA: Diagnosis not present

## 2023-06-27 DIAGNOSIS — F419 Anxiety disorder, unspecified: Secondary | ICD-10-CM | POA: Diagnosis not present

## 2023-06-27 DIAGNOSIS — M810 Age-related osteoporosis without current pathological fracture: Secondary | ICD-10-CM | POA: Diagnosis not present

## 2023-06-27 DIAGNOSIS — H3530 Unspecified macular degeneration: Secondary | ICD-10-CM | POA: Diagnosis not present

## 2023-07-01 DIAGNOSIS — F419 Anxiety disorder, unspecified: Secondary | ICD-10-CM | POA: Diagnosis not present

## 2023-07-01 DIAGNOSIS — M810 Age-related osteoporosis without current pathological fracture: Secondary | ICD-10-CM | POA: Diagnosis not present

## 2023-07-01 DIAGNOSIS — I129 Hypertensive chronic kidney disease with stage 1 through stage 4 chronic kidney disease, or unspecified chronic kidney disease: Secondary | ICD-10-CM | POA: Diagnosis not present

## 2023-07-01 DIAGNOSIS — G8929 Other chronic pain: Secondary | ICD-10-CM | POA: Diagnosis not present

## 2023-07-01 DIAGNOSIS — H6123 Impacted cerumen, bilateral: Secondary | ICD-10-CM | POA: Diagnosis not present

## 2023-07-01 DIAGNOSIS — E039 Hypothyroidism, unspecified: Secondary | ICD-10-CM | POA: Diagnosis not present

## 2023-07-01 DIAGNOSIS — H3530 Unspecified macular degeneration: Secondary | ICD-10-CM | POA: Diagnosis not present

## 2023-07-01 DIAGNOSIS — F0280 Dementia in other diseases classified elsewhere without behavioral disturbance: Secondary | ICD-10-CM | POA: Diagnosis not present

## 2023-07-01 DIAGNOSIS — N183 Chronic kidney disease, stage 3 unspecified: Secondary | ICD-10-CM | POA: Diagnosis not present

## 2023-07-01 DIAGNOSIS — H9190 Unspecified hearing loss, unspecified ear: Secondary | ICD-10-CM | POA: Diagnosis not present

## 2023-07-01 DIAGNOSIS — R296 Repeated falls: Secondary | ICD-10-CM | POA: Diagnosis not present

## 2023-07-01 DIAGNOSIS — M545 Low back pain, unspecified: Secondary | ICD-10-CM | POA: Diagnosis not present

## 2023-07-01 DIAGNOSIS — E785 Hyperlipidemia, unspecified: Secondary | ICD-10-CM | POA: Diagnosis not present

## 2023-07-01 DIAGNOSIS — K5901 Slow transit constipation: Secondary | ICD-10-CM | POA: Diagnosis not present

## 2023-07-01 DIAGNOSIS — M1909 Primary osteoarthritis, other specified site: Secondary | ICD-10-CM | POA: Diagnosis not present

## 2023-07-02 ENCOUNTER — Ambulatory Visit: Payer: Medicare Other | Admitting: Family Medicine

## 2023-07-02 ENCOUNTER — Encounter: Payer: Self-pay | Admitting: Family Medicine

## 2023-07-02 VITALS — BP 110/60 | HR 81 | Temp 98.6°F | Resp 18 | Ht 63.0 in | Wt 109.8 lb

## 2023-07-02 DIAGNOSIS — F419 Anxiety disorder, unspecified: Secondary | ICD-10-CM | POA: Diagnosis not present

## 2023-07-02 DIAGNOSIS — R443 Hallucinations, unspecified: Secondary | ICD-10-CM | POA: Diagnosis not present

## 2023-07-02 DIAGNOSIS — F03A2 Unspecified dementia, mild, with psychotic disturbance: Secondary | ICD-10-CM | POA: Diagnosis not present

## 2023-07-02 DIAGNOSIS — F03A11 Unspecified dementia, mild, with agitation: Secondary | ICD-10-CM | POA: Insufficient documentation

## 2023-07-02 DIAGNOSIS — E039 Hypothyroidism, unspecified: Secondary | ICD-10-CM

## 2023-07-02 DIAGNOSIS — R413 Other amnesia: Secondary | ICD-10-CM

## 2023-07-02 LAB — LIPID PANEL
Cholesterol: 201 mg/dL — ABNORMAL HIGH (ref 0–200)
HDL: 76 mg/dL (ref >39.00)
LDL Cholesterol: 112 mg/dL — ABNORMAL HIGH (ref 0–99)
NonHDL: 125.3
Total CHOL/HDL Ratio: 3
Triglycerides: 69 mg/dL (ref 0.0–149.0)
VLDL: 13.8 mg/dL (ref 0.0–40.0)

## 2023-07-02 LAB — CBC WITH DIFFERENTIAL/PLATELET
Basophils Absolute: 0 10*3/uL (ref 0.0–0.1)
Basophils Relative: 0.6 % (ref 0.0–3.0)
Eosinophils Absolute: 0 10*3/uL (ref 0.0–0.7)
Eosinophils Relative: 0.5 % (ref 0.0–5.0)
HCT: 41.2 % (ref 36.0–46.0)
Hemoglobin: 13.5 g/dL (ref 12.0–15.0)
Lymphocytes Relative: 13.1 % (ref 12.0–46.0)
Lymphs Abs: 1 10*3/uL (ref 0.7–4.0)
MCHC: 32.7 g/dL (ref 30.0–36.0)
MCV: 93.8 fl (ref 78.0–100.0)
Monocytes Absolute: 0.6 10*3/uL (ref 0.1–1.0)
Monocytes Relative: 7.5 % (ref 3.0–12.0)
Neutro Abs: 6.2 10*3/uL (ref 1.4–7.7)
Neutrophils Relative %: 78.3 % — ABNORMAL HIGH (ref 43.0–77.0)
Platelets: 151 10*3/uL (ref 150.0–400.0)
RBC: 4.39 Mil/uL (ref 3.87–5.11)
RDW: 13.6 % (ref 11.5–15.5)
WBC: 8 10*3/uL (ref 4.0–10.5)

## 2023-07-02 LAB — COMPREHENSIVE METABOLIC PANEL
ALT: 12 U/L (ref 0–35)
AST: 16 U/L (ref 0–37)
Albumin: 3.6 g/dL (ref 3.5–5.2)
Alkaline Phosphatase: 62 U/L (ref 39–117)
BUN: 27 mg/dL — ABNORMAL HIGH (ref 6–23)
CO2: 30 mEq/L (ref 19–32)
Calcium: 9.3 mg/dL (ref 8.4–10.5)
Chloride: 105 mEq/L (ref 96–112)
Creatinine, Ser: 0.81 mg/dL (ref 0.40–1.20)
GFR: 62.05 mL/min (ref >60.00)
Glucose, Bld: 158 mg/dL — ABNORMAL HIGH (ref 70–99)
Potassium: 4.3 mEq/L (ref 3.5–5.1)
Sodium: 141 mEq/L (ref 135–145)
Total Bilirubin: 0.5 mg/dL (ref 0.2–1.2)
Total Protein: 5.9 g/dL — ABNORMAL LOW (ref 6.0–8.3)

## 2023-07-02 LAB — TSH: TSH: 1.97 u[IU]/mL (ref 0.35–5.50)

## 2023-07-02 LAB — VITAMIN B12: Vitamin B-12: 307 pg/mL (ref 211–911)

## 2023-07-02 MED ORDER — SERTRALINE HCL 25 MG PO TABS
25.0000 mg | ORAL_TABLET | Freq: Every day | ORAL | Status: DC
Start: 2023-07-02 — End: 2023-08-30

## 2023-07-02 NOTE — Progress Notes (Signed)
Established Patient Office Visit  Subjective   Patient ID: [name] [name], female    DOB: [birth year]  Age: 89 y.o. MRN: [id number]  Chief Complaint  Patient presents with   Hypothyroidism   Hypertension   Follow-up    HPI Discussed the use of AI scribe software for clinical note transcription with the patient, who gave verbal consent to proceed.  History of Present Illness   The patient, an elderly woman with macular degeneration, presents with her daughter with worsening confusion and hallucinations over the past month and a half. She believes her deceased husband is still alive and is concerned about feeding them and other people she believes are in her house. The patient's daughter reports that these hallucinations and confusion are causing significant distress for the patient. The patient's memory issues have also been causing functional problems, such as forgetting where she left her walker. Despite these issues, the patient is able to function in her home due to familiarity with her surroundings.      Patient Active Problem List   Diagnosis Date Noted   Hallucinations 07/02/2023   Mild dementia with psychotic disturbance (HCC) 07/02/2023   Cellulitis of right lower extremity 06/25/2022   Lower extremity edema 06/25/2022   Bilateral impacted cerumen 01/29/2022   Preventative health care 11/23/2021   Memory change 11/23/2021   Bruising 09/09/2020   Chronic right-sided low back pain without sciatica 06/03/2020   Acute mid back pain 06/03/2020   Weight decrease 08/04/2018   Anxiety 08/04/2018   Slow transit constipation 08/04/2018   Chest pain 09/05/2015   Hyperlipidemia 09/05/2015   CKD (chronic kidney disease), stage II-III 09/05/2015   Abnormal EKG 09/05/2015   HEMATURIA UNSPECIFIED 11/01/2009   BACK PAIN 11/01/2009   Hypothyroidism 07/11/2008   CERUMEN IMPACTION, BILATERAL 07/01/2008   DIZZINESS 07/01/2008   Essential hypertension 01/15/2008   KNEE PAIN, RIGHT  01/15/2008   Osteoporosis 01/15/2008   TONSILLECTOMY AND ADENOIDECTOMY, HX OF 01/15/2008   Past Medical History:  Diagnosis Date   Arthritis    "right thumb" (09/05/2015)   Hyperlipidemia    Hypertension    Hypothyroidism    Osteoporosis    Past Surgical History:  Procedure Laterality Date   CATARACT EXTRACTION W/ INTRAOCULAR LENS  IMPLANT, BILATERAL Bilateral 2010   CLOSED REDUCTION SHOULDER DISLOCATION Right    OVARIAN CYST SURGERY  1949   TONSILLECTOMY     Social History   Tobacco Use   Smoking status: Never   Smokeless tobacco: Never  Substance Use Topics   Alcohol use: No   Drug use: No   Social History   Socioeconomic History   Marital status: Married    Spouse name: Not on file   Number of children: Not on file   Years of education: Not on file   Highest education level: Not on file  Occupational History   Not on file  Tobacco Use   Smoking status: Never   Smokeless tobacco: Never  Substance and Sexual Activity   Alcohol use: No   Drug use: No   Sexual activity: Yes    Partners: Male  Other Topics Concern   Not on file  Social History Narrative   Lives with her husband in a house    2 children close by to help   Social Determinants of Health   Financial Resource Strain: Low Risk  (03/16/2022)   Overall Financial Resource Strain (CARDIA)    Difficulty of Paying Living Expenses: Not hard at all  Food Insecurity: No Food Insecurity (03/16/2022)   Hunger Vital Sign    Worried About Running Out of Food in the Last Year: Never true    Ran Out of Food in the Last Year: Never true  Transportation Needs: No Transportation Needs (12/08/2021)   PRAPARE - Administrator, Civil Service (Medical): No    Lack of Transportation (Non-Medical): No  Physical Activity: Insufficiently Active (03/16/2022)   Exercise Vital Sign    Days of Exercise per Week: 3 days    Minutes of Exercise per Session: 10 min  Stress: Stress Concern Present (03/16/2022)   [employer] - Occupational Stress Questionnaire    Feeling of Stress : Rather much  Social Connections: Not on file  Intimate Partner Violence: Not on file   Family Status  Relation Name Status   Mother  Deceased   Father  Deceased   Brother  Alive       4   Brother  Deceased       1   Sister  Deceased       1   Sister  (Not Specified)   Brother  (Not Specified)   Daughter  (Not Specified)   MGM  Deceased   MGF  Deceased   PGM  Deceased   PGF  Deceased  No partnership data on file   Family History  Problem Relation Age of Onset   Cancer Mother        bladder   Cancer Father        unknown   Ovarian cancer Sister    Heart disease Brother        All 5 brothers have had heart issues starting in their 68s   Breast cancer Daughter    No Known Allergies    ROS    Objective:     BP 110/60 (BP Location: Left Arm, Patient Position: Sitting, Cuff Size: Normal)   Pulse 81   Temp 98.6 F (37 C) (Oral)   Resp 18   Ht 5\' 3"  (1.6 m)   Wt 109 lb 12.8 oz (49.8 kg)   SpO2 97%   BMI 19.45 kg/m  BP Readings from Last 3 Encounters:  07/02/23 110/60  05/03/23 120/70  04/07/23 (!) 119/51   Wt Readings from Last 3 Encounters:  07/02/23 109 lb 12.8 oz (49.8 kg)  05/03/23 110 lb 12.8 oz (50.3 kg)  04/07/23 108 lb (49 kg)   SpO2 Readings from Last 3 Encounters:  07/02/23 97%  05/03/23 97%  04/07/23 99%      Physical Exam   No results found for any visits on 07/02/23.  Last CBC Lab Results  Component Value Date   WBC 8.9 05/03/2023   HGB 14.4 05/03/2023   HCT 43.6 05/03/2023   MCV 94.1 05/03/2023   MCH 31.0 12/23/2022   RDW 14.2 05/03/2023   PLT 182.0 05/03/2023   Last metabolic panel Lab Results  Component Value Date   GLUCOSE 87 05/03/2023   NA 141 05/03/2023   K 4.7 05/03/2023   CL 104 05/03/2023   CO2 31 05/03/2023   BUN 22 05/03/2023   CREATININE 0.82 05/03/2023   GFR 61.21 05/03/2023   CALCIUM 9.3 05/03/2023   PROT 6.9  05/03/2023   ALBUMIN 3.9 05/03/2023   BILITOT 0.5 05/03/2023   ALKPHOS 90 05/03/2023   AST 25 05/03/2023   ALT 26 05/03/2023   ANIONGAP 7 12/23/2022   Last lipids Lab Results  Component  Value Date   CHOL 194 01/01/2023   HDL 77.60 01/01/2023   LDLCALC 103 (H) 01/01/2023   LDLDIRECT 157.4 01/29/2011   TRIG 64.0 01/01/2023   CHOLHDL 2 01/01/2023   Last hemoglobin A1c Lab Results  Component Value Date   HGBA1C 5.4 04/04/2022   Last thyroid functions Lab Results  Component Value Date   TSH 15.91 (H) 05/03/2023   T4TOTAL 8.8 05/03/2023   Last vitamin D Lab Results  Component Value Date   VD25OH 34 11/02/2010   Last vitamin B12 and Folate Lab Results  Component Value Date   VITAMINB12 357 05/03/2023   FOLATE 15.1 07/01/2008      The ASCVD Risk score (Arnett DK, et al., 2019) failed to calculate for the following reasons:   The 2019 ASCVD risk score is only valid for ages 2 to 7    Assessment & Plan:   Problem List Items Addressed This Visit       Unprioritized   Anxiety   Relevant Medications   sertraline (ZOLOFT) 25 MG tablet   Hypothyroidism   Mild dementia with psychotic disturbance (HCC) - Primary    Start aricept 5 mg  F/u 3 months or sooner as needed Family is thinkiing about referral to neurology      Relevant Medications   sertraline (ZOLOFT) 25 MG tablet   Memory change   Relevant Medications   sertraline (ZOLOFT) 25 MG tablet   Other Relevant Orders   CBC with Differential/Platelet   Comprehensive metabolic panel   Lipid panel   TSH   Vitamin B12   RPR   Ambulatory referral to Neurology   Hallucinations    Pt will d/w siblings about referrals for neuro/ psych Zoloft to be started  Start aricept  Offered mri and neuro referral , but patients family would like to hold off for now      Relevant Orders   Ambulatory referral to Neurology  Assessment and Plan    Dementia Progressive cognitive decline with hallucinations and  delusions. Patient is experiencing distress related to these symptoms. -Consider starting Sertraline 25mg  daily to help manage anxiety and distress related to hallucinations and delusions. -Consider referral to neurology or psychiatry for further management of dementia symptoms.  Hypothyroidism Patient is on treatment, but recent labs showed elevated TSH. -Check thyroid function tests today to assess need for adjustment in thyroid replacement therapy.  Macular Degeneration Patient has poor vision but is able to navigate familiar surroundings. -No changes to current management.  General Health Maintenance -Check B12 and RPR as part of dementia workup. -Consider social work consultation for potential future need of in-home care.        No follow-ups on file.    Donato Schultz, DO

## 2023-07-02 NOTE — Assessment & Plan Note (Signed)
Start aricept 5 mg  F/u 3 months or sooner as needed Family is thinkiing about referral to neurology

## 2023-07-02 NOTE — Assessment & Plan Note (Addendum)
Pt will d/w siblings about referrals for neuro/ psych Zoloft to be started  Start aricept  Offered mri and neuro referral , but patients family would like to hold off for now

## 2023-07-03 DIAGNOSIS — E785 Hyperlipidemia, unspecified: Secondary | ICD-10-CM | POA: Diagnosis not present

## 2023-07-03 DIAGNOSIS — F419 Anxiety disorder, unspecified: Secondary | ICD-10-CM | POA: Diagnosis not present

## 2023-07-03 DIAGNOSIS — M810 Age-related osteoporosis without current pathological fracture: Secondary | ICD-10-CM | POA: Diagnosis not present

## 2023-07-03 DIAGNOSIS — H6123 Impacted cerumen, bilateral: Secondary | ICD-10-CM | POA: Diagnosis not present

## 2023-07-03 DIAGNOSIS — K5901 Slow transit constipation: Secondary | ICD-10-CM | POA: Diagnosis not present

## 2023-07-03 DIAGNOSIS — H9190 Unspecified hearing loss, unspecified ear: Secondary | ICD-10-CM | POA: Diagnosis not present

## 2023-07-03 DIAGNOSIS — I129 Hypertensive chronic kidney disease with stage 1 through stage 4 chronic kidney disease, or unspecified chronic kidney disease: Secondary | ICD-10-CM | POA: Diagnosis not present

## 2023-07-03 DIAGNOSIS — E039 Hypothyroidism, unspecified: Secondary | ICD-10-CM | POA: Diagnosis not present

## 2023-07-03 DIAGNOSIS — R296 Repeated falls: Secondary | ICD-10-CM | POA: Diagnosis not present

## 2023-07-03 DIAGNOSIS — M1909 Primary osteoarthritis, other specified site: Secondary | ICD-10-CM | POA: Diagnosis not present

## 2023-07-03 DIAGNOSIS — G8929 Other chronic pain: Secondary | ICD-10-CM | POA: Diagnosis not present

## 2023-07-03 DIAGNOSIS — N183 Chronic kidney disease, stage 3 unspecified: Secondary | ICD-10-CM | POA: Diagnosis not present

## 2023-07-03 DIAGNOSIS — H3530 Unspecified macular degeneration: Secondary | ICD-10-CM | POA: Diagnosis not present

## 2023-07-03 DIAGNOSIS — M545 Low back pain, unspecified: Secondary | ICD-10-CM | POA: Diagnosis not present

## 2023-07-03 DIAGNOSIS — F0280 Dementia in other diseases classified elsewhere without behavioral disturbance: Secondary | ICD-10-CM | POA: Diagnosis not present

## 2023-07-03 LAB — RPR: RPR Ser Ql: NONREACTIVE

## 2023-07-12 DIAGNOSIS — M545 Low back pain, unspecified: Secondary | ICD-10-CM | POA: Diagnosis not present

## 2023-07-12 DIAGNOSIS — M1909 Primary osteoarthritis, other specified site: Secondary | ICD-10-CM | POA: Diagnosis not present

## 2023-07-12 DIAGNOSIS — H3530 Unspecified macular degeneration: Secondary | ICD-10-CM | POA: Diagnosis not present

## 2023-07-12 DIAGNOSIS — E039 Hypothyroidism, unspecified: Secondary | ICD-10-CM | POA: Diagnosis not present

## 2023-07-12 DIAGNOSIS — F419 Anxiety disorder, unspecified: Secondary | ICD-10-CM | POA: Diagnosis not present

## 2023-07-12 DIAGNOSIS — F0280 Dementia in other diseases classified elsewhere without behavioral disturbance: Secondary | ICD-10-CM | POA: Diagnosis not present

## 2023-07-12 DIAGNOSIS — N183 Chronic kidney disease, stage 3 unspecified: Secondary | ICD-10-CM | POA: Diagnosis not present

## 2023-07-12 DIAGNOSIS — M810 Age-related osteoporosis without current pathological fracture: Secondary | ICD-10-CM | POA: Diagnosis not present

## 2023-07-12 DIAGNOSIS — I129 Hypertensive chronic kidney disease with stage 1 through stage 4 chronic kidney disease, or unspecified chronic kidney disease: Secondary | ICD-10-CM | POA: Diagnosis not present

## 2023-07-12 DIAGNOSIS — G8929 Other chronic pain: Secondary | ICD-10-CM | POA: Diagnosis not present

## 2023-07-12 DIAGNOSIS — K5901 Slow transit constipation: Secondary | ICD-10-CM | POA: Diagnosis not present

## 2023-07-12 DIAGNOSIS — R296 Repeated falls: Secondary | ICD-10-CM | POA: Diagnosis not present

## 2023-07-12 DIAGNOSIS — E785 Hyperlipidemia, unspecified: Secondary | ICD-10-CM | POA: Diagnosis not present

## 2023-07-12 DIAGNOSIS — H9190 Unspecified hearing loss, unspecified ear: Secondary | ICD-10-CM | POA: Diagnosis not present

## 2023-07-12 DIAGNOSIS — H6123 Impacted cerumen, bilateral: Secondary | ICD-10-CM | POA: Diagnosis not present

## 2023-07-17 DIAGNOSIS — E039 Hypothyroidism, unspecified: Secondary | ICD-10-CM | POA: Diagnosis not present

## 2023-07-17 DIAGNOSIS — E785 Hyperlipidemia, unspecified: Secondary | ICD-10-CM | POA: Diagnosis not present

## 2023-07-17 DIAGNOSIS — F419 Anxiety disorder, unspecified: Secondary | ICD-10-CM | POA: Diagnosis not present

## 2023-07-17 DIAGNOSIS — M810 Age-related osteoporosis without current pathological fracture: Secondary | ICD-10-CM | POA: Diagnosis not present

## 2023-07-17 DIAGNOSIS — H6123 Impacted cerumen, bilateral: Secondary | ICD-10-CM | POA: Diagnosis not present

## 2023-07-17 DIAGNOSIS — F0280 Dementia in other diseases classified elsewhere without behavioral disturbance: Secondary | ICD-10-CM | POA: Diagnosis not present

## 2023-07-17 DIAGNOSIS — M545 Low back pain, unspecified: Secondary | ICD-10-CM | POA: Diagnosis not present

## 2023-07-17 DIAGNOSIS — R296 Repeated falls: Secondary | ICD-10-CM | POA: Diagnosis not present

## 2023-07-17 DIAGNOSIS — M1909 Primary osteoarthritis, other specified site: Secondary | ICD-10-CM | POA: Diagnosis not present

## 2023-07-17 DIAGNOSIS — N183 Chronic kidney disease, stage 3 unspecified: Secondary | ICD-10-CM | POA: Diagnosis not present

## 2023-07-17 DIAGNOSIS — H9190 Unspecified hearing loss, unspecified ear: Secondary | ICD-10-CM | POA: Diagnosis not present

## 2023-07-17 DIAGNOSIS — K5901 Slow transit constipation: Secondary | ICD-10-CM | POA: Diagnosis not present

## 2023-07-17 DIAGNOSIS — I129 Hypertensive chronic kidney disease with stage 1 through stage 4 chronic kidney disease, or unspecified chronic kidney disease: Secondary | ICD-10-CM | POA: Diagnosis not present

## 2023-07-17 DIAGNOSIS — H3530 Unspecified macular degeneration: Secondary | ICD-10-CM | POA: Diagnosis not present

## 2023-07-17 DIAGNOSIS — G8929 Other chronic pain: Secondary | ICD-10-CM | POA: Diagnosis not present

## 2023-07-23 DIAGNOSIS — G8929 Other chronic pain: Secondary | ICD-10-CM | POA: Diagnosis not present

## 2023-07-23 DIAGNOSIS — H6123 Impacted cerumen, bilateral: Secondary | ICD-10-CM | POA: Diagnosis not present

## 2023-07-23 DIAGNOSIS — M545 Low back pain, unspecified: Secondary | ICD-10-CM | POA: Diagnosis not present

## 2023-07-23 DIAGNOSIS — R296 Repeated falls: Secondary | ICD-10-CM | POA: Diagnosis not present

## 2023-07-23 DIAGNOSIS — K5901 Slow transit constipation: Secondary | ICD-10-CM | POA: Diagnosis not present

## 2023-07-23 DIAGNOSIS — N183 Chronic kidney disease, stage 3 unspecified: Secondary | ICD-10-CM | POA: Diagnosis not present

## 2023-07-23 DIAGNOSIS — M1909 Primary osteoarthritis, other specified site: Secondary | ICD-10-CM | POA: Diagnosis not present

## 2023-07-23 DIAGNOSIS — F419 Anxiety disorder, unspecified: Secondary | ICD-10-CM | POA: Diagnosis not present

## 2023-07-23 DIAGNOSIS — H3530 Unspecified macular degeneration: Secondary | ICD-10-CM | POA: Diagnosis not present

## 2023-07-23 DIAGNOSIS — E785 Hyperlipidemia, unspecified: Secondary | ICD-10-CM | POA: Diagnosis not present

## 2023-07-23 DIAGNOSIS — E039 Hypothyroidism, unspecified: Secondary | ICD-10-CM | POA: Diagnosis not present

## 2023-07-23 DIAGNOSIS — F0280 Dementia in other diseases classified elsewhere without behavioral disturbance: Secondary | ICD-10-CM | POA: Diagnosis not present

## 2023-07-23 DIAGNOSIS — I129 Hypertensive chronic kidney disease with stage 1 through stage 4 chronic kidney disease, or unspecified chronic kidney disease: Secondary | ICD-10-CM | POA: Diagnosis not present

## 2023-07-23 DIAGNOSIS — H9190 Unspecified hearing loss, unspecified ear: Secondary | ICD-10-CM | POA: Diagnosis not present

## 2023-07-23 DIAGNOSIS — M810 Age-related osteoporosis without current pathological fracture: Secondary | ICD-10-CM | POA: Diagnosis not present

## 2023-07-24 ENCOUNTER — Telehealth: Payer: Self-pay | Admitting: Family Medicine

## 2023-07-24 NOTE — Telephone Encounter (Signed)
Pt's daughter, Clydie Braun, called and wanted to know if her mom can be around someone with shingles if the person is on antibiotics. Please call & advise.

## 2023-07-24 NOTE — Telephone Encounter (Signed)
Pt's daughter made aware.

## 2023-07-29 ENCOUNTER — Telehealth: Payer: Self-pay | Admitting: Family Medicine

## 2023-07-29 NOTE — Telephone Encounter (Signed)
Pt would like to discuss her mother's bp medication via telephone. Please call & advise.

## 2023-07-30 NOTE — Telephone Encounter (Signed)
Spoke with patient's daughter. Also see office note from 07/02/23. Daughter states patients confusion and possible hallucinations have gotten worse since starting the Zoloft. Pt has been on this medication for a month. Daughter states patient does seem happier but will express anger and become upset when something doesn't happen. Daughter gave the example of patient asking to go pick up her husband from work (husband is deceased). Daughter states patient is seeing people and a boy in her house. Please advise

## 2023-07-30 NOTE — Telephone Encounter (Signed)
Would rec stopping medication and reconvening in the office to determining next steps. Ty.

## 2023-07-31 ENCOUNTER — Telehealth: Payer: Self-pay | Admitting: Family Medicine

## 2023-07-31 DIAGNOSIS — N183 Chronic kidney disease, stage 3 unspecified: Secondary | ICD-10-CM | POA: Diagnosis not present

## 2023-07-31 DIAGNOSIS — M545 Low back pain, unspecified: Secondary | ICD-10-CM | POA: Diagnosis not present

## 2023-07-31 DIAGNOSIS — M810 Age-related osteoporosis without current pathological fracture: Secondary | ICD-10-CM | POA: Diagnosis not present

## 2023-07-31 DIAGNOSIS — G8929 Other chronic pain: Secondary | ICD-10-CM | POA: Diagnosis not present

## 2023-07-31 DIAGNOSIS — I129 Hypertensive chronic kidney disease with stage 1 through stage 4 chronic kidney disease, or unspecified chronic kidney disease: Secondary | ICD-10-CM | POA: Diagnosis not present

## 2023-07-31 DIAGNOSIS — F0280 Dementia in other diseases classified elsewhere without behavioral disturbance: Secondary | ICD-10-CM | POA: Diagnosis not present

## 2023-07-31 DIAGNOSIS — M1909 Primary osteoarthritis, other specified site: Secondary | ICD-10-CM | POA: Diagnosis not present

## 2023-07-31 DIAGNOSIS — H3530 Unspecified macular degeneration: Secondary | ICD-10-CM | POA: Diagnosis not present

## 2023-07-31 DIAGNOSIS — H9190 Unspecified hearing loss, unspecified ear: Secondary | ICD-10-CM | POA: Diagnosis not present

## 2023-07-31 DIAGNOSIS — K5901 Slow transit constipation: Secondary | ICD-10-CM | POA: Diagnosis not present

## 2023-07-31 DIAGNOSIS — R296 Repeated falls: Secondary | ICD-10-CM | POA: Diagnosis not present

## 2023-07-31 DIAGNOSIS — E785 Hyperlipidemia, unspecified: Secondary | ICD-10-CM | POA: Diagnosis not present

## 2023-07-31 DIAGNOSIS — H6123 Impacted cerumen, bilateral: Secondary | ICD-10-CM | POA: Diagnosis not present

## 2023-07-31 DIAGNOSIS — F419 Anxiety disorder, unspecified: Secondary | ICD-10-CM | POA: Diagnosis not present

## 2023-07-31 DIAGNOSIS — E039 Hypothyroidism, unspecified: Secondary | ICD-10-CM | POA: Diagnosis not present

## 2023-07-31 NOTE — Telephone Encounter (Signed)
Spoke with patient's daughter and advised stopping medication. We did discuss taking half a tab for a week and stopping medication and discussed withdrawal sxs.

## 2023-07-31 NOTE — Telephone Encounter (Signed)
Galina from [facility] nurse called to inform Dr. Laury Axon that the pt took a backwards fall this morning around 3 am where she hit her head. [place] stated that there is a small bump but the pt seems to be doing fine. She doesn't believe the pt needs an appointment but just wanted to notify pcp. Please follow up and advise with pt.

## 2023-08-01 NOTE — Telephone Encounter (Signed)
Noted. Have spoken with patient's daughter

## 2023-08-07 DIAGNOSIS — F419 Anxiety disorder, unspecified: Secondary | ICD-10-CM | POA: Diagnosis not present

## 2023-08-07 DIAGNOSIS — E785 Hyperlipidemia, unspecified: Secondary | ICD-10-CM | POA: Diagnosis not present

## 2023-08-07 DIAGNOSIS — H3530 Unspecified macular degeneration: Secondary | ICD-10-CM | POA: Diagnosis not present

## 2023-08-07 DIAGNOSIS — F0280 Dementia in other diseases classified elsewhere without behavioral disturbance: Secondary | ICD-10-CM | POA: Diagnosis not present

## 2023-08-07 DIAGNOSIS — H9190 Unspecified hearing loss, unspecified ear: Secondary | ICD-10-CM | POA: Diagnosis not present

## 2023-08-07 DIAGNOSIS — G8929 Other chronic pain: Secondary | ICD-10-CM | POA: Diagnosis not present

## 2023-08-07 DIAGNOSIS — E039 Hypothyroidism, unspecified: Secondary | ICD-10-CM | POA: Diagnosis not present

## 2023-08-07 DIAGNOSIS — I129 Hypertensive chronic kidney disease with stage 1 through stage 4 chronic kidney disease, or unspecified chronic kidney disease: Secondary | ICD-10-CM | POA: Diagnosis not present

## 2023-08-07 DIAGNOSIS — M810 Age-related osteoporosis without current pathological fracture: Secondary | ICD-10-CM | POA: Diagnosis not present

## 2023-08-07 DIAGNOSIS — M545 Low back pain, unspecified: Secondary | ICD-10-CM | POA: Diagnosis not present

## 2023-08-07 DIAGNOSIS — H6123 Impacted cerumen, bilateral: Secondary | ICD-10-CM | POA: Diagnosis not present

## 2023-08-07 DIAGNOSIS — K5901 Slow transit constipation: Secondary | ICD-10-CM | POA: Diagnosis not present

## 2023-08-07 DIAGNOSIS — N183 Chronic kidney disease, stage 3 unspecified: Secondary | ICD-10-CM | POA: Diagnosis not present

## 2023-08-07 DIAGNOSIS — R296 Repeated falls: Secondary | ICD-10-CM | POA: Diagnosis not present

## 2023-08-07 DIAGNOSIS — M1909 Primary osteoarthritis, other specified site: Secondary | ICD-10-CM | POA: Diagnosis not present

## 2023-08-14 ENCOUNTER — Encounter: Payer: Self-pay | Admitting: Podiatry

## 2023-08-14 ENCOUNTER — Ambulatory Visit: Payer: Medicare Other | Admitting: Podiatry

## 2023-08-14 DIAGNOSIS — M79674 Pain in right toe(s): Secondary | ICD-10-CM | POA: Diagnosis not present

## 2023-08-14 DIAGNOSIS — M79672 Pain in left foot: Secondary | ICD-10-CM

## 2023-08-14 DIAGNOSIS — L84 Corns and callosities: Secondary | ICD-10-CM | POA: Diagnosis not present

## 2023-08-14 DIAGNOSIS — M79671 Pain in right foot: Secondary | ICD-10-CM

## 2023-08-14 DIAGNOSIS — M79675 Pain in left toe(s): Secondary | ICD-10-CM

## 2023-08-14 DIAGNOSIS — B351 Tinea unguium: Secondary | ICD-10-CM | POA: Diagnosis not present

## 2023-08-15 ENCOUNTER — Telehealth: Payer: Self-pay | Admitting: Family Medicine

## 2023-08-15 NOTE — Telephone Encounter (Signed)
Caller/Agency: Adelina Mings [facility]) Callback Number: [phone] Requesting OT/PT/Skilled Nursing/Social Work/Speech Therapy: PT Frequency: Move discharge from this week to next week, 1 w 1

## 2023-08-15 NOTE — Telephone Encounter (Signed)
Attempted to call. Phone rang and went to dead air x2.

## 2023-08-18 NOTE — Progress Notes (Signed)
  Subjective:  Patient ID: [name] [name], female    DOB: 02-07-1927,  MRN: [id number]  [name] [name] presents to clinic today for: corn(s) left foot, callus(es) left foot and painful mycotic nails.  Pain interferes with ambulation. Aggravating factors include wearing enclosed shoe gear. Painful toenails interfere with ambulation. Aggravating factors include wearing enclosed shoe gear. Pain is relieved with periodic professional debridement. Painful corns and calluses are aggravated when weightbearing with and without shoegear. Pain is relieved with periodic professional debridement.  Chief Complaint  Patient presents with   RFC     RFC     PCP is Zola Button, [employer], DO.  No Known Allergies  Review of Systems: Negative except as noted in the HPI.  Objective: No changes noted in today's physical examination. There were no vitals filed for this visit.  [name] [name] is a pleasant 89 y.o. female in NAD. AAO x 3.  Vascular Examination: CFT immediate b/l LE. Palpable DP/PT pulses b/l LE. Digital hair present b/l. Skin temperature gradient WNL b/l. No pain with calf compression b/l. No edema noted b/l. No cyanosis or clubbing noted b/l LE.Marland Kitchen  Dermatological Examination: Pedal skin with normal turgor, texture and tone b/l. No open wounds. No interdigital macerations b/l. Toenails 1-5 b/l thickened, discolored, dystrophic with subungual debris. There is pain on palpation to dorsal aspect of nailplates. Hyperkeratotic lesion(s) L 5th toe.  No erythema, no edema, no drainage, no fluctuance. Porokeratotic lesion(s) submet head 1 left foot. No erythema, no edema, no drainage, no fluctuance..  Neurological Examination: Protective sensation intact with 10 gram monofilament b/l LE. Vibratory sensation intact b/l LE.   Musculoskeletal Examination: Muscle strength 5/5 to all lower extremity muscle groups bilaterally. HAV with bunion deformity noted b/l LE. Utilizes transport chair for mobility  assistance.  Assessment/Plan: 1. Pain due to onychomycosis of toenails of both feet   2. Corns and callosities   3. Pain in both feet     -Patient's family member present. All questions/concerns addressed on today's visit. -Patient to continue soft, supportive shoe gear daily. -Toenails 1-5 b/l were debrided in length and girth with sterile nail nippers and dremel without iatrogenic bleeding.  -Corn(s) L 5th toe and callus(es) submet head 1 left foot were pared utilizing sterile scalpel blade without incident. Total number debrided =2. -Patient/POA to call should there be question/concern in the interim.   Return in about 9 weeks (around 10/16/2023).  [name], DPM

## 2023-08-20 DIAGNOSIS — N183 Chronic kidney disease, stage 3 unspecified: Secondary | ICD-10-CM | POA: Diagnosis not present

## 2023-08-20 DIAGNOSIS — M810 Age-related osteoporosis without current pathological fracture: Secondary | ICD-10-CM | POA: Diagnosis not present

## 2023-08-20 DIAGNOSIS — G8929 Other chronic pain: Secondary | ICD-10-CM | POA: Diagnosis not present

## 2023-08-20 DIAGNOSIS — H9190 Unspecified hearing loss, unspecified ear: Secondary | ICD-10-CM | POA: Diagnosis not present

## 2023-08-20 DIAGNOSIS — M545 Low back pain, unspecified: Secondary | ICD-10-CM | POA: Diagnosis not present

## 2023-08-20 DIAGNOSIS — F419 Anxiety disorder, unspecified: Secondary | ICD-10-CM | POA: Diagnosis not present

## 2023-08-20 DIAGNOSIS — K5901 Slow transit constipation: Secondary | ICD-10-CM | POA: Diagnosis not present

## 2023-08-20 DIAGNOSIS — E785 Hyperlipidemia, unspecified: Secondary | ICD-10-CM | POA: Diagnosis not present

## 2023-08-20 DIAGNOSIS — F0280 Dementia in other diseases classified elsewhere without behavioral disturbance: Secondary | ICD-10-CM | POA: Diagnosis not present

## 2023-08-20 DIAGNOSIS — R296 Repeated falls: Secondary | ICD-10-CM | POA: Diagnosis not present

## 2023-08-20 DIAGNOSIS — M1909 Primary osteoarthritis, other specified site: Secondary | ICD-10-CM | POA: Diagnosis not present

## 2023-08-20 DIAGNOSIS — I129 Hypertensive chronic kidney disease with stage 1 through stage 4 chronic kidney disease, or unspecified chronic kidney disease: Secondary | ICD-10-CM | POA: Diagnosis not present

## 2023-08-20 DIAGNOSIS — E039 Hypothyroidism, unspecified: Secondary | ICD-10-CM | POA: Diagnosis not present

## 2023-08-20 DIAGNOSIS — H6123 Impacted cerumen, bilateral: Secondary | ICD-10-CM | POA: Diagnosis not present

## 2023-08-20 DIAGNOSIS — H3530 Unspecified macular degeneration: Secondary | ICD-10-CM | POA: Diagnosis not present

## 2023-08-27 ENCOUNTER — Emergency Department (HOSPITAL_BASED_OUTPATIENT_CLINIC_OR_DEPARTMENT_OTHER)
Admission: EM | Admit: 2023-08-27 | Discharge: 2023-08-27 | Disposition: A | Discharge status: Home / Self Care | Payer: Medicare Other | Attending: Emergency Medicine | Admitting: Emergency Medicine

## 2023-08-27 ENCOUNTER — Encounter (HOSPITAL_BASED_OUTPATIENT_CLINIC_OR_DEPARTMENT_OTHER): Payer: Self-pay | Admitting: Urology

## 2023-08-27 ENCOUNTER — Emergency Department (HOSPITAL_BASED_OUTPATIENT_CLINIC_OR_DEPARTMENT_OTHER): Payer: Medicare Other

## 2023-08-27 ENCOUNTER — Other Ambulatory Visit: Payer: Self-pay

## 2023-08-27 DIAGNOSIS — M25461 Effusion, right knee: Secondary | ICD-10-CM | POA: Diagnosis not present

## 2023-08-27 DIAGNOSIS — Y92009 Unspecified place in unspecified non-institutional (private) residence as the place of occurrence of the external cause: Secondary | ICD-10-CM | POA: Insufficient documentation

## 2023-08-27 DIAGNOSIS — M25561 Pain in right knee: Secondary | ICD-10-CM

## 2023-08-27 DIAGNOSIS — W01198A Fall on same level from slipping, tripping and stumbling with subsequent striking against other object, initial encounter: Secondary | ICD-10-CM | POA: Diagnosis not present

## 2023-08-27 DIAGNOSIS — M85861 Other specified disorders of bone density and structure, right lower leg: Secondary | ICD-10-CM | POA: Diagnosis not present

## 2023-08-27 DIAGNOSIS — S0990XA Unspecified injury of head, initial encounter: Secondary | ICD-10-CM | POA: Diagnosis not present

## 2023-08-27 DIAGNOSIS — M47812 Spondylosis without myelopathy or radiculopathy, cervical region: Secondary | ICD-10-CM | POA: Diagnosis not present

## 2023-08-27 DIAGNOSIS — M4802 Spinal stenosis, cervical region: Secondary | ICD-10-CM | POA: Diagnosis not present

## 2023-08-27 DIAGNOSIS — S8001XA Contusion of right knee, initial encounter: Secondary | ICD-10-CM | POA: Diagnosis not present

## 2023-08-27 DIAGNOSIS — S199XXA Unspecified injury of neck, initial encounter: Secondary | ICD-10-CM | POA: Diagnosis not present

## 2023-08-27 DIAGNOSIS — S0003XA Contusion of scalp, initial encounter: Secondary | ICD-10-CM | POA: Diagnosis not present

## 2023-08-27 DIAGNOSIS — M1711 Unilateral primary osteoarthritis, right knee: Secondary | ICD-10-CM | POA: Diagnosis not present

## 2023-08-27 NOTE — ED Triage Notes (Signed)
Per family, pt fell this am, unwitnessed  Hit head and small know noted to right occipital region, also states some right knee pain with standing, denies any LOC with fall   Not on Blood thinners

## 2023-08-27 NOTE — ED Provider Notes (Signed)
[facility] Provider Note   CSN: [id number] Arrival date & time: 08/27/23  1320     History  Chief Complaint  Patient presents with   [name] [name]    [name] [name] is a 89 y.o. female.  Patient presents emergency department for evaluation of head injury.  Patient had a fall at home today and struck the back of her head.  Family brought her in for evaluation, for precautionary purposes.  Patient is feeling well at her baseline.  No headache.  She is acting normally per family.  No subsequent vomiting.  No weakness, numbness, or tingling in the arms of the legs.  Patient reported right knee pain on arrival, but currently denies.  No anticoagulation.  No recent illnesses or medication changes.       Home Medications Prior to Admission medications   Medication Sig Start Date End Date Taking? Authorizing Provider  calcium carbonate (OSCAL) 1500 (600 Ca) MG TABS tablet Take 1 tablet by mouth daily.    [provider]  levothyroxine (SYNTHROID) 125 MCG tablet Take 1 tablet (125 mcg total) by mouth daily. 05/06/23   Seabron Spates R, DO  lisinopril (ZESTRIL) 20 MG tablet Take 1 tablet (20 mg total) by mouth daily. 01/01/23   Donato Schultz, DO  Multiple Vitamins-Minerals (ICAPS AREDS 2 PO) Take 1 tablet by mouth in the morning and at bedtime.    [provider]  sertraline (ZOLOFT) 25 MG tablet Take 1 tablet (25 mg total) by mouth daily. 07/02/23   Donato Schultz, DO      Allergies    Patient has no known allergies.    Review of Systems   Review of Systems  Physical Exam Updated Vital Signs BP (!) 149/60 (BP Location: Right Arm)   Pulse 68   Temp 98 F (36.7 C)   Resp 20   Ht 5\' 3"  (1.6 m)   Wt 49.8 kg   SpO2 99%   BMI 19.45 kg/m  Physical Exam Vitals and nursing note reviewed.  Constitutional:      Appearance: She is well-developed.  HENT:     Head: Normocephalic. No raccoon eyes or Battle's  sign.     Comments: Very small right occipital swelling, no bleeding or laceration.    Right Ear: Tympanic membrane, ear canal and external ear normal. No hemotympanum.     Left Ear: Tympanic membrane, ear canal and external ear normal. No hemotympanum.     Nose: Nose normal.     Mouth/Throat:     Pharynx: Uvula midline.  Eyes:     General: Lids are normal.     Extraocular Movements:     Right eye: No nystagmus.     Left eye: No nystagmus.     Conjunctiva/sclera: Conjunctivae normal.     Pupils: Pupils are equal, round, and reactive to light.     Comments: No visible hyphema noted  Neck:     Comments: Full range of motion of neck Cardiovascular:     Rate and Rhythm: Normal rate and regular rhythm.  Pulmonary:     Effort: Pulmonary effort is normal.     Breath sounds: Normal breath sounds.  Abdominal:     Palpations: Abdomen is soft.     Tenderness: There is no abdominal tenderness.  Musculoskeletal:     Cervical back: Normal range of motion and neck supple. No tenderness or bony tenderness.     Thoracic back:  No tenderness or bony tenderness.     Lumbar back: No tenderness or bony tenderness.     Right knee: Normal range of motion. No tenderness.     Left knee: Normal range of motion. No tenderness.  Skin:    General: Skin is warm and dry.  Neurological:     Mental Status: She is alert and oriented to person, place, and time.     GCS: GCS eye subscore is 4. GCS verbal subscore is 5. GCS motor subscore is 6.     Cranial Nerves: No cranial nerve deficit.     Sensory: No sensory deficit.     Coordination: Coordination normal.     Deep Tendon Reflexes: Reflexes are normal and symmetric.     ED Results / Procedures / Treatments   Labs (all labs ordered are listed, but only abnormal results are displayed) Labs Reviewed - No data to display  EKG None  Radiology CT Head Wo Contrast  Result Date: 08/27/2023 CLINICAL DATA:  Provided history: Polytrauma, blunt. Unwitnessed  fall. Head trauma (right occipital region), swelling this site. EXAM: CT HEAD WITHOUT CONTRAST CT CERVICAL SPINE WITHOUT CONTRAST TECHNIQUE: Multidetector CT imaging of the head and cervical spine was performed following the standard protocol without intravenous contrast. Multiplanar CT image reconstructions of the cervical spine were also generated. RADIATION DOSE REDUCTION: This exam was performed according to the departmental dose-optimization program which includes automated exposure control, adjustment of the mA and/or kV according to patient size and/or use of iterative reconstruction technique. COMPARISON:  Head CT 04/07/2023. FINDINGS: CT HEAD FINDINGS Brain: Advanced generalized cerebral atrophy. Patchy and ill-defined hypoattenuation within the cerebral white matter, nonspecific but compatible with mild-to-moderate chronic small vessel ischemic disease. There is no acute intracranial hemorrhage. No demarcated cortical infarct. No extra-axial fluid collection. No evidence of an intracranial mass. No midline shift. Vascular: No hyperdense vessel.  Atherosclerotic calcifications. Skull: No calvarial fracture or aggressive osseous lesion. Sinuses/Orbits: No acute orbital finding. No significant paranasal sinus disease at the imaged levels. Other: Trace fluid within the right mastoid air cells. Right occipital scalp hematoma. CT CERVICAL SPINE FINDINGS Alignment: Nonspecific reversal of the expected cervical lordosis. 3 mm C2-C3 grade 1 anterolisthesis. 2 mm C5-C6 grade 1 retrolisthesis. Slight grade 1 anterolisthesis at C7-T1 and T2-T3. Skull base and vertebrae: The basion-dental and atlanto-dental intervals are maintained.No evidence of acute fracture to the cervical spine. Soft tissues and spinal canal: No prevertebral fluid or swelling. No visible canal hematoma. Disc levels: Cervical spondylosis with multilevel disc space narrowing, disc bulges/central disc protrusions, endplate spurring, uncovertebral  hypertrophy and facet arthrosis. Disc space narrowing is greatest at C3-C4, C4-C5, C5-C6 and C6-C7 (advanced at these levels). Multilevel spinal canal narrowing. Most notably at C5-C6, a disc bulge with endplate spurring contributes to suspected moderate spinal canal stenosis. Multilevel bony neural foraminal narrowing. Degenerative changes also present at the C1-C2 articulation. Upper chest: No consolidation within the imaged lung apices. No visible pneumothorax. IMPRESSION: CT head: 1. No evidence of an acute intracranial abnormality. 2. Right occipital scalp hematoma. 3. Parenchymal atrophy and chronic small vessel ischemic disease, as described. CT cervical spine: 1. No evidence of an acute cervical spine fracture. 2. Nonspecific reversal of the expected cervical lordosis. 3. Grade 1 spondylolisthesis at C2-C3, C5-C6, C7-T1 and T2-T3. 4. Cervical spondylosis as described. Electronically Signed   By: Jackey Loge D.O.   On: 08/27/2023 16:37   CT Cervical Spine Wo Contrast  Result Date: 08/27/2023 CLINICAL DATA:  Provided history: Polytrauma,  blunt. Unwitnessed fall. Head trauma (right occipital region), swelling this site. EXAM: CT HEAD WITHOUT CONTRAST CT CERVICAL SPINE WITHOUT CONTRAST TECHNIQUE: Multidetector CT imaging of the head and cervical spine was performed following the standard protocol without intravenous contrast. Multiplanar CT image reconstructions of the cervical spine were also generated. RADIATION DOSE REDUCTION: This exam was performed according to the departmental dose-optimization program which includes automated exposure control, adjustment of the mA and/or kV according to patient size and/or use of iterative reconstruction technique. COMPARISON:  Head CT 04/07/2023. FINDINGS: CT HEAD FINDINGS Brain: Advanced generalized cerebral atrophy. Patchy and ill-defined hypoattenuation within the cerebral white matter, nonspecific but compatible with mild-to-moderate chronic small vessel  ischemic disease. There is no acute intracranial hemorrhage. No demarcated cortical infarct. No extra-axial fluid collection. No evidence of an intracranial mass. No midline shift. Vascular: No hyperdense vessel.  Atherosclerotic calcifications. Skull: No calvarial fracture or aggressive osseous lesion. Sinuses/Orbits: No acute orbital finding. No significant paranasal sinus disease at the imaged levels. Other: Trace fluid within the right mastoid air cells. Right occipital scalp hematoma. CT CERVICAL SPINE FINDINGS Alignment: Nonspecific reversal of the expected cervical lordosis. 3 mm C2-C3 grade 1 anterolisthesis. 2 mm C5-C6 grade 1 retrolisthesis. Slight grade 1 anterolisthesis at C7-T1 and T2-T3. Skull base and vertebrae: The basion-dental and atlanto-dental intervals are maintained.No evidence of acute fracture to the cervical spine. Soft tissues and spinal canal: No prevertebral fluid or swelling. No visible canal hematoma. Disc levels: Cervical spondylosis with multilevel disc space narrowing, disc bulges/central disc protrusions, endplate spurring, uncovertebral hypertrophy and facet arthrosis. Disc space narrowing is greatest at C3-C4, C4-C5, C5-C6 and C6-C7 (advanced at these levels). Multilevel spinal canal narrowing. Most notably at C5-C6, a disc bulge with endplate spurring contributes to suspected moderate spinal canal stenosis. Multilevel bony neural foraminal narrowing. Degenerative changes also present at the C1-C2 articulation. Upper chest: No consolidation within the imaged lung apices. No visible pneumothorax. IMPRESSION: CT head: 1. No evidence of an acute intracranial abnormality. 2. Right occipital scalp hematoma. 3. Parenchymal atrophy and chronic small vessel ischemic disease, as described. CT cervical spine: 1. No evidence of an acute cervical spine fracture. 2. Nonspecific reversal of the expected cervical lordosis. 3. Grade 1 spondylolisthesis at C2-C3, C5-C6, C7-T1 and T2-T3. 4.  Cervical spondylosis as described. Electronically Signed   By: Jackey Loge D.O.   On: 08/27/2023 16:37   DG Knee Complete 4 Views Right  Result Date: 08/27/2023 CLINICAL DATA:  Pain after fall. EXAM: RIGHT KNEE - COMPLETE 4 VIEW COMPARISON:  None Available. FINDINGS: Small joint effusion. Osteopenia. No fracture or dislocation. Mild joint space loss of the medial compartment and patellofemoral joint with small osteophytes. Chondrocalcinosis. Scattered vascular calcifications. IMPRESSION: Tricompartment degenerative changes with chondrocalcinosis and small effusion. Osteopenia. Electronically Signed   By: Karen Kays M.D.   On: 08/27/2023 16:20    Procedures Procedures    Medications Ordered in ED Medications - No data to display  ED Course/ Medical Decision Making/ A&P    Patient seen and examined. History obtained directly from patient and family member at bedside.   Labs/EKG: None ordered Imaging: Ordered CT head, CT cervical spine, x-ray of the right knee.  Medications/Fluids: None ordered  Most recent vital signs reviewed and are as follows: BP (!) 149/60 (BP Location: Right Arm)   Pulse 68   Temp 98 F (36.7 C)   Resp 20   Ht 5\' 3"  (1.6 m)   Wt 49.8 kg   SpO2 99%  BMI 19.45 kg/m   Initial impression: Unwitnessed fall.  Minor head injury.    Reassessment performed. Patient appears stable, no neurologic decompensation.  Imaging personally visualized and interpreted including: CT head and cervical spine, agree no acute injuries.  X-ray of the knee, and agree no acute injuries.  Reviewed pertinent lab work and imaging with patient and family at bedside. Questions answered.   Most current vital signs reviewed and are as follows: BP (!) 155/60   Pulse 62   Temp 98 F (36.7 C)   Resp 18   Ht 5\' 3"  (1.6 m)   Wt 49.8 kg   SpO2 99%   BMI 19.45 kg/m   Plan: Discharge to home.   Prescriptions written for: None  Other home care instructions discussed:  Monitoring of symptoms  ED return instructions discussed: Patient was counseled on head injury precautions and symptoms that should indicate their return to the ED.  These include severe worsening headache, vision changes, confusion, loss of consciousness, trouble walking, nausea & vomiting, or weakness/tingling in extremities.    Follow-up instructions discussed: Patient encouraged to follow-up with their PCP in 3 days with any persistent symptoms.                                 Medical Decision Making Amount and/or Complexity of Data Reviewed Radiology: ordered.   Patient with unwitnessed fall.  She is at her baseline.  No acute recent medical complaints voiced.  CT head and neck were negative.  X-ray of the knee was negative.  She just has a mild hematoma in her occipital area.  No decompensation since the injury.  She has a supportive family who will continue to help monitor her.  Do not feel that additional labs or workup is indicated at this time given patient's well appearance.          Final Clinical Impression(s) / ED Diagnoses Final diagnoses:  Contusion of scalp, initial encounter  Acute pain of right knee    Rx / DC Orders ED Discharge Orders     None         Renne Crigler, PA-C 08/27/23 1807    Virgina Norfolk, DO 08/30/23 1554

## 2023-08-27 NOTE — Discharge Instructions (Signed)
Please read and follow all provided instructions.  Your diagnoses today include:  1. Contusion of scalp, initial encounter   2. Acute pain of right knee     Tests performed today include: CT scan of your head and cervical spine that did not show any serious injury. X-ray of the knee that showed arthritis only, no fractures or dislocations Vital signs. See below for your results today.   Medications prescribed:  None  Take any prescribed medications only as directed.  Home care instructions:  Follow any educational materials contained in this packet.  BE VERY CAREFUL not to take multiple medicines containing Tylenol (also called acetaminophen). Doing so can lead to an overdose which can damage your liver and cause liver failure and possibly death.   Follow-up instructions: Please follow-up with your primary care provider in the next 3 days for further evaluation of your symptoms if any residual symptoms persist.   Return instructions:  SEEK IMMEDIATE MEDICAL ATTENTION IF: There is confusion or drowsiness (although children frequently become drowsy after injury).  You cannot awaken the injured person.  You have more than one episode of vomiting.  You notice dizziness or unsteadiness which is getting worse, or inability to walk.  You have convulsions or unconsciousness.  You experience severe, persistent headaches not relieved by Tylenol. You cannot use arms or legs normally.  There are changes in pupil sizes. (This is the black center in the colored part of the eye)  There is clear or bloody discharge from the nose or ears.  You have change in speech, vision, swallowing, or understanding.  Localized weakness, numbness, tingling, or change in bowel or bladder control. You have any other emergent concerns.  Additional Information: You have had a head injury which does not appear to require admission at this time.  Your vital signs today were: BP (!) 155/60   Pulse 62   Temp  98 F (36.7 C)   Resp 18   Ht 5\' 3"  (1.6 m)   Wt 49.8 kg   SpO2 99%   BMI 19.45 kg/m  If your blood pressure (BP) was elevated above 135/85 this visit, please have this repeated by your doctor within one month. --------------

## 2023-08-30 ENCOUNTER — Other Ambulatory Visit: Payer: Self-pay | Admitting: Family Medicine

## 2023-08-30 DIAGNOSIS — I10 Essential (primary) hypertension: Secondary | ICD-10-CM

## 2023-08-30 DIAGNOSIS — R413 Other amnesia: Secondary | ICD-10-CM

## 2023-08-30 DIAGNOSIS — F419 Anxiety disorder, unspecified: Secondary | ICD-10-CM

## 2023-09-02 ENCOUNTER — Other Ambulatory Visit: Payer: Self-pay | Admitting: Family Medicine

## 2023-09-02 DIAGNOSIS — I10 Essential (primary) hypertension: Secondary | ICD-10-CM

## 2023-09-17 ENCOUNTER — Ambulatory Visit: Payer: Medicare Other | Admitting: Podiatry

## 2023-09-18 ENCOUNTER — Ambulatory Visit: Payer: Medicare Other | Admitting: Podiatry

## 2023-09-19 ENCOUNTER — Other Ambulatory Visit: Payer: Self-pay | Admitting: Family Medicine

## 2023-09-19 DIAGNOSIS — F419 Anxiety disorder, unspecified: Secondary | ICD-10-CM

## 2023-09-19 DIAGNOSIS — R413 Other amnesia: Secondary | ICD-10-CM

## 2023-10-16 ENCOUNTER — Encounter: Payer: Self-pay | Admitting: Podiatry

## 2023-10-16 ENCOUNTER — Ambulatory Visit: Payer: Medicare Other | Admitting: Podiatry

## 2023-10-16 VITALS — Ht 63.0 in | Wt 109.0 lb

## 2023-10-16 DIAGNOSIS — B351 Tinea unguium: Secondary | ICD-10-CM | POA: Diagnosis not present

## 2023-10-16 DIAGNOSIS — M79672 Pain in left foot: Secondary | ICD-10-CM | POA: Diagnosis not present

## 2023-10-16 DIAGNOSIS — L84 Corns and callosities: Secondary | ICD-10-CM

## 2023-10-16 DIAGNOSIS — M79674 Pain in right toe(s): Secondary | ICD-10-CM

## 2023-10-16 DIAGNOSIS — M79675 Pain in left toe(s): Secondary | ICD-10-CM

## 2023-10-16 DIAGNOSIS — M79671 Pain in right foot: Secondary | ICD-10-CM | POA: Diagnosis not present

## 2023-10-20 NOTE — Progress Notes (Signed)
  Subjective:  Patient ID: [name] [name], female    DOB: 1927-02-09,  MRN: [id number]  [name] [name] presents to clinic today for preulcerative lesion(s) left foot and painful mycotic toenails that limit ambulation. Painful toenails interfere with ambulation. Aggravating factors include wearing enclosed shoe gear. Pain is relieved with periodic professional debridement. Painful preulcerative lesion(s) is/are aggravated when weightbearing with and without shoegear. Pain is relieved with periodic professional debridement.  Chief Complaint  Patient presents with   Nail Problem    Pt is here for RFC, not a diabetic, PCP is Dr Laury Axon and LOV was in the fall.   New problem(s): None.   PCP is Zola Button, [place] R, DO.  No Known Allergies  Review of Systems: Negative except as noted in the HPI.  Objective:  There were no vitals filed for this visit. [name] [name] is a pleasant 89 y.o. female WD, WN in NAD. AAO x 3.  Vascular Examination: CFT immediate b/l LE. Palpable DP/PT pulses b/l LE. Digital hair present b/l. Skin temperature gradient WNL b/l. No pain with calf compression b/l. No edema noted b/l. No cyanosis or clubbing noted b/l LE.Marland Kitchen  Dermatological Examination: Pedal skin with normal turgor, texture and tone b/l. No open wounds. No interdigital macerations b/l. Toenails 1-5 b/l thickened, discolored, dystrophic with subungual debris. There is pain on palpation to dorsal aspect of nailplates.   Hyperkeratotic lesion(s) left 5th and right 4th toes.  No erythema, no edema, no drainage, no fluctuance. Porokeratotic lesion(s) submet head 1 left foot. No erythema, no edema, no drainage, no fluctuance..  Neurological Examination: Protective sensation intact with 10 gram monofilament b/l LE. Vibratory sensation intact b/l LE.   Musculoskeletal Examination: Muscle strength 5/5 to all lower extremity muscle groups bilaterally. HAV with bunion deformity noted b/l LE. Utilizes transport chair  for mobility assistance.  Assessment/Plan: 1. Pain due to onychomycosis of toenails of both feet   2. Corns   3. Pre-ulcerative calluses   4. Pain in both feet     -Patient's family member present. All questions/concerns addressed on today's visit. -Patient has developed new corn on right 4th toe. Recommended new shoe gear with stretchable uppers. -Continue supportive shoe gear daily. -Toenails were debrided in length and girth of both feet with sterile nail nippers and dremel without iatrogenic bleeding.  -Hyperkeratotic lesion(s) L 5th toe and R 4th toe debrided with dremel bur without incident. Total number debrided=2. -Preulcerative lesion pared submet head 1 left foot utilizing sterile scalpel blade. Total number pared=1. -Patient/POA to call should there be question/concern in the interim.   Return in about 3 months (around 01/14/2024).  [name], DPM      [facility] LOCATION: [address] [place] [address]                   Office [phone]   [facility] LOCATION: [address] [place], [place] [address] Office [phone]

## 2023-10-21 ENCOUNTER — Other Ambulatory Visit: Payer: Self-pay | Admitting: Family Medicine

## 2023-10-21 DIAGNOSIS — I10 Essential (primary) hypertension: Secondary | ICD-10-CM

## 2023-11-19 ENCOUNTER — Telehealth: Payer: Self-pay

## 2023-11-19 NOTE — Telephone Encounter (Signed)
 Copied from CRM [id number]. Topic: Clinical - Medication Question >> Nov 19, 2023  3:19 PM Rosina BIRCH wrote: Reason for CRM: Brandi from [facility] called stating she need clarification on the dosage the pt should be taking for levothyroxine  CB# [phone]

## 2023-11-20 ENCOUNTER — Other Ambulatory Visit: Payer: Self-pay

## 2023-11-20 NOTE — Telephone Encounter (Signed)
 Returned call. Had to leave detailed voicemail. Med list updated.

## 2023-11-26 ENCOUNTER — Ambulatory Visit: Payer: Medicare Other | Admitting: Podiatry

## 2023-11-27 ENCOUNTER — Telehealth: Payer: Self-pay

## 2023-11-27 NOTE — Telephone Encounter (Signed)
 Copied from CRM [id number]. Topic: Clinical - Medical Advice >> Nov 27, 2023  4:47 PM Juleen Oakland F wrote: Reason for CRM: Patient daughter requesting call back regarding patients dementia

## 2023-11-29 NOTE — Telephone Encounter (Signed)
Spoke with patient's daughter. She states dementia is getting worse. The hallucinations are more frequent and upsetting to patient. Pt will yell out the door for husband to come home (husband is deceased). She states medication has been used but they didn't see a difference. Pt's family is wanting to know the next steps. Pt currently lives at home. Pt has care giver twice a week and pt's children are with her the other days. Please advise

## 2023-12-04 NOTE — Telephone Encounter (Signed)
Spoke with patient's daughter. She states that are not interested in these options at this time. She states she was looking for medication for mom's hallucinations

## 2023-12-09 ENCOUNTER — Other Ambulatory Visit: Payer: Self-pay

## 2023-12-09 DIAGNOSIS — F03A2 Unspecified dementia, mild, with psychotic disturbance: Secondary | ICD-10-CM

## 2023-12-09 MED ORDER — QUETIAPINE FUMARATE 25 MG PO TABS: 25.0000 mg | ORAL_TABLET | Freq: Every day | ORAL | 0 refills | Status: DC

## 2023-12-09 NOTE — Telephone Encounter (Signed)
Spoke with patient's daughter. She states she would like to try the new medication before placing a new referral.

## 2023-12-12 DIAGNOSIS — W19XXXA Unspecified fall, initial encounter: Secondary | ICD-10-CM | POA: Diagnosis not present

## 2023-12-12 DIAGNOSIS — R0902 Hypoxemia: Secondary | ICD-10-CM | POA: Diagnosis not present

## 2023-12-12 DIAGNOSIS — I10 Essential (primary) hypertension: Secondary | ICD-10-CM | POA: Diagnosis not present

## 2023-12-25 ENCOUNTER — Encounter: Payer: Self-pay | Admitting: Podiatry

## 2023-12-25 ENCOUNTER — Ambulatory Visit: Payer: Medicare Other | Admitting: Podiatry

## 2023-12-25 VITALS — Ht 63.0 in | Wt 109.0 lb

## 2023-12-25 DIAGNOSIS — M79674 Pain in right toe(s): Secondary | ICD-10-CM

## 2023-12-25 DIAGNOSIS — M79675 Pain in left toe(s): Secondary | ICD-10-CM | POA: Diagnosis not present

## 2023-12-25 DIAGNOSIS — B351 Tinea unguium: Secondary | ICD-10-CM

## 2023-12-25 DIAGNOSIS — M79671 Pain in right foot: Secondary | ICD-10-CM | POA: Diagnosis not present

## 2023-12-25 DIAGNOSIS — L84 Corns and callosities: Secondary | ICD-10-CM | POA: Diagnosis not present

## 2023-12-25 DIAGNOSIS — M79672 Pain in left foot: Secondary | ICD-10-CM | POA: Diagnosis not present

## 2024-01-02 NOTE — Progress Notes (Signed)
  Subjective:  Patient ID: [name] [name], female    DOB: Sep 30, 1927,  MRN: [id number]  89 y.o. female presents preulcerative lesion(s) b/l lower extremities and painful mycotic toenails that limit ambulation. Painful toenails interfere with ambulation. Aggravating factors include wearing enclosed shoe gear. Pain is relieved with periodic professional debridement. Painful preulcerative lesion(s) is/are aggravated when weightbearing with and without shoegear. Pain is relieved with periodic professional debridement. She is accompanied by her daughter on today's visit. Chief Complaint  Patient presents with   RFC    She is here for a nail trim,.    New problem(s): None   PCP is Zola Button, [place] Congress, DO , and last visit was July 02, 2023.  No Known Allergies  Review of Systems: Negative except as noted in the HPI.   Objective:  [name] [name] is a pleasant 89 y.o. female frail, in NAD. AAO x 3.  Vascular Examination: CFT immediate b/l LE. Palpable DP/PT pulses b/l LE. Digital hair present b/l. Skin temperature gradient WNL b/l. No pain with calf compression b/l. No edema noted b/l. No cyanosis or clubbing noted b/l LE.Marland Kitchen  Dermatological Examination: Pedal skin with normal turgor, texture and tone b/l. No open wounds. No interdigital macerations b/l. Toenails 1-5 b/l thickened, discolored, dystrophic with subungual debris. There is pain on palpation to dorsal aspect of nailplates.   Hyperkeratotic lesion(s) left 5th and right 4th toes.  No erythema, no edema, no drainage, no fluctuance. Porokeratotic lesion(s) submet head 1 left foot. No erythema, no edema, no drainage, no fluctuance..  Neurological Examination: Protective sensation intact with 10 gram monofilament b/l LE. Vibratory sensation intact b/l LE.   Musculoskeletal Examination: Muscle strength 5/5 to all lower extremity muscle groups bilaterally. HAV with bunion deformity noted b/l LE. Utilizes transport chair for mobility  assistance.  Radiographs: None  Last A1c:       No data to display           Assessment:   1. Pain due to onychomycosis of toenails of both feet   2. Pre-ulcerative calluses   3. Corns   4. Pain in both feet    Plan:  -Consent given for treatment as described below: -Examined patient. -Mycotic toenails 1-5 bilaterally were debrided in length and girth with sterile nail nippers and dremel without incident. -Corn(s) left fifth digit and right fourth digit debridement with dremel bur without complication or incident. Total number debrided=2. -Preulcerative lesion pared submet head 1 left foot utilizing sterile scalpel blade. Total number pared=1. -Patient/POA to call should there be question/concern in the interim.  Return in about 9 weeks (around 02/26/2024).  [name], DPM      [facility] LOCATION: [address] [place] [address]                   Office [phone]   [facility] LOCATION: [address] [place], [place] [address] Office [phone]

## 2024-01-20 DIAGNOSIS — H6123 Impacted cerumen, bilateral: Secondary | ICD-10-CM | POA: Diagnosis not present

## 2024-02-19 ENCOUNTER — Other Ambulatory Visit: Payer: Self-pay | Admitting: Family Medicine

## 2024-02-19 DIAGNOSIS — I10 Essential (primary) hypertension: Secondary | ICD-10-CM

## 2024-03-04 ENCOUNTER — Ambulatory Visit: Payer: Medicare Other | Admitting: Podiatry

## 2024-03-04 ENCOUNTER — Encounter: Payer: Self-pay | Admitting: Podiatry

## 2024-03-04 DIAGNOSIS — M79674 Pain in right toe(s): Secondary | ICD-10-CM | POA: Diagnosis not present

## 2024-03-04 DIAGNOSIS — M79671 Pain in right foot: Secondary | ICD-10-CM

## 2024-03-04 DIAGNOSIS — B351 Tinea unguium: Secondary | ICD-10-CM | POA: Diagnosis not present

## 2024-03-04 DIAGNOSIS — M79675 Pain in left toe(s): Secondary | ICD-10-CM | POA: Diagnosis not present

## 2024-03-04 DIAGNOSIS — L84 Corns and callosities: Secondary | ICD-10-CM

## 2024-03-04 NOTE — Progress Notes (Signed)
  Subjective:  Patient ID: [name] [name], female    DOB: [birth year],  MRN: [id number]  89 y.o. female presents preulcerative lesion(s) of both feet and painful mycotic toenails that limit ambulation. Painful toenails interfere with ambulation. Aggravating factors include wearing enclosed shoe gear. Pain is relieved with periodic professional debridement. Painful preulcerative lesion(s) is/are aggravated when weightbearing with and without shoegear. Pain is relieved with periodic professional debridement.  New problem(s): None   PCP is [name] [name], [name] Chalk, DO , and last visit was July 02, 2023.  No Known Allergies  Review of Systems: Negative except as noted in the HPI.   Objective:  [name] [name] is a pleasant 89 y.o. female thin build in NAD. AAO x 3.  Vascular Examination: Vascular status intact b/l with palpable pedal pulses. CFT immediate b/l. Pedal hair present. No edema. No pain with calf compression b/l. Skin temperature gradient WNL b/l. No varicosities noted. No cyanosis or clubbing noted.  Neurological Examination: Sensation grossly intact b/l with 10 gram monofilament. Vibratory sensation intact b/l.  Dermatological Examination: Pedal skin with normal turgor, texture and tone b/l. No open wounds nor interdigital macerations noted. Toenails 1-5 b/l thick, discolored, elongated with subungual debris and pain on dorsal palpation. No hyperkeratotic lesions noted b/l.   Musculoskeletal Examination: Muscle strength 5/5 to b/l LE.  No pain, crepitus noted b/l. No gross pedal deformities. Patient ambulates independently without assistive aids.   Radiographs: None  Last A1c:       No data to display           Assessment:   1. Pain due to onychomycosis of toenails of both feet   2. Pre-ulcerative calluses   3. Pain in both feet    Plan:  -Patient was evaluated today. All questions/concerns addressed on today's visit. -Toenails 1-5 b/l were debrided in length and  girth with sterile nail nippers and dremel without iatrogenic bleeding.  -Preulcerative lesion pared submet head 1 left foot utilizing sterile scalpel blade. Total number pared=1. -Patient/POA to call should there be question/concern in the interim. Return in about 3 months (around 06/03/2024).  [name] [name], DPM      [place] LOCATION: [address] [place] [address]                   Office [phone]   [facility] LOCATION: [address] [place], [place] [address] Office [phone]

## 2024-04-27 ENCOUNTER — Encounter (HOSPITAL_BASED_OUTPATIENT_CLINIC_OR_DEPARTMENT_OTHER): Payer: Self-pay | Admitting: Emergency Medicine

## 2024-04-27 ENCOUNTER — Emergency Department (HOSPITAL_BASED_OUTPATIENT_CLINIC_OR_DEPARTMENT_OTHER)
Admission: EM | Admit: 2024-04-27 | Discharge: 2024-04-27 | Disposition: A | Discharge status: Home / Self Care | Attending: Emergency Medicine | Admitting: Emergency Medicine

## 2024-04-27 ENCOUNTER — Emergency Department (HOSPITAL_BASED_OUTPATIENT_CLINIC_OR_DEPARTMENT_OTHER)

## 2024-04-27 ENCOUNTER — Other Ambulatory Visit: Payer: Self-pay

## 2024-04-27 DIAGNOSIS — I10 Essential (primary) hypertension: Secondary | ICD-10-CM | POA: Diagnosis not present

## 2024-04-27 DIAGNOSIS — I3481 Nonrheumatic mitral (valve) annulus calcification: Secondary | ICD-10-CM | POA: Diagnosis not present

## 2024-04-27 DIAGNOSIS — R531 Weakness: Secondary | ICD-10-CM | POA: Insufficient documentation

## 2024-04-27 DIAGNOSIS — E039 Hypothyroidism, unspecified: Secondary | ICD-10-CM | POA: Diagnosis not present

## 2024-04-27 DIAGNOSIS — I771 Stricture of artery: Secondary | ICD-10-CM | POA: Diagnosis not present

## 2024-04-27 DIAGNOSIS — I700 Atherosclerosis of aorta: Secondary | ICD-10-CM | POA: Diagnosis not present

## 2024-04-27 DIAGNOSIS — J90 Pleural effusion, not elsewhere classified: Secondary | ICD-10-CM | POA: Diagnosis not present

## 2024-04-27 DIAGNOSIS — S0990XA Unspecified injury of head, initial encounter: Secondary | ICD-10-CM | POA: Diagnosis not present

## 2024-04-27 DIAGNOSIS — M1711 Unilateral primary osteoarthritis, right knee: Secondary | ICD-10-CM | POA: Diagnosis not present

## 2024-04-27 DIAGNOSIS — Z043 Encounter for examination and observation following other accident: Secondary | ICD-10-CM | POA: Diagnosis not present

## 2024-04-27 LAB — COMPREHENSIVE METABOLIC PANEL WITH GFR
ALT: 16 U/L (ref 0–44)
AST: 21 U/L (ref 15–41)
Albumin: 3.6 g/dL (ref 3.5–5.0)
Alkaline Phosphatase: 75 U/L (ref 38–126)
Anion gap: 9 (ref 5–15)
BUN: 25 mg/dL — ABNORMAL HIGH (ref 8–23)
CO2: 29 mmol/L (ref 22–32)
Calcium: 9.5 mg/dL (ref 8.9–10.3)
Chloride: 105 mmol/L (ref 98–111)
Creatinine, Ser: 0.83 mg/dL (ref 0.44–1.00)
GFR, Estimated: >60 mL/min (ref >60)
Glucose, Bld: 87 mg/dL (ref 70–99)
Potassium: 4.2 mmol/L (ref 3.5–5.1)
Sodium: 142 mmol/L (ref 135–145)
Total Bilirubin: 0.2 mg/dL (ref 0.0–1.2)
Total Protein: 6.1 g/dL — ABNORMAL LOW (ref 6.5–8.1)

## 2024-04-27 LAB — CBC
HCT: 41.2 % (ref 36.0–46.0)
Hemoglobin: 13.4 g/dL (ref 12.0–15.0)
MCH: 30.7 pg (ref 26.0–34.0)
MCHC: 32.5 g/dL (ref 30.0–36.0)
MCV: 94.3 fL (ref 80.0–100.0)
Platelets: 147 10*3/uL — ABNORMAL LOW (ref 150–400)
RBC: 4.37 MIL/uL (ref 3.87–5.11)
RDW: 13.1 % (ref 11.5–15.5)
WBC: 9.1 10*3/uL (ref 4.0–10.5)
nRBC: 0 % (ref 0.0–0.2)

## 2024-04-27 LAB — URINALYSIS, ROUTINE W REFLEX MICROSCOPIC
Bilirubin Urine: NEGATIVE
Glucose, UA: NEGATIVE mg/dL
Ketones, ur: NEGATIVE mg/dL
Leukocytes,Ua: NEGATIVE
Nitrite: NEGATIVE
Protein, ur: NEGATIVE mg/dL
Specific Gravity, Urine: 1.015 (ref 1.005–1.030)
pH: 7 (ref 5.0–8.0)

## 2024-04-27 LAB — URINALYSIS, MICROSCOPIC (REFLEX): WBC, UA: NONE SEEN WBC/hpf (ref 0–5)

## 2024-04-27 LAB — TROPONIN T, HIGH SENSITIVITY
Troponin T High Sensitivity: 34 ng/L — ABNORMAL HIGH (ref <19)
Troponin T High Sensitivity: 35 ng/L — ABNORMAL HIGH (ref <19)

## 2024-04-27 LAB — LIPASE, BLOOD: Lipase: 39 U/L (ref 11–51)

## 2024-04-27 MED ORDER — ACETAMINOPHEN 500 MG PO TABS
1000.0000 mg | ORAL_TABLET | Freq: Once | ORAL | Status: AC
  Filled 2024-04-27: qty 2

## 2024-04-27 MED ORDER — SODIUM CHLORIDE 0.9 % IV BOLUS
500.0000 mL | Freq: Once | INTRAVENOUS | Status: AC
  Administered 2024-04-27: 500 mL via INTRAVENOUS

## 2024-04-27 NOTE — ED Notes (Signed)
 Call to lab to add trop and lipase to blood previously sent to lab

## 2024-04-27 NOTE — ED Provider Notes (Signed)
 Signout from Dr. Dolan Freiberg.  89 year old female brought in by family for increased general weakness few falls.  Also concern for some increased drowsiness and depressed mental status although that seems to be improved since stopping her Zoloft  which was a new medication.  Workup so far has been fairly unremarkable.  She is pending a repeat troponin and a urinalysis.  Dr. Dolan Freiberg has said her for outpatient physical therapy services.  Family comfortable taking patient home. Physical Exam  BP (!) 147/58 (BP Location: Left Arm)   Pulse (!) 58   Temp 97.7 F (36.5 C) (Oral)   Resp 18   Ht 5' 3 (1.6 m)   Wt 49.9 kg   SpO2 99%   BMI 19.49 kg/m   Physical Exam  Procedures  Procedures  ED Course / MDM    Medical Decision Making Amount and/or Complexity of Data Reviewed Labs: ordered. Radiology: ordered.  Risk OTC drugs.   Patient's repeat troponin and urinalysis are unremarkable.  Reviewed with patient and daughter.  She is comfortable with taking her home and following up with PCP.  Return instructions discussed       Tonya Fredrickson, MD 04/28/24 [phone]

## 2024-04-27 NOTE — ED Notes (Signed)
Patient transported to X-ray and CT 

## 2024-04-27 NOTE — ED Provider Notes (Signed)
 Emergency Department Provider Note   I have reviewed the triage vital signs and the nursing notes.   HISTORY  Chief Complaint Altered Mental Status   HPI [name] [name] is a 89 y.o. female with past history of hypertension hyperlipidemia presents to the emergency department with generalized weakness and sudden decreased mobility.  Much of the history is provided by the patient's daughter at bedside who states that she has had 2 falls in the past couple of days.  She is typically ambulatory with a walker and fairly independent.  She is at home where people are always with her.  She has had some unwitnessed falls in the past few days in the setting of starting sertraline .  She took 5 doses of this medication and seemed to be more drowsy.  This was discontinued yesterday and her mental status has improved significantly but her mobility continues to seem decreased.  The patient denies any painful areas to family.  They have not noticed any fevers.  She seems somewhat more shaky with trying to stand in the stated home they have essentially been having her sit on her rollator and put her around the house.   Past Medical History:  Diagnosis Date   Arthritis    right thumb (09/05/2015)   Hyperlipidemia    Hypertension    Hypothyroidism    Osteoporosis     Review of Systems  Constitutional: No fever/chills Cardiovascular: Denies chest pain. Respiratory: Denies shortness of breath. Gastrointestinal: No abdominal pain.  No nausea, no vomiting.  Musculoskeletal: Positive right leg pain.  Skin: Negative for rash. Neurological: Negative for headaches.   ____________________________________________   PHYSICAL EXAM:  VITAL SIGNS: ED Triage Vitals  Encounter Vitals Group     BP 04/27/24 1242 (!) 147/58     Pulse Rate 04/27/24 1242 (!) 58     Resp 04/27/24 1242 18     Temp 04/27/24 1242 97.7 F (36.5 C)     Temp Source 04/27/24 1242 Oral     SpO2 04/27/24 1242 99 %     Weight  04/27/24 1247 110 lb (49.9 kg)     Height 04/27/24 1247 5' 3 (1.6 m)   Constitutional: Alert. Well appearing and in no acute distress. Eyes: Conjunctivae are normal. PERRL.  Head: Atraumatic. Nose: No congestion/rhinnorhea. Mouth/Throat: Mucous membranes are moist.  Oropharynx non-erythematous. Neck: No stridor. No cervical spine tenderness to palpation. Cardiovascular: Normal rate, regular rhythm. Good peripheral circulation. Grossly normal heart sounds.   Respiratory: Normal respiratory effort.  No retractions. Lungs CTAB. Gastrointestinal: Soft and nontender. No distention.  Musculoskeletal: No lower extremity tenderness nor edema. No gross deformities of extremities.  Normal passive range of motion of the bilateral upper and lower extremities including the hips and right knee.  Neurologic:  Normal speech and language. No gross focal neurologic deficits are appreciated.  Skin:  Skin is warm, dry and intact. No rash noted.  ____________________________________________   LABS (all labs ordered are listed, but only abnormal results are displayed)  Labs Reviewed  COMPREHENSIVE METABOLIC PANEL WITH GFR - Abnormal; Notable for the following components:      Result Value   BUN 25 (*)    Total Protein 6.1 (*)    All other components within normal limits  CBC - Abnormal; Notable for the following components:   Platelets 147 (*)    All other components within normal limits  URINALYSIS, ROUTINE W REFLEX MICROSCOPIC - Abnormal; Notable for the following components:   Hgb urine  dipstick TRACE (*)    All other components within normal limits  URINALYSIS, MICROSCOPIC (REFLEX) - Abnormal; Notable for the following components:   Bacteria, UA RARE (*)    All other components within normal limits  TROPONIN T, HIGH SENSITIVITY - Abnormal; Notable for the following components:   Troponin T High Sensitivity 34 (*)    All other components within normal limits  TROPONIN T, HIGH SENSITIVITY -  Abnormal; Notable for the following components:   Troponin T High Sensitivity 35 (*)    All other components within normal limits  LIPASE, BLOOD   ____________________________________________  EKG   EKG Interpretation Date/Time:  Monday April 27 2024 13:01:48 EDT Ventricular Rate:  60 PR Interval:  158 QRS Duration:  117 QT Interval:  443 QTC Calculation: 443 R Axis:   267  Text Interpretation: Sinus rhythm Incomplete RBBB and LAFB Confirmed by Abby Hocking [phone]) on 04/27/2024 2:19:07 PM        ____________________________________________  RADIOLOGY  DG Knee Complete 4 Views Right Result Date: 04/27/2024 CLINICAL DATA:  Status post fall EXAM: RIGHT KNEE - COMPLETE 4+ VIEW COMPARISON:  None Available. FINDINGS: Advanced tricompartmental osteoarthrosis without fractures. Marginal osteophytes as well as calcifications of the menisci consistent with chondrocalcinosis. IMPRESSION: Advanced tricompartmental osteoarthrosis without fractures. Electronically Signed   By: Fredrich Jefferson M.D.   On: 04/27/2024 15:21   DG Pelvis 1-2 Views Result Date: 04/27/2024 CLINICAL DATA:  Status post fall EXAM: PELVIS - 1-2 VIEW COMPARISON:  None Available. FINDINGS: There is no evidence of pelvic fracture or diastasis. No pelvic bone lesions are seen. IMPRESSION: Negative. Electronically Signed   By: Fredrich Jefferson M.D.   On: 04/27/2024 15:21   DG Chest 2 View Result Date: 04/27/2024 CLINICAL DATA:  Found down. EXAM: CHEST - 2 VIEW COMPARISON:  Chest x-ray 06/02/2020. FINDINGS: Hyperinflation. Film is rotated to the left. No consolidation, pneumothorax. Tiny pleural effusions seen on lateral view bilaterally. Diffuse interstitial changes are seen which is likely chronic. Calcified and tortuous aorta. Normal cardiopericardial silhouette when adjusted for rotation. Prominent calcifications along the mitral valve annulus. Overlapping cardiac leads. Osteopenia and degenerative changes. IMPRESSION:  Hyperinflation with chronic changes.  Tiny pleural effusions. Electronically Signed   By: Adrianna Horde M.D.   On: 04/27/2024 15:13   CT Head Wo Contrast Result Date: 04/27/2024 CLINICAL DATA:  Head trauma, minor (Age >= 65y).  Found down. EXAM: CT HEAD WITHOUT CONTRAST TECHNIQUE: Contiguous axial images were obtained from the base of the skull through the vertex without intravenous contrast. RADIATION DOSE REDUCTION: This exam was performed according to the departmental dose-optimization program which includes automated exposure control, adjustment of the mA and/or kV according to patient size and/or use of iterative reconstruction technique. COMPARISON:  08/27/2023 FINDINGS: Brain: There is atrophy and chronic small vessel disease changes. No acute intracranial abnormality. Specifically, no hemorrhage, hydrocephalus, mass lesion, acute infarction, or significant intracranial injury. Vascular: No hyperdense vessel or unexpected calcification. Skull: No acute calvarial abnormality. Sinuses/Orbits: No acute findings Other: None IMPRESSION: Atrophy, chronic microvascular disease. No acute intracranial abnormality. Electronically Signed   By: Janeece Mechanic M.D.   On: 04/27/2024 14:37    ____________________________________________   PROCEDURES  Procedure(s) performed:   Procedures  None  ____________________________________________   INITIAL IMPRESSION / ASSESSMENT AND PLAN / ED COURSE  Pertinent labs & imaging results that were available during my care of the patient were reviewed by me and considered in my medical decision making (see chart for details).  This patient is Presenting for Evaluation of weakness, which does require a range of treatment options, and is a complaint that involves a high risk of morbidity and mortality.  The Differential Diagnoses include drug reaction, dehydration, UTI, ACS, trauma, etc.  Critical Interventions-    Medications  sodium chloride  0.9 % bolus 500 mL  (0 mLs Intravenous Stopped 04/27/24 1516)  acetaminophen  (TYLENOL ) tablet 1,000 mg (1,000 mg Oral Given 04/27/24 1527)    Reassessment after intervention:  symptoms improved.    I did obtain Additional Historical Information from daughter at bedside.    Clinical Laboratory Tests Ordered, included CBC without anemia or leukocytosis. Troponin mildly elevated to 34. CMP without AKI.   Radiologic Tests Ordered, included CT head. I independently interpreted the images and agree with radiology interpretation.   Cardiac Monitor Tracing which shows NSR.    Social Determinants of Health Risk patient is a non-smoker.   Medical Decision Making: Summary:  Patient presents emergency department with weakness and multiple falls.  No focal deficits on exam to strongly suspect stroke.  She does not have any obvious traumatic injuries.  Some of her drowsiness may have been caused by starting sertraline  which was discontinued and her mental status has cleared significantly today.  Plan for screening blood work along with UA and reassess.   Reevaluation with update and discussion with patient and daughter at bedside.  She is feeling better after IV fluids.  Some additional imaging and UA/repeat troponin pending.  Care transferred to Dr. Randal Bury.    Patient's presentation is most consistent with acute presentation with potential threat to life or bodily function.   Disposition: likely discharge   ____________________________________________  FINAL CLINICAL IMPRESSION(S) / ED DIAGNOSES  Final diagnoses:  General weakness    Note:  This document was prepared using Dragon voice recognition software and may include unintentional dictation errors.  Abby Hocking, MD, [facility] Emergency Medicine    Zaylei Mullane, Shereen Dike, MD 04/28/24 [phone]

## 2024-04-27 NOTE — ED Triage Notes (Signed)
 Pt POV in wheelchair- pt found sitting in floor Saturday when family arrived.  Daughter reports pt has been more shaky since event, pt acting out of normal, sleepy, difficulty with ambulation.    Pt normally walks independently with walker at baseline. Daughter reports foul smell urine last week.   Pt c/o R leg pain with passive ROM

## 2024-04-30 DIAGNOSIS — M80051D Age-related osteoporosis with current pathological fracture, right femur, subsequent encounter for fracture with routine healing: Secondary | ICD-10-CM | POA: Diagnosis not present

## 2024-04-30 DIAGNOSIS — R32 Unspecified urinary incontinence: Secondary | ICD-10-CM | POA: Diagnosis not present

## 2024-04-30 DIAGNOSIS — F02A18 Dementia in other diseases classified elsewhere, mild, with other behavioral disturbance: Secondary | ICD-10-CM | POA: Diagnosis not present

## 2024-04-30 DIAGNOSIS — E860 Dehydration: Secondary | ICD-10-CM | POA: Diagnosis not present

## 2024-04-30 DIAGNOSIS — I10 Essential (primary) hypertension: Secondary | ICD-10-CM | POA: Diagnosis not present

## 2024-04-30 DIAGNOSIS — F32A Depression, unspecified: Secondary | ICD-10-CM | POA: Diagnosis not present

## 2024-04-30 DIAGNOSIS — Z9181 History of falling: Secondary | ICD-10-CM | POA: Diagnosis not present

## 2024-04-30 DIAGNOSIS — R296 Repeated falls: Secondary | ICD-10-CM | POA: Diagnosis not present

## 2024-04-30 DIAGNOSIS — Z961 Presence of intraocular lens: Secondary | ICD-10-CM | POA: Diagnosis not present

## 2024-04-30 DIAGNOSIS — F02A3 Dementia in other diseases classified elsewhere, mild, with mood disturbance: Secondary | ICD-10-CM | POA: Diagnosis not present

## 2024-04-30 DIAGNOSIS — M1811 Unilateral primary osteoarthritis of first carpometacarpal joint, right hand: Secondary | ICD-10-CM | POA: Diagnosis not present

## 2024-04-30 DIAGNOSIS — F02A2 Dementia in other diseases classified elsewhere, mild, with psychotic disturbance: Secondary | ICD-10-CM | POA: Diagnosis not present

## 2024-04-30 DIAGNOSIS — T43225D Adverse effect of selective serotonin reuptake inhibitors, subsequent encounter: Secondary | ICD-10-CM | POA: Diagnosis not present

## 2024-04-30 DIAGNOSIS — F05 Delirium due to known physiological condition: Secondary | ICD-10-CM | POA: Diagnosis not present

## 2024-04-30 DIAGNOSIS — Z79899 Other long term (current) drug therapy: Secondary | ICD-10-CM | POA: Diagnosis not present

## 2024-04-30 DIAGNOSIS — F02A11 Dementia in other diseases classified elsewhere, mild, with agitation: Secondary | ICD-10-CM | POA: Diagnosis not present

## 2024-04-30 DIAGNOSIS — E039 Hypothyroidism, unspecified: Secondary | ICD-10-CM | POA: Diagnosis not present

## 2024-04-30 DIAGNOSIS — M1711 Unilateral primary osteoarthritis, right knee: Secondary | ICD-10-CM | POA: Diagnosis not present

## 2024-04-30 DIAGNOSIS — E785 Hyperlipidemia, unspecified: Secondary | ICD-10-CM | POA: Diagnosis not present

## 2024-05-04 ENCOUNTER — Inpatient Hospital Stay: Admitting: Family Medicine

## 2024-05-04 DIAGNOSIS — R296 Repeated falls: Secondary | ICD-10-CM | POA: Diagnosis not present

## 2024-05-04 DIAGNOSIS — M80051D Age-related osteoporosis with current pathological fracture, right femur, subsequent encounter for fracture with routine healing: Secondary | ICD-10-CM | POA: Diagnosis not present

## 2024-05-04 DIAGNOSIS — M1711 Unilateral primary osteoarthritis, right knee: Secondary | ICD-10-CM | POA: Diagnosis not present

## 2024-05-04 DIAGNOSIS — E039 Hypothyroidism, unspecified: Secondary | ICD-10-CM | POA: Diagnosis not present

## 2024-05-04 DIAGNOSIS — F32A Depression, unspecified: Secondary | ICD-10-CM | POA: Diagnosis not present

## 2024-05-04 DIAGNOSIS — F02A11 Dementia in other diseases classified elsewhere, mild, with agitation: Secondary | ICD-10-CM | POA: Diagnosis not present

## 2024-05-04 DIAGNOSIS — F02A3 Dementia in other diseases classified elsewhere, mild, with mood disturbance: Secondary | ICD-10-CM | POA: Diagnosis not present

## 2024-05-04 DIAGNOSIS — I10 Essential (primary) hypertension: Secondary | ICD-10-CM | POA: Diagnosis not present

## 2024-05-04 DIAGNOSIS — E785 Hyperlipidemia, unspecified: Secondary | ICD-10-CM | POA: Diagnosis not present

## 2024-05-04 DIAGNOSIS — Z9181 History of falling: Secondary | ICD-10-CM | POA: Diagnosis not present

## 2024-05-04 DIAGNOSIS — E860 Dehydration: Secondary | ICD-10-CM | POA: Diagnosis not present

## 2024-05-04 DIAGNOSIS — Z961 Presence of intraocular lens: Secondary | ICD-10-CM | POA: Diagnosis not present

## 2024-05-04 DIAGNOSIS — F05 Delirium due to known physiological condition: Secondary | ICD-10-CM | POA: Diagnosis not present

## 2024-05-04 DIAGNOSIS — R32 Unspecified urinary incontinence: Secondary | ICD-10-CM | POA: Diagnosis not present

## 2024-05-04 DIAGNOSIS — M1811 Unilateral primary osteoarthritis of first carpometacarpal joint, right hand: Secondary | ICD-10-CM | POA: Diagnosis not present

## 2024-05-04 DIAGNOSIS — F02A18 Dementia in other diseases classified elsewhere, mild, with other behavioral disturbance: Secondary | ICD-10-CM | POA: Diagnosis not present

## 2024-05-04 DIAGNOSIS — Z79899 Other long term (current) drug therapy: Secondary | ICD-10-CM | POA: Diagnosis not present

## 2024-05-04 DIAGNOSIS — F02A2 Dementia in other diseases classified elsewhere, mild, with psychotic disturbance: Secondary | ICD-10-CM | POA: Diagnosis not present

## 2024-05-04 DIAGNOSIS — T43225D Adverse effect of selective serotonin reuptake inhibitors, subsequent encounter: Secondary | ICD-10-CM | POA: Diagnosis not present

## 2024-05-05 ENCOUNTER — Ambulatory Visit (INDEPENDENT_AMBULATORY_CARE_PROVIDER_SITE_OTHER): Payer: Medicare Other | Admitting: Podiatry

## 2024-05-05 DIAGNOSIS — Z91198 Patient's noncompliance with other medical treatment and regimen for other reason: Secondary | ICD-10-CM

## 2024-05-06 DIAGNOSIS — R32 Unspecified urinary incontinence: Secondary | ICD-10-CM | POA: Diagnosis not present

## 2024-05-06 DIAGNOSIS — R296 Repeated falls: Secondary | ICD-10-CM | POA: Diagnosis not present

## 2024-05-06 DIAGNOSIS — M80051D Age-related osteoporosis with current pathological fracture, right femur, subsequent encounter for fracture with routine healing: Secondary | ICD-10-CM | POA: Diagnosis not present

## 2024-05-06 DIAGNOSIS — F32A Depression, unspecified: Secondary | ICD-10-CM | POA: Diagnosis not present

## 2024-05-06 DIAGNOSIS — E039 Hypothyroidism, unspecified: Secondary | ICD-10-CM | POA: Diagnosis not present

## 2024-05-06 DIAGNOSIS — F02A18 Dementia in other diseases classified elsewhere, mild, with other behavioral disturbance: Secondary | ICD-10-CM | POA: Diagnosis not present

## 2024-05-06 DIAGNOSIS — E860 Dehydration: Secondary | ICD-10-CM | POA: Diagnosis not present

## 2024-05-06 DIAGNOSIS — E785 Hyperlipidemia, unspecified: Secondary | ICD-10-CM | POA: Diagnosis not present

## 2024-05-06 DIAGNOSIS — F02A3 Dementia in other diseases classified elsewhere, mild, with mood disturbance: Secondary | ICD-10-CM | POA: Diagnosis not present

## 2024-05-06 DIAGNOSIS — M1811 Unilateral primary osteoarthritis of first carpometacarpal joint, right hand: Secondary | ICD-10-CM | POA: Diagnosis not present

## 2024-05-06 DIAGNOSIS — Z9181 History of falling: Secondary | ICD-10-CM | POA: Diagnosis not present

## 2024-05-06 DIAGNOSIS — F02A11 Dementia in other diseases classified elsewhere, mild, with agitation: Secondary | ICD-10-CM | POA: Diagnosis not present

## 2024-05-06 DIAGNOSIS — I10 Essential (primary) hypertension: Secondary | ICD-10-CM | POA: Diagnosis not present

## 2024-05-06 DIAGNOSIS — F02A2 Dementia in other diseases classified elsewhere, mild, with psychotic disturbance: Secondary | ICD-10-CM | POA: Diagnosis not present

## 2024-05-06 DIAGNOSIS — Z961 Presence of intraocular lens: Secondary | ICD-10-CM | POA: Diagnosis not present

## 2024-05-06 DIAGNOSIS — M1711 Unilateral primary osteoarthritis, right knee: Secondary | ICD-10-CM | POA: Diagnosis not present

## 2024-05-06 DIAGNOSIS — Z79899 Other long term (current) drug therapy: Secondary | ICD-10-CM | POA: Diagnosis not present

## 2024-05-06 DIAGNOSIS — F05 Delirium due to known physiological condition: Secondary | ICD-10-CM | POA: Diagnosis not present

## 2024-05-06 DIAGNOSIS — T43225D Adverse effect of selective serotonin reuptake inhibitors, subsequent encounter: Secondary | ICD-10-CM | POA: Diagnosis not present

## 2024-05-07 ENCOUNTER — Telehealth: Payer: Self-pay

## 2024-05-07 DIAGNOSIS — E785 Hyperlipidemia, unspecified: Secondary | ICD-10-CM | POA: Diagnosis not present

## 2024-05-07 DIAGNOSIS — F02A11 Dementia in other diseases classified elsewhere, mild, with agitation: Secondary | ICD-10-CM | POA: Diagnosis not present

## 2024-05-07 DIAGNOSIS — I10 Essential (primary) hypertension: Secondary | ICD-10-CM | POA: Diagnosis not present

## 2024-05-07 DIAGNOSIS — T43225D Adverse effect of selective serotonin reuptake inhibitors, subsequent encounter: Secondary | ICD-10-CM | POA: Diagnosis not present

## 2024-05-07 DIAGNOSIS — E860 Dehydration: Secondary | ICD-10-CM | POA: Diagnosis not present

## 2024-05-07 DIAGNOSIS — F02A3 Dementia in other diseases classified elsewhere, mild, with mood disturbance: Secondary | ICD-10-CM | POA: Diagnosis not present

## 2024-05-07 DIAGNOSIS — F05 Delirium due to known physiological condition: Secondary | ICD-10-CM | POA: Diagnosis not present

## 2024-05-07 DIAGNOSIS — M1811 Unilateral primary osteoarthritis of first carpometacarpal joint, right hand: Secondary | ICD-10-CM | POA: Diagnosis not present

## 2024-05-07 DIAGNOSIS — M1711 Unilateral primary osteoarthritis, right knee: Secondary | ICD-10-CM | POA: Diagnosis not present

## 2024-05-07 DIAGNOSIS — F02A2 Dementia in other diseases classified elsewhere, mild, with psychotic disturbance: Secondary | ICD-10-CM | POA: Diagnosis not present

## 2024-05-07 DIAGNOSIS — F02A18 Dementia in other diseases classified elsewhere, mild, with other behavioral disturbance: Secondary | ICD-10-CM | POA: Diagnosis not present

## 2024-05-07 DIAGNOSIS — Z79899 Other long term (current) drug therapy: Secondary | ICD-10-CM | POA: Diagnosis not present

## 2024-05-07 DIAGNOSIS — M80051D Age-related osteoporosis with current pathological fracture, right femur, subsequent encounter for fracture with routine healing: Secondary | ICD-10-CM | POA: Diagnosis not present

## 2024-05-07 DIAGNOSIS — Z961 Presence of intraocular lens: Secondary | ICD-10-CM | POA: Diagnosis not present

## 2024-05-07 DIAGNOSIS — R32 Unspecified urinary incontinence: Secondary | ICD-10-CM | POA: Diagnosis not present

## 2024-05-07 DIAGNOSIS — F32A Depression, unspecified: Secondary | ICD-10-CM | POA: Diagnosis not present

## 2024-05-07 DIAGNOSIS — R296 Repeated falls: Secondary | ICD-10-CM | POA: Diagnosis not present

## 2024-05-07 DIAGNOSIS — E039 Hypothyroidism, unspecified: Secondary | ICD-10-CM | POA: Diagnosis not present

## 2024-05-07 DIAGNOSIS — Z9181 History of falling: Secondary | ICD-10-CM | POA: Diagnosis not present

## 2024-05-07 NOTE — Telephone Encounter (Signed)
 Copied from CRM [id number]. Topic: Clinical - Home Health Verbal Orders >> May 07, 2024 11:45 AM Burnard DEL wrote: Caller/Agency: [facility] Callback Number: [phone] Service Requested: Physical Therapy Frequency: 1wk1 2week3 1wk4 Any new concerns about the patient? Complaining of chronic pain in right leg 4out of 10 pain Taking tylenol  extra strength

## 2024-05-07 NOTE — Telephone Encounter (Signed)
 Copied from CRM [id number]. Topic: General - Other >> May 07, 2024 12:25 PM Mesmerise C wrote: Reason for CRM: Dorthea from [facility] stated patient was seen at hospital and needing to confirm which diagnosis to use can't see what the underlying diagnosis is callback number is [phone] VM is confidential

## 2024-05-07 NOTE — Telephone Encounter (Signed)
 Verbal given

## 2024-05-08 NOTE — Progress Notes (Signed)
 1. Failure to attend appointment with reason given    Appointment canceled by provider.

## 2024-05-11 ENCOUNTER — Telehealth (INDEPENDENT_AMBULATORY_CARE_PROVIDER_SITE_OTHER): Admitting: Family Medicine

## 2024-05-11 DIAGNOSIS — E039 Hypothyroidism, unspecified: Secondary | ICD-10-CM

## 2024-05-11 DIAGNOSIS — I10 Essential (primary) hypertension: Secondary | ICD-10-CM

## 2024-05-11 DIAGNOSIS — R413 Other amnesia: Secondary | ICD-10-CM | POA: Diagnosis not present

## 2024-05-11 DIAGNOSIS — F03A2 Unspecified dementia, mild, with psychotic disturbance: Secondary | ICD-10-CM

## 2024-05-11 NOTE — Assessment & Plan Note (Signed)
Check labs  Con't synthroid 

## 2024-05-11 NOTE — Progress Notes (Signed)
 MyChart Video Visit    Virtual Visit via Video Note   This patient is at least at moderate risk for complications without adequate follow up. This format is felt to be most appropriate for this patient at this time. Physical exam was limited by quality of the video and audio technology used for the visit. Powell was able to get the patient set up on a video visit.  Patient location: home with daughter  Patient and provider in visit Provider location: Office  I discussed the limitations of evaluation and management by telemedicine and the availability of in person appointments. The patient expressed understanding and agreed to proceed.  Visit Date: 05/11/2024  Today's healthcare provider: Jamee JONELLE Antonio Cyndee, DO     Subjective:    Patient ID: [name] [name], female    DOB: 1927/02/21, 89 y.o.   MRN: [id number]  Chief Complaint  Patient presents with  . ED follow up    HPI Patient is in today for ed f/u.  Discussed the use of AI scribe software for clinical note transcription with the patient, who gave verbal consent to proceed.  History of Present Illness [name] [name] is a 89 year old female who presents for follow-up after a recent fall and memory issues.  She experienced a fall on Saturday, June 14th, and was taken to the emergency room on June 16th due to her inability to walk. She did not walk on the 14th or 15th, and her condition did not improve significantly by the 16th. At the emergency room, she received IV fluids for dehydration.  She continues to experience significant memory issues, including not remembering the fall or the emergency room visit. Her daughter reports that she does not remember if she has eaten or had coffee, and she frequently calls her daughter without recalling these calls. These memory issues have been more pronounced recently, with her daughter noting that she does not remember events from the morning by the afternoon.  She has a history of  depression and was previously on sertraline , which was discontinued due to excessive drowsiness and inability to keep her eyelids open. After stopping sertraline , her condition improved, but she remains depressed, often expressing a desire to cry and missing her deceased husband, whom she sometimes believes is still alive.  She is currently on Synthroid , and there is a need to recheck her thyroid  levels. She has enough Synthroid  at present.  She is able to perform daily activities such as dressing, using the bathroom, and getting in and out of bed independently. She uses a walker for mobility but sometimes forgets where it is.    Past Medical History:  Diagnosis Date  . Arthritis    right thumb (09/05/2015)  . Hyperlipidemia   . Hypertension   . Hypothyroidism   . Osteoporosis     Past Surgical History:  Procedure Laterality Date  . CATARACT EXTRACTION W/ INTRAOCULAR LENS  IMPLANT, BILATERAL Bilateral 2010  . CLOSED REDUCTION SHOULDER DISLOCATION Right   . OVARIAN CYST SURGERY  1949  . TONSILLECTOMY      Family History  Problem Relation Age of Onset  . Cancer Mother        bladder  . Cancer Father        unknown  . Ovarian cancer Sister   . Heart disease Brother        All 5 brothers have had heart issues starting in their 56s  . Breast cancer Daughter  Social History   Socioeconomic History  . Marital status: Widowed    Spouse name: Not on file  . Number of children: Not on file  . Years of education: Not on file  . Highest education level: Not on file  Occupational History  . Not on file  Tobacco Use  . Smoking status: Never  . Smokeless tobacco: Never  Substance and Sexual Activity  . Alcohol use: No  . Drug use: No  . Sexual activity: Yes    Partners: Male  Other Topics Concern  . Not on file  Social History Narrative   Lives with her husband in a house    2 children close by to help   Social Drivers of Health   Financial Resource Strain: Low  Risk  (03/16/2022)   Overall Financial Resource Strain (CARDIA)   . Difficulty of Paying Living Expenses: Not hard at all  Food Insecurity: No Food Insecurity (03/16/2022)   Hunger Vital Sign   . Worried About Programme researcher, broadcasting/film/video in the Last Year: Never true   . Ran Out of Food in the Last Year: Never true  Transportation Needs: No Transportation Needs (12/08/2021)   PRAPARE - Transportation   . Lack of Transportation (Medical): No   . Lack of Transportation (Non-Medical): No  Physical Activity: Insufficiently Active (03/16/2022)   Exercise Vital Sign   . Days of Exercise per Week: 3 days   . Minutes of Exercise per Session: 10 min  Stress: Stress Concern Present (03/16/2022)   [employer] of Occupational Health - Occupational Stress Questionnaire   . Feeling of Stress : Rather much  Social Connections: Not on file  Intimate Partner Violence: Not on file    Outpatient Medications Prior to Visit  Medication Sig Dispense Refill  . calcium carbonate (OSCAL) 1500 (600 Ca) MG TABS tablet Take 1 tablet by mouth daily.    . levothyroxine  (SYNTHROID ) 125 MCG tablet TAKE ONE TABLET ( TOTAL) BY MOUTH DAILY 90 tablet 1  . lisinopril  (ZESTRIL ) 20 MG tablet TAKE ONE TABLET BY MOUTH ONCE DAILY 90 tablet 0  . Multiple Vitamins-Minerals (ICAPS AREDS 2 PO) Take 1 tablet by mouth in the morning and at bedtime.    . QUEtiapine  (SEROQUEL ) 25 MG tablet Take 1 tablet (25 mg total) by mouth at bedtime. 30 tablet 0  . sertraline  (ZOLOFT ) 25 MG tablet TAKE ONE TABLET BY MOUTH ONCE DAILY 30 tablet 2   No facility-administered medications prior to visit.    No Known Allergies  Review of Systems  Constitutional:  Positive for malaise/fatigue. Negative for fever.  HENT:  Negative for congestion.   Eyes:  Negative for blurred vision.  Respiratory:  Negative for shortness of breath.   Cardiovascular:  Negative for chest pain, palpitations and leg swelling.  Gastrointestinal:  Negative for abdominal  pain, blood in stool and nausea.  Genitourinary:  Negative for dysuria and frequency.  Musculoskeletal:  Negative for falls.  Skin:  Negative for rash.  Neurological:  Negative for dizziness, loss of consciousness and headaches.  Endo/Heme/Allergies:  Negative for environmental allergies.  Psychiatric/Behavioral:  Positive for memory loss. Negative for depression. The patient is not nervous/anxious.       Objective:    Physical Exam  Neurological:     Mental Status: Mental status is at baseline.   Psychiatric:        Mood and Affect: Mood is depressed.        Cognition and Memory: Cognition is impaired. Memory  is impaired.     Comments: Pt daughter states she has been depressed but the medication made her worse so they prefer she not be on any meds    There were no vitals taken for this visit. Wt Readings from Last 3 Encounters:  04/27/24 110 lb (49.9 kg)  12/25/23 109 lb (49.4 kg)  10/16/23 109 lb (49.4 kg)       Assessment & Plan:  Memory change -     Ambulatory referral to Neurology  Mild dementia with psychotic disturbance, unspecified dementia type (HCC)  Essential hypertension  Hypothyroidism, unspecified type Assessment & Plan: Check labs  Con't synthroid     Assessment and Plan Assessment & Plan Memory Impairment   She is experiencing significant memory problems, affecting her daily life, including not remembering recent events like falling, going to the ER, or eating meals. Although there is no cure, interventions may slow progression. A referral to a neurologist for further evaluation is planned.  Dehydration   Dehydration likely contributed to her recent fall and hospitalization. IV fluids in the ER improved her condition. Maintaining hydration is crucial to prevent symptom exacerbation.  Fall Risk   She is at increased risk of falls, as evidenced by her recent fall and hospitalization. Home modifications, such as bars and a lowered bed, are in place to  reduce this risk. Continuing physical and occupational therapy is crucial for improving mobility and safety.  Depression   She was previously on sertraline , which caused excessive drowsiness and contributed to a fall. Treating depression in older adults is challenging due to medication side effects. Given her current condition, the decision was made to hold off on restarting antidepressants and focus on non-pharmacological support.  Hypothyroidism   Her thyroid  function needs re-evaluation as it was not checked during her recent hospital visit. She is currently on Synthroid  with an adequate supply. A thyroid  panel will be ordered through [facility] for a home blood draw to ensure proper management.    I discussed the assessment and treatment plan with the patient. The patient was provided an opportunity to ask questions and all were answered. The patient agreed with the plan and demonstrated an understanding of the instructions.   The patient was advised to call back or seek an in-person evaluation if the symptoms worsen or if the condition fails to improve as anticipated.  Jamee JONELLE Antonio Cyndee, DO [place]  [facility] at [facility] [phone] (phone) [phone] (fax)  [facility]

## 2024-05-12 DIAGNOSIS — M1811 Unilateral primary osteoarthritis of first carpometacarpal joint, right hand: Secondary | ICD-10-CM | POA: Diagnosis not present

## 2024-05-12 DIAGNOSIS — E785 Hyperlipidemia, unspecified: Secondary | ICD-10-CM | POA: Diagnosis not present

## 2024-05-12 DIAGNOSIS — Z9181 History of falling: Secondary | ICD-10-CM | POA: Diagnosis not present

## 2024-05-12 DIAGNOSIS — F02A18 Dementia in other diseases classified elsewhere, mild, with other behavioral disturbance: Secondary | ICD-10-CM | POA: Diagnosis not present

## 2024-05-12 DIAGNOSIS — F02A11 Dementia in other diseases classified elsewhere, mild, with agitation: Secondary | ICD-10-CM | POA: Diagnosis not present

## 2024-05-12 DIAGNOSIS — M80051D Age-related osteoporosis with current pathological fracture, right femur, subsequent encounter for fracture with routine healing: Secondary | ICD-10-CM | POA: Diagnosis not present

## 2024-05-12 DIAGNOSIS — M1711 Unilateral primary osteoarthritis, right knee: Secondary | ICD-10-CM | POA: Diagnosis not present

## 2024-05-12 DIAGNOSIS — F02A2 Dementia in other diseases classified elsewhere, mild, with psychotic disturbance: Secondary | ICD-10-CM | POA: Diagnosis not present

## 2024-05-12 DIAGNOSIS — T43225D Adverse effect of selective serotonin reuptake inhibitors, subsequent encounter: Secondary | ICD-10-CM | POA: Diagnosis not present

## 2024-05-12 DIAGNOSIS — F02A3 Dementia in other diseases classified elsewhere, mild, with mood disturbance: Secondary | ICD-10-CM | POA: Diagnosis not present

## 2024-05-12 DIAGNOSIS — E039 Hypothyroidism, unspecified: Secondary | ICD-10-CM | POA: Diagnosis not present

## 2024-05-12 DIAGNOSIS — E860 Dehydration: Secondary | ICD-10-CM | POA: Diagnosis not present

## 2024-05-12 DIAGNOSIS — Z961 Presence of intraocular lens: Secondary | ICD-10-CM | POA: Diagnosis not present

## 2024-05-12 DIAGNOSIS — F05 Delirium due to known physiological condition: Secondary | ICD-10-CM | POA: Diagnosis not present

## 2024-05-12 DIAGNOSIS — I10 Essential (primary) hypertension: Secondary | ICD-10-CM | POA: Diagnosis not present

## 2024-05-12 DIAGNOSIS — F32A Depression, unspecified: Secondary | ICD-10-CM | POA: Diagnosis not present

## 2024-05-12 DIAGNOSIS — R32 Unspecified urinary incontinence: Secondary | ICD-10-CM | POA: Diagnosis not present

## 2024-05-12 DIAGNOSIS — R296 Repeated falls: Secondary | ICD-10-CM | POA: Diagnosis not present

## 2024-05-12 DIAGNOSIS — Z79899 Other long term (current) drug therapy: Secondary | ICD-10-CM | POA: Diagnosis not present

## 2024-05-16 ENCOUNTER — Encounter (HOSPITAL_COMMUNITY): Payer: Self-pay

## 2024-05-16 ENCOUNTER — Emergency Department (HOSPITAL_COMMUNITY)

## 2024-05-16 ENCOUNTER — Inpatient Hospital Stay (HOSPITAL_COMMUNITY)
Admission: EM | Admit: 2024-05-16 | Discharge: 2024-05-22 | DRG: 481 | Disposition: A | Discharge status: Skilled Nursing Facility | Attending: Internal Medicine | Admitting: Internal Medicine

## 2024-05-16 ENCOUNTER — Other Ambulatory Visit: Payer: Self-pay

## 2024-05-16 DIAGNOSIS — F02A2 Dementia in other diseases classified elsewhere, mild, with psychotic disturbance: Secondary | ICD-10-CM | POA: Diagnosis present

## 2024-05-16 DIAGNOSIS — S72141D Displaced intertrochanteric fracture of right femur, subsequent encounter for closed fracture with routine healing: Secondary | ICD-10-CM | POA: Diagnosis not present

## 2024-05-16 DIAGNOSIS — S72001A Fracture of unspecified part of neck of right femur, initial encounter for closed fracture: Secondary | ICD-10-CM | POA: Diagnosis not present

## 2024-05-16 DIAGNOSIS — Z043 Encounter for examination and observation following other accident: Secondary | ICD-10-CM | POA: Diagnosis not present

## 2024-05-16 DIAGNOSIS — Z79890 Hormone replacement therapy (postmenopausal): Secondary | ICD-10-CM

## 2024-05-16 DIAGNOSIS — E039 Hypothyroidism, unspecified: Secondary | ICD-10-CM | POA: Diagnosis present

## 2024-05-16 DIAGNOSIS — S72141A Displaced intertrochanteric fracture of right femur, initial encounter for closed fracture: Principal | ICD-10-CM | POA: Diagnosis present

## 2024-05-16 DIAGNOSIS — M810 Age-related osteoporosis without current pathological fracture: Secondary | ICD-10-CM | POA: Diagnosis present

## 2024-05-16 DIAGNOSIS — Z8041 Family history of malignant neoplasm of ovary: Secondary | ICD-10-CM | POA: Diagnosis not present

## 2024-05-16 DIAGNOSIS — I10 Essential (primary) hypertension: Secondary | ICD-10-CM | POA: Diagnosis present

## 2024-05-16 DIAGNOSIS — Z8249 Family history of ischemic heart disease and other diseases of the circulatory system: Secondary | ICD-10-CM | POA: Diagnosis not present

## 2024-05-16 DIAGNOSIS — Z803 Family history of malignant neoplasm of breast: Secondary | ICD-10-CM

## 2024-05-16 DIAGNOSIS — Z9889 Other specified postprocedural states: Secondary | ICD-10-CM | POA: Diagnosis not present

## 2024-05-16 DIAGNOSIS — F02A11 Dementia in other diseases classified elsewhere, mild, with agitation: Secondary | ICD-10-CM | POA: Diagnosis not present

## 2024-05-16 DIAGNOSIS — E785 Hyperlipidemia, unspecified: Secondary | ICD-10-CM | POA: Diagnosis not present

## 2024-05-16 DIAGNOSIS — F03A11 Unspecified dementia, mild, with agitation: Secondary | ICD-10-CM | POA: Diagnosis present

## 2024-05-16 DIAGNOSIS — W010XXA Fall on same level from slipping, tripping and stumbling without subsequent striking against object, initial encounter: Secondary | ICD-10-CM | POA: Diagnosis not present

## 2024-05-16 DIAGNOSIS — M4312 Spondylolisthesis, cervical region: Secondary | ICD-10-CM | POA: Diagnosis not present

## 2024-05-16 DIAGNOSIS — D72829 Elevated white blood cell count, unspecified: Secondary | ICD-10-CM | POA: Diagnosis present

## 2024-05-16 DIAGNOSIS — R338 Other retention of urine: Secondary | ICD-10-CM | POA: Diagnosis not present

## 2024-05-16 DIAGNOSIS — I452 Bifascicular block: Secondary | ICD-10-CM | POA: Diagnosis not present

## 2024-05-16 DIAGNOSIS — F05 Delirium due to known physiological condition: Secondary | ICD-10-CM | POA: Diagnosis present

## 2024-05-16 DIAGNOSIS — N183 Chronic kidney disease, stage 3 unspecified: Secondary | ICD-10-CM | POA: Diagnosis not present

## 2024-05-16 DIAGNOSIS — M1711 Unilateral primary osteoarthritis, right knee: Secondary | ICD-10-CM | POA: Diagnosis not present

## 2024-05-16 DIAGNOSIS — M25551 Pain in right hip: Secondary | ICD-10-CM | POA: Diagnosis not present

## 2024-05-16 DIAGNOSIS — T797XXA Traumatic subcutaneous emphysema, initial encounter: Secondary | ICD-10-CM | POA: Diagnosis not present

## 2024-05-16 DIAGNOSIS — D62 Acute posthemorrhagic anemia: Secondary | ICD-10-CM | POA: Diagnosis not present

## 2024-05-16 DIAGNOSIS — M5030 Other cervical disc degeneration, unspecified cervical region: Secondary | ICD-10-CM | POA: Diagnosis not present

## 2024-05-16 DIAGNOSIS — I129 Hypertensive chronic kidney disease with stage 1 through stage 4 chronic kidney disease, or unspecified chronic kidney disease: Secondary | ICD-10-CM | POA: Diagnosis not present

## 2024-05-16 DIAGNOSIS — Y92008 Other place in unspecified non-institutional (private) residence as the place of occurrence of the external cause: Secondary | ICD-10-CM | POA: Diagnosis not present

## 2024-05-16 DIAGNOSIS — Z79899 Other long term (current) drug therapy: Secondary | ICD-10-CM

## 2024-05-16 DIAGNOSIS — M79604 Pain in right leg: Secondary | ICD-10-CM | POA: Diagnosis not present

## 2024-05-16 DIAGNOSIS — I700 Atherosclerosis of aorta: Secondary | ICD-10-CM | POA: Diagnosis not present

## 2024-05-16 DIAGNOSIS — M4802 Spinal stenosis, cervical region: Secondary | ICD-10-CM | POA: Diagnosis not present

## 2024-05-16 DIAGNOSIS — S72001D Fracture of unspecified part of neck of right femur, subsequent encounter for closed fracture with routine healing: Secondary | ICD-10-CM | POA: Diagnosis not present

## 2024-05-16 DIAGNOSIS — S199XXA Unspecified injury of neck, initial encounter: Secondary | ICD-10-CM | POA: Diagnosis not present

## 2024-05-16 DIAGNOSIS — S0990XA Unspecified injury of head, initial encounter: Secondary | ICD-10-CM | POA: Diagnosis not present

## 2024-05-16 DIAGNOSIS — M25572 Pain in left ankle and joints of left foot: Secondary | ICD-10-CM | POA: Diagnosis not present

## 2024-05-16 DIAGNOSIS — W19XXXA Unspecified fall, initial encounter: Principal | ICD-10-CM

## 2024-05-16 DIAGNOSIS — Z66 Do not resuscitate: Secondary | ICD-10-CM | POA: Diagnosis present

## 2024-05-16 DIAGNOSIS — I3481 Nonrheumatic mitral (valve) annulus calcification: Secondary | ICD-10-CM | POA: Diagnosis not present

## 2024-05-16 DIAGNOSIS — J42 Unspecified chronic bronchitis: Secondary | ICD-10-CM | POA: Diagnosis not present

## 2024-05-16 LAB — TYPE AND SCREEN
ABO/RH(D): O POS
Antibody Screen: NEGATIVE

## 2024-05-16 LAB — COMPREHENSIVE METABOLIC PANEL WITH GFR
ALT: 18 U/L (ref 0–44)
AST: 21 U/L (ref 15–41)
Albumin: 3.5 g/dL (ref 3.5–5.0)
Alkaline Phosphatase: 75 U/L (ref 38–126)
Anion gap: 10 (ref 5–15)
BUN: 27 mg/dL — ABNORMAL HIGH (ref 8–23)
CO2: 26 mmol/L (ref 22–32)
Calcium: 9.3 mg/dL (ref 8.9–10.3)
Chloride: 106 mmol/L (ref 98–111)
Creatinine, Ser: 0.83 mg/dL (ref 0.44–1.00)
GFR, Estimated: >60 mL/min (ref >60)
Glucose, Bld: 117 mg/dL — ABNORMAL HIGH (ref 70–99)
Potassium: 4.2 mmol/L (ref 3.5–5.1)
Sodium: 142 mmol/L (ref 135–145)
Total Bilirubin: 0.8 mg/dL (ref 0.0–1.2)
Total Protein: 6.6 g/dL (ref 6.5–8.1)

## 2024-05-16 LAB — CBC WITH DIFFERENTIAL/PLATELET
Abs Immature Granulocytes: 0.05 K/uL (ref 0.00–0.07)
Basophils Absolute: 0.1 K/uL (ref 0.0–0.1)
Basophils Relative: 1 %
Eosinophils Absolute: 0.1 K/uL (ref 0.0–0.5)
Eosinophils Relative: 1 %
HCT: 41.6 % (ref 36.0–46.0)
Hemoglobin: 14 g/dL (ref 12.0–15.0)
Immature Granulocytes: 0 %
Lymphocytes Relative: 5 %
Lymphs Abs: 0.7 K/uL (ref 0.7–4.0)
MCH: 32.2 pg (ref 26.0–34.0)
MCHC: 33.7 g/dL (ref 30.0–36.0)
MCV: 95.6 fL (ref 80.0–100.0)
Monocytes Absolute: 0.8 K/uL (ref 0.1–1.0)
Monocytes Relative: 6 %
Neutro Abs: 11.6 K/uL — ABNORMAL HIGH (ref 1.7–7.7)
Neutrophils Relative %: 87 %
Platelets: 159 K/uL (ref 150–400)
RBC: 4.35 MIL/uL (ref 3.87–5.11)
RDW: 13.2 % (ref 11.5–15.5)
WBC: 13.2 K/uL — ABNORMAL HIGH (ref 4.0–10.5)
nRBC: 0 % (ref 0.0–0.2)

## 2024-05-16 LAB — PROTIME-INR
INR: 0.9 (ref 0.8–1.2)
Prothrombin Time: 12.8 s (ref 11.4–15.2)

## 2024-05-16 LAB — CK: Total CK: 71 U/L (ref 38–234)

## 2024-05-16 MED ORDER — LACTATED RINGERS IV SOLN: INTRAVENOUS | Status: AC

## 2024-05-16 MED ORDER — BISACODYL 5 MG PO TBEC: 5.0000 mg | DELAYED_RELEASE_TABLET | Freq: Every day | ORAL | Status: DC | PRN

## 2024-05-16 MED ORDER — QUETIAPINE FUMARATE 25 MG PO TABS
25.0000 mg | ORAL_TABLET | Freq: Every day | ORAL | Status: DC
  Administered 2024-05-16 – 2024-05-19 (×3): 25 mg via ORAL
  Filled 2024-05-16 (×4): qty 1

## 2024-05-16 MED ORDER — HYDROCODONE-ACETAMINOPHEN 5-325 MG PO TABS
1.0000 | ORAL_TABLET | Freq: Four times a day (QID) | ORAL | Status: DC | PRN
  Administered 2024-05-17: 1 via ORAL
  Administered 2024-05-18: 2 via ORAL
  Filled 2024-05-16 (×2): qty 2
  Filled 2024-05-16: qty 1

## 2024-05-16 MED ORDER — OLANZAPINE 10 MG IM SOLR: 2.5000 mg | Freq: Once | INTRAMUSCULAR | Status: DC | PRN

## 2024-05-16 MED ORDER — ONDANSETRON HCL 4 MG/2ML IJ SOLN
4.0000 mg | Freq: Four times a day (QID) | INTRAMUSCULAR | Status: DC | PRN
  Administered 2024-05-16: 4 mg via INTRAVENOUS
  Filled 2024-05-16: qty 2

## 2024-05-16 MED ORDER — HALOPERIDOL LACTATE 5 MG/ML IJ SOLN
1.0000 mg | Freq: Once | INTRAMUSCULAR | Status: DC
  Filled 2024-05-16: qty 1

## 2024-05-16 MED ORDER — LISINOPRIL 20 MG PO TABS
20.0000 mg | ORAL_TABLET | Freq: Every day | ORAL | Status: DC
  Administered 2024-05-18 – 2024-05-21 (×3): 20 mg via ORAL
  Filled 2024-05-16 (×5): qty 1

## 2024-05-16 MED ORDER — SENNOSIDES-DOCUSATE SODIUM 8.6-50 MG PO TABS
1.0000 | ORAL_TABLET | Freq: Every evening | ORAL | Status: DC | PRN
  Administered 2024-05-20: 1 via ORAL
  Filled 2024-05-16: qty 1

## 2024-05-16 MED ORDER — MORPHINE SULFATE (PF) 2 MG/ML IV SOLN
0.5000 mg | INTRAVENOUS | Status: DC | PRN
  Administered 2024-05-16 – 2024-05-17 (×2): 0.5 mg via INTRAVENOUS
  Filled 2024-05-16 (×3): qty 1

## 2024-05-16 MED ORDER — LEVOTHYROXINE SODIUM 25 MCG PO TABS
125.0000 ug | ORAL_TABLET | Freq: Every day | ORAL | Status: DC
  Administered 2024-05-17 – 2024-05-22 (×6): 125 ug via ORAL
  Filled 2024-05-16 (×7): qty 1

## 2024-05-16 MED ORDER — FENTANYL CITRATE PF 50 MCG/ML IJ SOSY
50.0000 ug | PREFILLED_SYRINGE | INTRAMUSCULAR | Status: AC | PRN
  Administered 2024-05-16 (×2): 50 ug via INTRAVENOUS
  Filled 2024-05-16 (×2): qty 1

## 2024-05-16 MED ORDER — LABETALOL HCL 5 MG/ML IV SOLN: 5.0000 mg | INTRAVENOUS | Status: DC | PRN

## 2024-05-16 MED ORDER — HYDROMORPHONE HCL 1 MG/ML IJ SOLN
0.5000 mg | Freq: Once | INTRAMUSCULAR | Status: AC
  Administered 2024-05-16: 0.5 mg via INTRAVENOUS
  Filled 2024-05-16: qty 1

## 2024-05-16 MED ORDER — ACETAMINOPHEN 325 MG PO TABS: 650.0000 mg | ORAL_TABLET | Freq: Four times a day (QID) | ORAL | Status: DC | PRN

## 2024-05-16 NOTE — ED Notes (Signed)
 This RN notified 2W that patient will be sent upstairs with transport.    Transport called and states they can transport patient.

## 2024-05-16 NOTE — ED Provider Notes (Signed)
 [place] EMERGENCY DEPARTMENT AT [facility] Provider Note   CSN: [id number] Arrival date & time: 05/16/24  1354     Patient presents with: [name] [name]   [name] [name] is a 89 y.o. female.   HPI Patient presents for right leg pain.  Medical history includes HTN, HLD, osteoporosis, arthritis.  She has memory issues at baseline.  Currently, she is living on her own.  She was found on floor by family today.  Patient states that she fell last night.  It is believed that she tripped on a cord that was on the ground.  She has since had pain in her proximal right leg.  She denies any other areas of discomfort.  History per daughter: Patient did not fall last night.  She was actually with family last night.  Fall occurred shortly prior to arrival.  She was on the phone with her other daughter and likely tripped on the phone cord as she was walking.  At baseline, she ambulates with a walker.    Prior to Admission medications   Medication Sig Start Date End Date Taking? Authorizing Provider  calcium carbonate (OSCAL) 1500 (600 Ca) MG TABS tablet Take 1 tablet by mouth daily.    [provider]  levothyroxine  (SYNTHROID ) 125 MCG tablet TAKE ONE TABLET ( TOTAL) BY MOUTH DAILY 09/19/23   Antonio Meth, Yvonne R, DO  lisinopril  (ZESTRIL ) 20 MG tablet TAKE ONE TABLET BY MOUTH ONCE DAILY 02/19/24   Antonio Meth, Yvonne R, DO  Multiple Vitamins-Minerals (ICAPS AREDS 2 PO) Take 1 tablet by mouth in the morning and at bedtime.    [provider]  QUEtiapine  (SEROQUEL ) 25 MG tablet Take 1 tablet (25 mg total) by mouth at bedtime. 12/09/23   Lowne Chase, Yvonne R, DO    Allergies: Patient has no known allergies.    Review of Systems  Unable to perform ROS: Dementia  Musculoskeletal:  Positive for arthralgias.  All other systems reviewed and are negative.   Updated Vital Signs BP (!) 174/84   Pulse 87   Temp 98.4 F (36.9 C) (Oral)   Resp (!) 21   Ht 5' 3 (1.6 m)   Wt  49.9 kg   SpO2 99%   BMI 19.49 kg/m   Physical Exam Vitals and nursing note reviewed.  Constitutional:      General: She is not in acute distress.    Appearance: Normal appearance. She is well-developed. She is not ill-appearing, toxic-appearing or diaphoretic.  HENT:     Head: Normocephalic and atraumatic.     Right Ear: External ear normal.     Left Ear: External ear normal.     Nose: Nose normal.     Mouth/Throat:     Mouth: Mucous membranes are moist.  Eyes:     Extraocular Movements: Extraocular movements intact.     Conjunctiva/sclera: Conjunctivae normal.  Cardiovascular:     Rate and Rhythm: Normal rate and regular rhythm.  Pulmonary:     Effort: Pulmonary effort is normal. No respiratory distress.  Chest:     Chest wall: No tenderness.  Abdominal:     General: There is no distension.     Palpations: Abdomen is soft.     Tenderness: There is no abdominal tenderness.  Musculoskeletal:        General: Tenderness, deformity and signs of injury present.     Cervical back: Normal range of motion and neck supple.  Skin:    General: Skin is  warm and dry.     Coloration: Skin is not jaundiced or pale.  Neurological:     General: No focal deficit present.     Mental Status: She is alert. She is disoriented.  Psychiatric:        Mood and Affect: Mood normal.        Behavior: Behavior normal.     (all labs ordered are listed, but only abnormal results are displayed) Labs Reviewed  CBC WITH DIFFERENTIAL/PLATELET - Abnormal; Notable for the following components:      Result Value   WBC 13.2 (*)    Neutro Abs 11.6 (*)    All other components within normal limits  COMPREHENSIVE METABOLIC PANEL WITH GFR - Abnormal; Notable for the following components:   Glucose, Bld 117 (*)    BUN 27 (*)    All other components within normal limits  PROTIME-INR  CK  URINALYSIS, ROUTINE W REFLEX MICROSCOPIC  TYPE AND SCREEN    EKG: None  Radiology: CT CERVICAL SPINE WO  CONTRAST Result Date: 05/16/2024 CLINICAL DATA:  Neck trauma (Age >= 65y).  Fall. EXAM: CT CERVICAL SPINE WITHOUT CONTRAST TECHNIQUE: Multidetector CT imaging of the cervical spine was performed without intravenous contrast. Multiplanar CT image reconstructions were also generated. RADIATION DOSE REDUCTION: This exam was performed according to the departmental dose-optimization program which includes automated exposure control, adjustment of the mA and/or kV according to patient size and/or use of iterative reconstruction technique. COMPARISON:  08/27/2023 FINDINGS: Alignment: 2 mm anterolisthesis of C2 on C3 related to degenerative facet disease. Skull base and vertebrae: No acute fracture. No primary bone lesion or focal pathologic process. Soft tissues and spinal canal: No prevertebral fluid or swelling. No visible canal hematoma. Disc levels: Disc space narrowing and spurring most notable from C3-4 through C6-7. Moderate bilateral degenerative facet disease. Left greater than right. Multilevel bilateral neural foraminal narrowing, left greater than right. Upper chest: No acute findings Other: None IMPRESSION: Degenerative disc and facet disease. No acute bony abnormality. Electronically Signed   By: Franky Crease M.D.   On: 05/16/2024 17:23   CT HEAD WO CONTRAST Result Date: 05/16/2024 CLINICAL DATA:  Head trauma, minor (Age >= 65y).  Fall EXAM: CT HEAD WITHOUT CONTRAST TECHNIQUE: Contiguous axial images were obtained from the base of the skull through the vertex without intravenous contrast. RADIATION DOSE REDUCTION: This exam was performed according to the departmental dose-optimization program which includes automated exposure control, adjustment of the mA and/or kV according to patient size and/or use of iterative reconstruction technique. COMPARISON:  04/27/2024 FINDINGS: Brain: No acute intracranial abnormality. Specifically, no hemorrhage, hydrocephalus, mass lesion, acute infarction, or significant  intracranial injury. There is atrophy and chronic small vessel disease changes. Vascular: No hyperdense vessel or unexpected calcification. Skull: No acute calvarial abnormality. Sinuses/Orbits: No acute findings Other: None IMPRESSION: Atrophy, chronic microvascular disease. No acute intracranial abnormality. Electronically Signed   By: Franky Crease M.D.   On: 05/16/2024 17:22   DG Hip Unilat With Pelvis 2-3 Views Right Result Date: 05/16/2024 CLINICAL DATA:  Unwitnessed fall EXAM: DG HIP (WITH OR WITHOUT PELVIS) 2-3V RIGHT COMPARISON:  04/27/2024 FINDINGS: SI joints are non widened. Pubic symphysis and rami appear intact. Moderate left hip degenerative change. Acute comminuted and displaced right intertrochanteric fracture with varus angulation of distal fracture fragment. No femoral head dislocation IMPRESSION: Acute comminuted and displaced right intertrochanteric fracture. Electronically Signed   By: Luke Bun M.D.   On: 05/16/2024 15:33   DG Chest [facility]  1 View Result Date: 05/16/2024 CLINICAL DATA:  Unwitnessed fall EXAM: PORTABLE CHEST 1 VIEW COMPARISON:  04/27/2024 FINDINGS: Mild chronic bronchitic changes. No acute airspace disease or effusion. Mitral annular calcification. Stable cardiomediastinal silhouette with aortic atherosclerosis. No pneumothorax IMPRESSION: No active disease. Mild chronic bronchitic changes. Electronically Signed   By: Luke Bun M.D.   On: 05/16/2024 15:31     Procedures   Medications Ordered in the ED  ondansetron  (ZOFRAN ) injection 4 mg (4 mg Intravenous Given 05/16/24 1435)  fentaNYL  (SUBLIMAZE ) injection 50 mcg (50 mcg Intravenous Given 05/16/24 1530)  HYDROmorphone  (DILAUDID ) injection 0.5 mg (0.5 mg Intravenous Given 05/16/24 1642)    Clinical Course as of 05/16/24 1751  Sat May 16, 2024  1535 DG Hip Unilat With Pelvis 2-3 Views Right [EL]  1535 DG Chest Port 1 View [EL]    Clinical Course User Index [EL] Charmayne Holmes, DO                                  Medical Decision Making Amount and/or Complexity of Data Reviewed Labs: ordered. Radiology: ordered.  Risk Prescription drug management.   This patient presents to the ED for concern of fall, this involves an extensive number of treatment options, and is a complaint that carries with it a high risk of complications and morbidity.  The differential diagnosis includes acute injuries   Co morbidities / Chronic conditions that complicate the patient evaluation  HTN, HLD, osteoporosis, arthritis   Additional history obtained:  Additional history obtained from EMR External records from outside source obtained and reviewed including EMS, patient's daughter   Lab Tests:  I Ordered, and personally interpreted labs.  The pertinent results include: Leukocytosis is present.  Lab work is otherwise unremarkable   Imaging Studies ordered:  I ordered imaging studies including x-ray of chest, right hip; CT scan of head and cervical spine I independently visualized and interpreted imaging which showed comminuted and displaced right intertrochanteric fracture identified on x-ray.  No other acute findings. I agree with the radiologist interpretation   Cardiac Monitoring: / EKG:  The patient was maintained on a cardiac monitor.  I personally viewed and interpreted the cardiac monitored which showed an underlying rhythm of: Sinus rhythm   Problem List / ED Course / Critical interventions / Medication management  Patient presents after a fall.  Fall suspected to be mechanical after tripping on a cord that was on the ground in her home.  Despite her memory issues, suspected from dementia, she does live alone at this time.  She reports that she fell sometime last night.  There may have been a prolonged downtime.  She endorses pain in her proximal right hip only.  On exam, she does have outward rotation and shortening of her right lower extremity.  I suspect right hip fracture.  As needed  fentanyl  was ordered for analgesia.  Workup was initiated.  Per chart review, she is not prescribed a blood thinner.  Pelvic x-ray confirms right hip fracture.  I spoke with orthopedic surgeon on-call, Dr. Eldridge, who will see in consult.  He recommends n.p.o. at midnight.  Patient's remaining imaging did not show any acute findings.  She was admitted for further management. I ordered medication including fentanyl  and Dilaudid  for analgesia Reevaluation of the patient after these medicines showed that the patient improved I have reviewed the patients home medicines and have made adjustments as needed  Consultations Obtained:  I requested consultation with the orthopedic surgeon, Dr. Eldridge,  and discussed lab and imaging findings as well as pertinent plan - they recommend: Medicine admission, n.p.o. at midnight for possible surgery tomorrow   Social Determinants of Health:  Memory impairment at baseline, lives at home alone with close family support.     Final diagnoses:  Fall, initial encounter  Closed fracture of right hip, initial encounter [facility])    ED Discharge Orders     None          Melvenia Motto, MD 05/16/24 1752

## 2024-05-16 NOTE — H&P (Signed)
 History and Physical    [name] [name] FMW:[id number] DOB: Jan 06, 1927 DOA: 05/16/2024  PCP: Antonio Cyndee Jamee JONELLE, DO  Patient coming from: Home  I have personally briefly reviewed patient's old medical records in [facility]  Chief Complaint: Fall at home, right lower extremity pain  HPI: [name] [name] is a 89 y.o. female with medical history significant for HTN, hypothyroidism, dementia who presented to the ED for evaluation after a fall at home.  Patient unable provide history due to dementia with delirium and is otherwise supplemented by EDP, chart review, and family at bedside.  Patient lives by herself but has caregivers and family frequently in the house to help her.  Patient was found on the floor today by family with a telephone cord wrapped around her leg.  It is believed that she tripped over the cord leading to her fall.  The fall occurred shortly prior family arrived to the house as she was on the phone talking with her other daughter when it happened.  Patient ambulates with use of a walker at baseline.  ED Course  Labs/Imaging on admission: I have personally reviewed following labs and imaging studies.  Initial vitals showed BP 167/92, pulse 87, RR 17, temp 97.9 F, SpO2 100% on room air.  Labs showed WBC 13.2, hemoglobin 14.0, platelets 159, sodium 142, potassium 4.2, bicarb 26, BUN 27, creatinine 0.83, serum glucose 117, LFTs within normal limits, INR 0.9, CK 71.  Right pelvic x-ray showed acute comminuted and displaced right intertrochanteric fracture.  Portable chest x-ray negative for active disease.  Mild chronic bronchitic changes noted.  CT head without contrast negative for acute intracranial normality.  Atrophy, chronic microvascular disease noted.  CT cervical spine without contrast negative for acute bony abnormality.  Degenerative disc and facet disease noted.  Patient was given IV fentanyl , Dilaudid , 1 mg IV Haldol .  Orthopedics were consulted and  patient was evaluated by Dr. Edna who recommended medical admission and plan for surgical fixation tomorrow morning.  The hospitalist service was consulted to admit.  Review of Systems: All systems reviewed and are negative except as documented in history of present illness above.   Past Medical History:  Diagnosis Date   Arthritis    right thumb (09/05/2015)   Hyperlipidemia    Hypertension    Hypothyroidism    Osteoporosis     Past Surgical History:  Procedure Laterality Date   CATARACT EXTRACTION W/ INTRAOCULAR LENS  IMPLANT, BILATERAL Bilateral 2010   CLOSED REDUCTION SHOULDER DISLOCATION Right    OVARIAN CYST SURGERY  1949   TONSILLECTOMY      Social History: Social History   Tobacco Use   Smoking status: Never   Smokeless tobacco: Never  Vaping Use   Vaping status: Never Used  Substance Use Topics   Alcohol use: No   Drug use: No   No Known Allergies  Family History  Problem Relation Age of Onset   Cancer Mother        bladder   Cancer Father        unknown   Ovarian cancer Sister    Heart disease Brother        All 5 brothers have had heart issues starting in their 46s   Breast cancer Daughter      Prior to Admission medications   Medication Sig Start Date End Date Taking? Authorizing Provider  calcium carbonate (OSCAL) 1500 (600 Ca) MG TABS tablet Take 1 tablet by mouth daily.  [provider]  levothyroxine  (SYNTHROID ) 125 MCG tablet TAKE ONE TABLET ( TOTAL) BY MOUTH DAILY 09/19/23   Antonio Meth, Yvonne R, DO  lisinopril  (ZESTRIL ) 20 MG tablet TAKE ONE TABLET BY MOUTH ONCE DAILY 02/19/24   Antonio Meth, Yvonne R, DO  Multiple Vitamins-Minerals (ICAPS AREDS 2 PO) Take 1 tablet by mouth in the morning and at bedtime.    [provider]  QUEtiapine  (SEROQUEL ) 25 MG tablet Take 1 tablet (25 mg total) by mouth at bedtime. 12/09/23   Antonio Meth Jamee JONELLE, DO    Physical Exam: Vitals:   05/16/24 1730 05/16/24 1800 05/16/24  1900 05/16/24 1930  BP: (!) 151/65 (!) 172/113    Pulse: 74 89 98 97  Resp: 14 15 15 13   Temp:      TempSrc:      SpO2: 98% 99% 99% 97%  Weight:      Height:       Exam limited due to cooperation/agitation Constitutional: Thin elderly woman resting in bed with both hips rotated to the right Eyes: EOMI, lids and conjunctivae normal ENMT: Mucous membranes are moist. Posterior pharynx clear of any exudate or lesions.Normal dentition.  Neck: normal, supple, no masses. Respiratory: clear to auscultation anteriorly. Normal respiratory effort. No accessory muscle use.  Cardiovascular: Regular rate and rhythm, no murmurs / rubs / gallops. No extremity edema. 2+ pedal pulses. Abdomen: no tenderness, no masses palpated. Musculoskeletal: ROM both upper extremities intact.  Limited lower extremities due to right hip fracture.  RLE is shortened compared to left Skin: no rashes, lesions, ulcers. No induration Neurologic: Sensation intact. Strength intact both upper extremities, limited lower extremities due to right hip fracture Psychiatric: Awake and alert but delirious.  Not oriented to self, place, situation, or year.  Agitated.  EKG: Personally reviewed. Sinus rhythm, rate 78, RBBB and LAFB.  Rate is faster when compared to previous.  Assessment/Plan Principal Problem:   Closed displaced intertrochanteric fracture of right femur, initial encounter (HCC) Active Problems:   Hypothyroidism   Essential hypertension   Mild dementia with agitation (HCC)   [name] [name] is a 89 y.o. female with medical history significant for HTN, hypothyroidism, dementia who is admitted with an acute right hip fracture.  Assessment and Plan: Acute displaced subtrochanteric right femur fracture: - Orthopedics following, plan for surgical fixation in OR 7/6 AM - Keep n.p.o. after midnight - Continue analgesics as needed  Leukocytosis: Likely reactive in setting of acute fracture.  Continue to  monitor.  Hypertension: Continue lisinopril .  Hypothyroidism: Continue Synthroid .  Dementia with delirium/agitation: Continue Seroquel .  Delirium and fall precautions.  Daughter planning on staying with patient tonight.   DVT prophylaxis: SCDs Start: 05/16/24 1952 Code Status:   Code Status: Limited: Do not attempt resuscitation (DNR) -DNR-LIMITED -Do Not Intubate/DNI   discussed and confirmed with patient's daughter on admission. Family Communication: Daughter and son-in-law at bedside Disposition Plan: From home, dispo pending clinical progress Consults called: Orthopedics Severity of Illness: The appropriate patient status for this patient is INPATIENT. Inpatient status is judged to be reasonable and necessary in order to provide the required intensity of service to ensure the patient's safety. The patient's presenting symptoms, physical exam findings, and initial radiographic and laboratory data in the context of their chronic comorbidities is felt to place them at high risk for further clinical deterioration. Furthermore, it is not anticipated that the patient will be medically stable for discharge from the hospital within 2 midnights of admission.   *  I certify that at the point of admission it is my clinical judgment that the patient will require inpatient hospital care spanning beyond 2 midnights from the point of admission due to high intensity of service, high risk for further deterioration and high frequency of surveillance required.DEWAINE Jorie Blanch MD [facility]  If 7PM-7AM, please contact night-coverage [email or web address]  05/16/2024, 8:08 PM

## 2024-05-16 NOTE — Hospital Course (Signed)
 NOLIE BIGNELL is a 89 y.o. female with medical history significant for HTN, hypothyroidism, dementia who is admitted with an acute right hip fracture.

## 2024-05-16 NOTE — ED Triage Notes (Signed)
 Pt to er via ems, per ems pt was found down with a cord wrapped around her leg, states that it is an unknown down time, pt states that she fell last night at 1130pm, pt R leg is shorter than the L and rotated, pt c/o R leg pain, pt awake and oriented to person place and day of the week

## 2024-05-16 NOTE — ED Notes (Signed)
 RN introduced self to pt and to son in law.  Pt is confused at this time.  Pt is refusing medications at this time.  Son in law advises that pt daughter is coming and may be able to help pt.

## 2024-05-16 NOTE — Consult Note (Signed)
 ORTHOPAEDIC CONSULTATION  REQUESTING PHYSICIAN: Melvenia Motto, MD  Chief Complaint: Right hip fracture  HPI: [name] [name] is a 89 y.o. female who sustained a fall earlier today.  Unable to ambulate.  Was brought to emergency room found to have a right hip fracture.  At baseline she is ambulatory with a walker.  Denies prior history of hip pain.  Denies pain of joints or extremities.  Denies distal numbness and tingling.  Family is at bedside.  Past Medical History:  Diagnosis Date   Arthritis    right thumb (09/05/2015)   Hyperlipidemia    Hypertension    Hypothyroidism    Osteoporosis    Past Surgical History:  Procedure Laterality Date   CATARACT EXTRACTION W/ INTRAOCULAR LENS  IMPLANT, BILATERAL Bilateral 2010   CLOSED REDUCTION SHOULDER DISLOCATION Right    OVARIAN CYST SURGERY  1949   TONSILLECTOMY     Social History   Socioeconomic History   Marital status: Widowed    Spouse name: Not on file   Number of children: Not on file   Years of education: Not on file   Highest education level: Not on file  Occupational History   Not on file  Tobacco Use   Smoking status: Never   Smokeless tobacco: Never  Vaping Use   Vaping status: Never Used  Substance and Sexual Activity   Alcohol use: No   Drug use: No   Sexual activity: Yes    Partners: Male  Other Topics Concern   Not on file  Social History Narrative   Lives with her husband in a house    2 children close by to help   Social Drivers of Health   Financial Resource Strain: Low Risk  (03/16/2022)   Overall Financial Resource Strain (CARDIA)    Difficulty of Paying Living Expenses: Not hard at all  Food Insecurity: No Food Insecurity (03/16/2022)   Hunger Vital Sign    Worried About Running Out of Food in the Last Year: Never true    Ran Out of Food in the Last Year: Never true  Transportation Needs: No Transportation Needs (12/08/2021)   PRAPARE - Administrator, Civil Service (Medical): No     Lack of Transportation (Non-Medical): No  Physical Activity: Insufficiently Active (03/16/2022)   Exercise Vital Sign    Days of Exercise per Week: 3 days    Minutes of Exercise per Session: 10 min  Stress: Stress Concern Present (03/16/2022)   [employer] of Occupational Health - Occupational Stress Questionnaire    Feeling of Stress : Rather much  Social Connections: Not on file   Family History  Problem Relation Age of Onset   Cancer Mother        bladder   Cancer Father        unknown   Ovarian cancer Sister    Heart disease Brother        All 5 brothers have had heart issues starting in their 45s   Breast cancer Daughter    No Known Allergies   Positive ROS: All other systems have been reviewed and were otherwise negative with the exception of those mentioned in the HPI and as above.  Physical Exam: General: Alert, no acute distress Cardiovascular: No pedal edema Respiratory: No cyanosis, no use of accessory musculature Skin: No lesions in the area of chief complaint Neurologic: Sensation intact distally Psychiatric: Patient is competent for consent with normal mood and affect  MUSCULOSKELETAL:  LLE No  traumatic wounds, ecchymosis, or rash  Nontender  No groin pain with log roll  No knee or ankle effusion  Sens DPN, SPN, TN intact  Motor EHL, ext, flex 5/5  DP 2+, PT 2+, No significant edema LLE No traumatic wounds, ecchymosis, or rash  Nontender  Pain in the right hip and thigh with any manipulation  No knee or ankle effusion  Sens DPN, SPN, TN intact  Motor EHL, ext, flex 5/5  DP 2+, PT 2+, No significant edema   IMAGING: X-rays pelvis right hip reviewed demonstrate right subtrochanteric femur fracture with reverse obliquity  Assessment: Active Problems:   * No active hospital problems. *   Right subtrochanteric femur fracture displaced  Plan: Discussed with patient and family unfortunately has a right subtrochanteric femur fracture.  Would  recommend surgical treatment for pain control and mobility.  Will plan for surgery tomorrow morning.  Patient okay to eat today.  To be n.p.o. after midnight. The risks benefits and alternatives were discussed with the patient including but not limited to the risks of nonoperative treatment, versus surgical intervention including infection, bleeding, nerve injury, malunion, nonunion, the need for revision surgery, hardware prominence, hardware failure, the need for hardware removal, blood clots, cardiopulmonary complications, morbidity, mortality, among others, and they were willing to proceed.        TORIBIO DELENA HIGASHI, MD  Contact information:   Tzzxijbd 7am-5pm epic message Dr. HIGASHI, or call office for patient follow up: [phone] After hours and holidays please check [email or web address] for group call information for [facility]

## 2024-05-16 NOTE — H&P (View-Only) (Signed)
 ORTHOPAEDIC CONSULTATION  REQUESTING PHYSICIAN: Melvenia Motto, MD  Chief Complaint: Right hip fracture  HPI: [name] [name] is a 89 y.o. female who sustained a fall earlier today.  Unable to ambulate.  Was brought to emergency room found to have a right hip fracture.  At baseline she is ambulatory with a walker.  Denies prior history of hip pain.  Denies pain of joints or extremities.  Denies distal numbness and tingling.  Family is at bedside.  Past Medical History:  Diagnosis Date   Arthritis    right thumb (09/05/2015)   Hyperlipidemia    Hypertension    Hypothyroidism    Osteoporosis    Past Surgical History:  Procedure Laterality Date   CATARACT EXTRACTION W/ INTRAOCULAR LENS  IMPLANT, BILATERAL Bilateral 2010   CLOSED REDUCTION SHOULDER DISLOCATION Right    OVARIAN CYST SURGERY  1949   TONSILLECTOMY     Social History   Socioeconomic History   Marital status: Widowed    Spouse name: Not on file   Number of children: Not on file   Years of education: Not on file   Highest education level: Not on file  Occupational History   Not on file  Tobacco Use   Smoking status: Never   Smokeless tobacco: Never  Vaping Use   Vaping status: Never Used  Substance and Sexual Activity   Alcohol use: No   Drug use: No   Sexual activity: Yes    Partners: Male  Other Topics Concern   Not on file  Social History Narrative   Lives with her husband in a house    2 children close by to help   Social Drivers of Health   Financial Resource Strain: Low Risk  (03/16/2022)   Overall Financial Resource Strain (CARDIA)    Difficulty of Paying Living Expenses: Not hard at all  Food Insecurity: No Food Insecurity (03/16/2022)   Hunger Vital Sign    Worried About Running Out of Food in the Last Year: Never true    Ran Out of Food in the Last Year: Never true  Transportation Needs: No Transportation Needs (12/08/2021)   PRAPARE - Administrator, Civil Service (Medical): No     Lack of Transportation (Non-Medical): No  Physical Activity: Insufficiently Active (03/16/2022)   Exercise Vital Sign    Days of Exercise per Week: 3 days    Minutes of Exercise per Session: 10 min  Stress: Stress Concern Present (03/16/2022)   [employer] of Occupational Health - Occupational Stress Questionnaire    Feeling of Stress : Rather much  Social Connections: Not on file   Family History  Problem Relation Age of Onset   Cancer Mother        bladder   Cancer Father        unknown   Ovarian cancer Sister    Heart disease Brother        All 5 brothers have had heart issues starting in their 45s   Breast cancer Daughter    No Known Allergies   Positive ROS: All other systems have been reviewed and were otherwise negative with the exception of those mentioned in the HPI and as above.  Physical Exam: General: Alert, no acute distress Cardiovascular: No pedal edema Respiratory: No cyanosis, no use of accessory musculature Skin: No lesions in the area of chief complaint Neurologic: Sensation intact distally Psychiatric: Patient is competent for consent with normal mood and affect  MUSCULOSKELETAL:  LLE No  traumatic wounds, ecchymosis, or rash  Nontender  No groin pain with log roll  No knee or ankle effusion  Sens DPN, SPN, TN intact  Motor EHL, ext, flex 5/5  DP 2+, PT 2+, No significant edema LLE No traumatic wounds, ecchymosis, or rash  Nontender  Pain in the right hip and thigh with any manipulation  No knee or ankle effusion  Sens DPN, SPN, TN intact  Motor EHL, ext, flex 5/5  DP 2+, PT 2+, No significant edema   IMAGING: X-rays pelvis right hip reviewed demonstrate right subtrochanteric femur fracture with reverse obliquity  Assessment: Active Problems:   * No active hospital problems. *   Right subtrochanteric femur fracture displaced  Plan: Discussed with patient and family unfortunately has a right subtrochanteric femur fracture.  Would  recommend surgical treatment for pain control and mobility.  Will plan for surgery tomorrow morning.  Patient okay to eat today.  To be n.p.o. after midnight. The risks benefits and alternatives were discussed with the patient including but not limited to the risks of nonoperative treatment, versus surgical intervention including infection, bleeding, nerve injury, malunion, nonunion, the need for revision surgery, hardware prominence, hardware failure, the need for hardware removal, blood clots, cardiopulmonary complications, morbidity, mortality, among others, and they were willing to proceed.        TORIBIO DELENA HIGASHI, MD  Contact information:   Tzzxijbd 7am-5pm epic message Dr. HIGASHI, or call office for patient follow up: [phone] After hours and holidays please check [email or web address] for group call information for [facility]

## 2024-05-16 NOTE — ED Notes (Signed)
 Pt is confused at this time and is unable to remain still to obtain a valid blood pressure.

## 2024-05-17 ENCOUNTER — Inpatient Hospital Stay (HOSPITAL_COMMUNITY): Admitting: Certified Registered Nurse Anesthetist

## 2024-05-17 ENCOUNTER — Encounter (HOSPITAL_COMMUNITY)
Admission: EM | Disposition: A | Discharge status: Skilled Nursing Facility | Payer: Self-pay | Source: Home / Self Care | Attending: Internal Medicine

## 2024-05-17 ENCOUNTER — Encounter (HOSPITAL_COMMUNITY): Payer: Self-pay | Admitting: Internal Medicine

## 2024-05-17 ENCOUNTER — Other Ambulatory Visit: Payer: Self-pay

## 2024-05-17 ENCOUNTER — Inpatient Hospital Stay (HOSPITAL_COMMUNITY)

## 2024-05-17 DIAGNOSIS — S72141A Displaced intertrochanteric fracture of right femur, initial encounter for closed fracture: Secondary | ICD-10-CM

## 2024-05-17 DIAGNOSIS — I129 Hypertensive chronic kidney disease with stage 1 through stage 4 chronic kidney disease, or unspecified chronic kidney disease: Secondary | ICD-10-CM | POA: Diagnosis not present

## 2024-05-17 DIAGNOSIS — E039 Hypothyroidism, unspecified: Secondary | ICD-10-CM

## 2024-05-17 DIAGNOSIS — N183 Chronic kidney disease, stage 3 unspecified: Secondary | ICD-10-CM | POA: Diagnosis not present

## 2024-05-17 HISTORY — PX: INTRAMEDULLARY (IM) NAIL INTERTROCHANTERIC: SHX5875

## 2024-05-17 LAB — ABO/RH: ABO/RH(D): O POS

## 2024-05-17 LAB — BASIC METABOLIC PANEL WITH GFR
Anion gap: 11 (ref 5–15)
BUN: 19 mg/dL (ref 8–23)
CO2: 23 mmol/L (ref 22–32)
Calcium: 8.8 mg/dL — ABNORMAL LOW (ref 8.9–10.3)
Chloride: 101 mmol/L (ref 98–111)
Creatinine, Ser: 0.87 mg/dL (ref 0.44–1.00)
GFR, Estimated: >60 mL/min (ref >60)
Glucose, Bld: 155 mg/dL — ABNORMAL HIGH (ref 70–99)
Potassium: 4.7 mmol/L (ref 3.5–5.1)
Sodium: 135 mmol/L (ref 135–145)

## 2024-05-17 LAB — CBC
HCT: 36.9 % (ref 36.0–46.0)
Hemoglobin: 11.9 g/dL — ABNORMAL LOW (ref 12.0–15.0)
MCH: 30.7 pg (ref 26.0–34.0)
MCHC: 32.2 g/dL (ref 30.0–36.0)
MCV: 95.1 fL (ref 80.0–100.0)
Platelets: 151 K/uL (ref 150–400)
RBC: 3.88 MIL/uL (ref 3.87–5.11)
RDW: 13.2 % (ref 11.5–15.5)
WBC: 13.8 K/uL — ABNORMAL HIGH (ref 4.0–10.5)
nRBC: 0 % (ref 0.0–0.2)

## 2024-05-17 LAB — SURGICAL PCR SCREEN
MRSA, PCR: NEGATIVE
Staphylococcus aureus: NEGATIVE

## 2024-05-17 SURGERY — FIXATION, FRACTURE, INTERTROCHANTERIC, WITH INTRAMEDULLARY ROD
Anesthesia: General | Laterality: Right

## 2024-05-17 MED ORDER — DEXAMETHASONE SODIUM PHOSPHATE 10 MG/ML IJ SOLN
INTRAMUSCULAR | Status: AC
  Filled 2024-05-17: qty 1

## 2024-05-17 MED ORDER — PHENYLEPHRINE 80 MCG/ML (10ML) SYRINGE FOR IV PUSH (FOR BLOOD PRESSURE SUPPORT)
PREFILLED_SYRINGE | INTRAVENOUS | Status: DC | PRN
  Administered 2024-05-17 (×2): 160 ug via INTRAVENOUS

## 2024-05-17 MED ORDER — ONDANSETRON HCL 4 MG/2ML IJ SOLN
INTRAMUSCULAR | Status: DC | PRN
  Administered 2024-05-17: 4 mg via INTRAVENOUS

## 2024-05-17 MED ORDER — CEFAZOLIN SODIUM-DEXTROSE 2-4 GM/100ML-% IV SOLN
2.0000 g | INTRAVENOUS | Status: AC
  Administered 2024-05-17: 2 g via INTRAVENOUS

## 2024-05-17 MED ORDER — LIDOCAINE 2% (20 MG/ML) 5 ML SYRINGE
INTRAMUSCULAR | Status: DC | PRN
  Administered 2024-05-17: 50 mg via INTRAVENOUS

## 2024-05-17 MED ORDER — SUGAMMADEX SODIUM 200 MG/2ML IV SOLN
INTRAVENOUS | Status: DC | PRN
  Administered 2024-05-17: 100 mg via INTRAVENOUS

## 2024-05-17 MED ORDER — PHENYLEPHRINE HCL-NACL 20-0.9 MG/250ML-% IV SOLN
INTRAVENOUS | Status: DC | PRN
  Administered 2024-05-17: 40 ug/min via INTRAVENOUS

## 2024-05-17 MED ORDER — LORAZEPAM 2 MG/ML IJ SOLN
0.5000 mg | Freq: Once | INTRAMUSCULAR | Status: AC | PRN
Start: 2024-05-17 — End: 2024-05-18
  Administered 2024-05-18: 0.5 mg via INTRAVENOUS
  Filled 2024-05-17: qty 1

## 2024-05-17 MED ORDER — FENTANYL CITRATE (PF) 100 MCG/2ML IJ SOLN
INTRAMUSCULAR | Status: AC
  Filled 2024-05-17: qty 2

## 2024-05-17 MED ORDER — CHLORHEXIDINE GLUCONATE 0.12 % MT SOLN
OROMUCOSAL | Status: AC
  Filled 2024-05-17: qty 15

## 2024-05-17 MED ORDER — CEFAZOLIN SODIUM-DEXTROSE 2-4 GM/100ML-% IV SOLN
INTRAVENOUS | Status: AC
Start: 2024-05-17 — End: 2024-05-17
  Filled 2024-05-17: qty 100

## 2024-05-17 MED ORDER — ARTIFICIAL TEARS OPHTHALMIC OINT
TOPICAL_OINTMENT | OPHTHALMIC | Status: AC
  Filled 2024-05-17: qty 3.5

## 2024-05-17 MED ORDER — LACTATED RINGERS IV SOLN: INTRAVENOUS | Status: DC

## 2024-05-17 MED ORDER — DEXAMETHASONE SODIUM PHOSPHATE 10 MG/ML IJ SOLN
INTRAMUSCULAR | Status: DC | PRN
  Administered 2024-05-17: 5 mg via INTRAVENOUS

## 2024-05-17 MED ORDER — ACETAMINOPHEN 10 MG/ML IV SOLN
INTRAVENOUS | Status: AC
  Filled 2024-05-17: qty 100

## 2024-05-17 MED ORDER — 0.9 % SODIUM CHLORIDE (POUR BTL) OPTIME
TOPICAL | Status: DC | PRN
  Administered 2024-05-17: 1000 mL

## 2024-05-17 MED ORDER — CHLORHEXIDINE GLUCONATE 4 % EX SOLN: 60.0000 mL | Freq: Once | CUTANEOUS | Status: DC

## 2024-05-17 MED ORDER — FENTANYL CITRATE (PF) 250 MCG/5ML IJ SOLN
INTRAMUSCULAR | Status: DC | PRN
Start: 2024-05-17 — End: 2024-05-17
  Administered 2024-05-17: 25 ug via INTRAVENOUS
  Administered 2024-05-17: 75 ug via INTRAVENOUS

## 2024-05-17 MED ORDER — ONDANSETRON HCL 4 MG/2ML IJ SOLN: 4.0000 mg | Freq: Four times a day (QID) | INTRAMUSCULAR | Status: DC | PRN

## 2024-05-17 MED ORDER — ONDANSETRON HCL 4 MG/2ML IJ SOLN
INTRAMUSCULAR | Status: AC
  Filled 2024-05-17: qty 2

## 2024-05-17 MED ORDER — CHLORHEXIDINE GLUCONATE 0.12 % MT SOLN
15.0000 mL | Freq: Once | OROMUCOSAL | Status: AC
  Administered 2024-05-17: 15 mL via OROMUCOSAL

## 2024-05-17 MED ORDER — ROCURONIUM BROMIDE 10 MG/ML (PF) SYRINGE
PREFILLED_SYRINGE | INTRAVENOUS | Status: DC | PRN
  Administered 2024-05-17: 40 mg via INTRAVENOUS

## 2024-05-17 MED ORDER — OXYCODONE HCL 5 MG/5ML PO SOLN: 5.0000 mg | Freq: Once | ORAL | Status: DC | PRN

## 2024-05-17 MED ORDER — PROPOFOL 10 MG/ML IV BOLUS
INTRAVENOUS | Status: AC
  Filled 2024-05-17: qty 20

## 2024-05-17 MED ORDER — TRANEXAMIC ACID-NACL 1000-0.7 MG/100ML-% IV SOLN
INTRAVENOUS | Status: AC
  Filled 2024-05-17: qty 100

## 2024-05-17 MED ORDER — ACETAMINOPHEN 10 MG/ML IV SOLN
INTRAVENOUS | Status: DC | PRN
  Administered 2024-05-17: 750 mg via INTRAVENOUS

## 2024-05-17 MED ORDER — SUGAMMADEX SODIUM 200 MG/2ML IV SOLN
INTRAVENOUS | Status: AC
  Filled 2024-05-17: qty 2

## 2024-05-17 MED ORDER — FENTANYL CITRATE (PF) 250 MCG/5ML IJ SOLN
INTRAMUSCULAR | Status: AC
  Filled 2024-05-17: qty 5

## 2024-05-17 MED ORDER — PROPOFOL 10 MG/ML IV BOLUS
INTRAVENOUS | Status: DC | PRN
  Administered 2024-05-17: 70 mg via INTRAVENOUS

## 2024-05-17 MED ORDER — PHENYLEPHRINE 80 MCG/ML (10ML) SYRINGE FOR IV PUSH (FOR BLOOD PRESSURE SUPPORT)
PREFILLED_SYRINGE | INTRAVENOUS | Status: AC
Start: 2024-05-17 — End: 2024-05-17
  Filled 2024-05-17: qty 10

## 2024-05-17 MED ORDER — VANCOMYCIN HCL 1000 MG IV SOLR
INTRAVENOUS | Status: AC
Start: 2024-05-17 — End: 2024-05-17
  Filled 2024-05-17: qty 20

## 2024-05-17 MED ORDER — TRANEXAMIC ACID-NACL 1000-0.7 MG/100ML-% IV SOLN
1000.0000 mg | INTRAVENOUS | Status: AC
  Administered 2024-05-17: 1000 mg via INTRAVENOUS

## 2024-05-17 MED ORDER — CEFAZOLIN SODIUM-DEXTROSE 2-4 GM/100ML-% IV SOLN
2.0000 g | Freq: Three times a day (TID) | INTRAVENOUS | Status: AC
  Administered 2024-05-17 (×2): 2 g via INTRAVENOUS
  Filled 2024-05-17 (×2): qty 100

## 2024-05-17 MED ORDER — STERILE WATER FOR IRRIGATION IR SOLN
Status: DC | PRN
  Administered 2024-05-17: 1000 mL

## 2024-05-17 MED ORDER — HALOPERIDOL LACTATE 5 MG/ML IJ SOLN
2.0000 mg | Freq: Four times a day (QID) | INTRAMUSCULAR | Status: DC | PRN
  Administered 2024-05-17: 2 mg via INTRAVENOUS
  Filled 2024-05-17: qty 1

## 2024-05-17 MED ORDER — OXYCODONE HCL 5 MG PO TABS: 5.0000 mg | ORAL_TABLET | Freq: Once | ORAL | Status: DC | PRN

## 2024-05-17 MED ORDER — FENTANYL CITRATE (PF) 100 MCG/2ML IJ SOLN
25.0000 ug | INTRAMUSCULAR | Status: DC | PRN
  Administered 2024-05-17 (×4): 25 ug via INTRAVENOUS

## 2024-05-17 MED ORDER — LIDOCAINE 2% (20 MG/ML) 5 ML SYRINGE
INTRAMUSCULAR | Status: AC
  Filled 2024-05-17: qty 5

## 2024-05-17 MED ORDER — ORAL CARE MOUTH RINSE: 15.0000 mL | Freq: Once | OROMUCOSAL | Status: AC

## 2024-05-17 MED ORDER — ENOXAPARIN SODIUM 40 MG/0.4ML IJ SOSY
40.0000 mg | PREFILLED_SYRINGE | INTRAMUSCULAR | Status: DC
  Administered 2024-05-18 – 2024-05-20 (×3): 40 mg via SUBCUTANEOUS
  Filled 2024-05-17 (×3): qty 0.4

## 2024-05-17 MED ORDER — ROCURONIUM BROMIDE 10 MG/ML (PF) SYRINGE
PREFILLED_SYRINGE | INTRAVENOUS | Status: AC
  Filled 2024-05-17: qty 10

## 2024-05-17 MED ORDER — POVIDONE-IODINE 10 % EX SWAB
2.0000 | Freq: Once | CUTANEOUS | Status: AC
  Administered 2024-05-17: 2 via TOPICAL

## 2024-05-17 SURGICAL SUPPLY — 37 items
BAG COUNTER SPONGE SURGICOUNT (BAG) ×1 IMPLANT
BIT DRILL INTERTAN LAG SCREW (BIT) IMPLANT
BIT DRILL SHORT 4.0 (BIT) IMPLANT
CHLORAPREP W/TINT 26 (MISCELLANEOUS) ×1 IMPLANT
COVER PERINEAL POST (MISCELLANEOUS) ×1 IMPLANT
COVER SURGICAL LIGHT HANDLE (MISCELLANEOUS) ×1 IMPLANT
DERMABOND ADVANCED .7 DNX12 (GAUZE/BANDAGES/DRESSINGS) IMPLANT
DERMABOND ADVANCED .7 DNX6 (GAUZE/BANDAGES/DRESSINGS) ×1 IMPLANT
DRAPE C-ARM 42X72 X-RAY (DRAPES) ×1 IMPLANT
DRAPE C-ARMOR (DRAPES) ×1 IMPLANT
DRAPE STERI IOBAN 125X83 (DRAPES) ×1 IMPLANT
DRAPE U-SHAPE 47X51 STRL (DRAPES) ×2 IMPLANT
DRESSING MEPILEX FLEX 4X4 (GAUZE/BANDAGES/DRESSINGS) IMPLANT
DRSG TEGADERM 4X4.75 (GAUZE/BANDAGES/DRESSINGS) ×3 IMPLANT
ELECT REM PT RETURN 15FT ADLT (MISCELLANEOUS) IMPLANT
GAUZE SPONGE 4X4 16PLY NS LF (WOUND CARE) ×1 IMPLANT
GLOVE BIO SURGEON STRL SZ 6.5 (GLOVE) ×1 IMPLANT
GLOVE BIOGEL PI IND STRL 6.5 (GLOVE) ×1 IMPLANT
GLOVE BIOGEL PI IND STRL 8 (GLOVE) ×1 IMPLANT
GLOVE SURG ORTHO 8.0 STRL STRW (GLOVE) ×2 IMPLANT
GOWN STRL REUS W/ TWL XL LVL3 (GOWN DISPOSABLE) ×3 IMPLANT
KIT BASIN OR (CUSTOM PROCEDURE TRAY) ×1 IMPLANT
KIT TURNOVER KIT B (KITS) IMPLANT
MANIFOLD NEPTUNE II (INSTRUMENTS) ×1 IMPLANT
NAIL LOCK CANN 10X400 130D RT (Nail) IMPLANT
NS IRRIG 1000ML POUR BTL (IV SOLUTION) ×1 IMPLANT
PACK GENERAL/GYN (CUSTOM PROCEDURE TRAY) ×1 IMPLANT
PAD ARMBOARD POSITIONER FOAM (MISCELLANEOUS) ×1 IMPLANT
PIN GUIDE 3.2X343MM (PIN) IMPLANT
ROD GUIDE 3.0 (MISCELLANEOUS) IMPLANT
SCREW LAG COMPR KIT 95/90 (Screw) IMPLANT
SCREW TRIGEN LOW PROF 5.0X35 (Screw) IMPLANT
SCREW TRIGEN LOW PROF 5.0X40 (Screw) IMPLANT
SUT MNCRL AB 3-0 PS2 18 (SUTURE) ×1 IMPLANT
SUT VIC AB 0 CT1 27XBRD ANBCTR (SUTURE) ×1 IMPLANT
SUT VIC AB 2-0 CT2 27 (SUTURE) ×1 IMPLANT
TOWEL OR NON WOVEN STRL DISP B (DISPOSABLE) ×1 IMPLANT

## 2024-05-17 NOTE — Anesthesia Procedure Notes (Signed)
 Procedure Name: Intubation Date/Time: 05/17/2024 7:49 AM  Performed by: Roddie Grate, CRNAPre-anesthesia Checklist: Patient identified, Emergency Drugs available, Suction available, Patient being monitored and Timeout performed Patient Re-evaluated:Patient Re-evaluated prior to induction Oxygen Delivery Method: Circle system utilized Preoxygenation: Pre-oxygenation with 100% oxygen Induction Type: IV induction Ventilation: Mask ventilation without difficulty Laryngoscope Size: Mac and 3 Grade View: Grade I Tube type: Oral Tube size: 6.5 mm Number of attempts: 1 Airway Equipment and Method: Stylet Placement Confirmation: ETT inserted through vocal cords under direct vision, positive ETCO2 and breath sounds checked- equal and bilateral Secured at: 20 cm Tube secured with: Tape Dental Injury: Teeth and Oropharynx as per pre-operative assessment  Comments: Smooth IV Induction. Eyes taped. Easy mask. DL x 1 with grade 1 view. Atraumatically placed, teeth and lip remain intact as pre-op. Secured with tape. Bilateral breath sounds +/=, EtCO2 +, Adequate TV, VSS.

## 2024-05-17 NOTE — Progress Notes (Signed)
 PROGRESS NOTE    [name] [name]  FMW:[id number] DOB: 16-Oct-1927 DOA: 05/16/2024 PCP: Antonio Cyndee Jamee JONELLE, DO    Brief Narrative:  89 year old with history of hypertension, hypothyroidism, dementia from home presented with fall.  She was found on the floor with telephone cord wrapped around her leg.  In the emergency room hemodynamically stable.  She was found to have right intertrochanteric fracture.  Other skeletal survey was negative. 7/6, underwent ORIF right hip.  Subjective: Patient seen and examined.  She came back from procedure.  Her daughter was at the bedside.  Patient was looking comfortable however anxious and pulling on garments.  No other postop events.  Denies any pain and allowing to mobilize in the bed.  Assessment & Plan:   Closed traumatic right hip fracture: ORIF, intramedullary nail 7/6, Dr. Edna Weightbearing as tolerated Adequate pain medications, opiate pathway with bowel regimen DVT prophylaxis, Lovenox  to start 7/7. Postop follow-up as per surgery.  Essential hypertension: Continue lisinopril .  Hypothyroidism: On Synthroid .  Dementia with delirium and agitation: Continue Seroquel .  Fall precautions.  Delirium precautions. Seroquel  25 mg at night. Haloperidol  2 mg IV every 6 hours for agitation. Family to spend night with her.   DVT prophylaxis: SCDs Start: 05/16/24 1952   Code Status: DNR with limited intervention Family Communication: Daughter at the bedside Disposition Plan: Status is: Inpatient Remains inpatient appropriate because: Immediate postop     Consultants:  Orthopedics  Procedures:  ORIF right hip  Antimicrobials:  Perioperative     Objective: Vitals:   05/17/24 1045 05/17/24 1100 05/17/24 1115 05/17/24 1149  BP: (!) 178/65 (!) 126/59  (!) 115/54  Pulse: 78 64 66 (!) 59  Resp: 12 11 11    Temp:    97.6 F (36.4 C)  TempSrc:      SpO2: 93% 99% 98% 100%  Weight:      Height:        Intake/Output Summary  (Last 24 hours) at 05/17/2024 1243 Last data filed at 05/17/2024 0936 Gross per 24 hour  Intake 1775 ml  Output 100 ml  Net 1675 ml   Filed Weights   05/16/24 1413 05/17/24 0717  Weight: 49.9 kg 49.9 kg    Examination:  General: Frail elderly.  Slightly anxious.  Not in any distress. Cardiovascular: S1-S2 normal.  Regular rate rhythm. Respiratory: Bilateral clear.  No added sounds. Gastrointestinal: Soft.  Nontender.  Bowel sound present. Ext: No deformities.  Right lateral thigh incision clean and intact. Neuro: Alert and awake.  Oriented to herself.  Confused and anxious.    Data Reviewed: I have personally reviewed following labs and imaging studies  CBC: Recent Labs  Lab 05/16/24 1440  WBC 13.2*  NEUTROABS 11.6*  HGB 14.0  HCT 41.6  MCV 95.6  PLT 159   Basic Metabolic Panel: Recent Labs  Lab 05/16/24 1440  NA 142  K 4.2  CL 106  CO2 26  GLUCOSE 117*  BUN 27*  CREATININE 0.83  CALCIUM 9.3   GFR: Estimated Creatinine Clearance: 31.9 mL/min (by C-G formula based on SCr of 0.83 mg/dL). Liver Function Tests: Recent Labs  Lab 05/16/24 1440  AST 21  ALT 18  ALKPHOS 75  BILITOT 0.8  PROT 6.6  ALBUMIN 3.5   No results for input(s): LIPASE, AMYLASE in the last 168 hours. No results for input(s): AMMONIA in the last 168 hours. Coagulation Profile: Recent Labs  Lab 05/16/24 1440  INR 0.9   Cardiac Enzymes: Recent Labs  Lab  05/16/24 1440  CKTOTAL 71   BNP (last 3 results) No results for input(s): PROBNP in the last 8760 hours. HbA1C: No results for input(s): HGBA1C in the last 72 hours. CBG: No results for input(s): GLUCAP in the last 168 hours. Lipid Profile: No results for input(s): CHOL, HDL, LDLCALC, TRIG, CHOLHDL, LDLDIRECT in the last 72 hours. Thyroid  Function Tests: No results for input(s): TSH, T4TOTAL, FREET4, T3FREE, THYROIDAB in the last 72 hours. Anemia Panel: No results for input(s):  VITAMINB12, FOLATE, FERRITIN, TIBC, IRON, RETICCTPCT in the last 72 hours. Sepsis Labs: No results for input(s): PROCALCITON, LATICACIDVEN in the last 168 hours.  Recent Results (from the past 240 hours)  Surgical pcr screen     Status: None   Collection Time: 05/17/24  5:53 AM   Specimen: Nasal Mucosa; Nasal Swab  Result Value Ref Range Status   MRSA, PCR NEGATIVE NEGATIVE Final   Staphylococcus aureus NEGATIVE NEGATIVE Final    Comment: (NOTE) The Xpert SA Assay (FDA approved for NASAL specimens in patients 31 years of age and older), is one component of a comprehensive surveillance program. It is not intended to diagnose infection nor to guide or monitor treatment. Performed at [facility], [address] [place], [place] [address]          Radiology Studies: DG FEMUR PORT, MIN 2 VIEWS RIGHT Result Date: 05/17/2024 CLINICAL DATA:  Postop fixation of intertrochanteric right femur fracture. EXAM: RIGHT FEMUR PORTABLE 2 VIEW COMPARISON:  Radiographs 05/16/2024. FINDINGS: Four portable views of the right femur demonstrate recent right femoral ORIF with an intramedullary nail, dynamic femoral neck screws and 2 distal interlocking screws. The hardware is well positioned. Improved alignment of the comminuted intertrochanteric femur fracture. No complications are identified. There are moderate tricompartmental degenerative changes at the right knee. A small amount of soft tissue emphysema is noted lateral to the right hip. Scattered vascular calcifications are present. IMPRESSION: Status post ORIF of comminuted intertrochanteric right femur fracture with improved alignment. No complications identified. Electronically Signed   By: Elsie Perone M.D.   On: 05/17/2024 11:21   DG HIP UNILAT WITH PELVIS 2-3 VIEWS RIGHT Result Date: 05/17/2024 CLINICAL DATA:  Intertrochanteric femur fracture post fixation. EXAM: DG HIP (WITH OR WITHOUT PELVIS) 2-3V RIGHT COMPARISON:   Radiographs 05/16/2024. FINDINGS: C-arm fluoroscopy was provided in the operating room without the presence of a radiologist.2 minutes and 10 seconds fluoroscopy time. 10.75 mGy air kerma. Eleven C-arm fluoroscopic images were obtained intraoperatively and are submitted for post operative interpretation. Please see intraoperative findings for further detail. Postsurgical radiographs of the pelvis and right hip demonstrate a right femoral intramedullary nail with dynamic interlocking screws in the femoral neck. There is improved alignment of the intertrochanteric femur fracture. The hardware appears well positioned. No complications are identified. There is a small amount of gas in the lateral soft tissues. IMPRESSION: Intraoperative fluoroscopic guidance for right femoral fracture fixation. No complications identified. Electronically Signed   By: Elsie Perone M.D.   On: 05/17/2024 11:18   DG C-Arm 1-60 Min-No Report Result Date: 05/17/2024 Fluoroscopy was utilized by the requesting physician.  No radiographic interpretation.   CT CERVICAL SPINE WO CONTRAST Result Date: 05/16/2024 CLINICAL DATA:  Neck trauma (Age >= 65y).  Fall. EXAM: CT CERVICAL SPINE WITHOUT CONTRAST TECHNIQUE: Multidetector CT imaging of the cervical spine was performed without intravenous contrast. Multiplanar CT image reconstructions were also generated. RADIATION DOSE REDUCTION: This exam was performed according to the departmental dose-optimization program which  includes automated exposure control, adjustment of the mA and/or kV according to patient size and/or use of iterative reconstruction technique. COMPARISON:  08/27/2023 FINDINGS: Alignment: 2 mm anterolisthesis of C2 on C3 related to degenerative facet disease. Skull base and vertebrae: No acute fracture. No primary bone lesion or focal pathologic process. Soft tissues and spinal canal: No prevertebral fluid or swelling. No visible canal hematoma. Disc levels: Disc space  narrowing and spurring most notable from C3-4 through C6-7. Moderate bilateral degenerative facet disease. Left greater than right. Multilevel bilateral neural foraminal narrowing, left greater than right. Upper chest: No acute findings Other: None IMPRESSION: Degenerative disc and facet disease. No acute bony abnormality. Electronically Signed   By: Franky Crease M.D.   On: 05/16/2024 17:23   CT HEAD WO CONTRAST Result Date: 05/16/2024 CLINICAL DATA:  Head trauma, minor (Age >= 65y).  Fall EXAM: CT HEAD WITHOUT CONTRAST TECHNIQUE: Contiguous axial images were obtained from the base of the skull through the vertex without intravenous contrast. RADIATION DOSE REDUCTION: This exam was performed according to the departmental dose-optimization program which includes automated exposure control, adjustment of the mA and/or kV according to patient size and/or use of iterative reconstruction technique. COMPARISON:  04/27/2024 FINDINGS: Brain: No acute intracranial abnormality. Specifically, no hemorrhage, hydrocephalus, mass lesion, acute infarction, or significant intracranial injury. There is atrophy and chronic small vessel disease changes. Vascular: No hyperdense vessel or unexpected calcification. Skull: No acute calvarial abnormality. Sinuses/Orbits: No acute findings Other: None IMPRESSION: Atrophy, chronic microvascular disease. No acute intracranial abnormality. Electronically Signed   By: Franky Crease M.D.   On: 05/16/2024 17:22   DG Hip Unilat With Pelvis 2-3 Views Right Result Date: 05/16/2024 CLINICAL DATA:  Unwitnessed fall EXAM: DG HIP (WITH OR WITHOUT PELVIS) 2-3V RIGHT COMPARISON:  04/27/2024 FINDINGS: SI joints are non widened. Pubic symphysis and rami appear intact. Moderate left hip degenerative change. Acute comminuted and displaced right intertrochanteric fracture with varus angulation of distal fracture fragment. No femoral head dislocation IMPRESSION: Acute comminuted and displaced right  intertrochanteric fracture. Electronically Signed   By: Luke Bun M.D.   On: 05/16/2024 15:33   DG Chest Port 1 View Result Date: 05/16/2024 CLINICAL DATA:  Unwitnessed fall EXAM: PORTABLE CHEST 1 VIEW COMPARISON:  04/27/2024 FINDINGS: Mild chronic bronchitic changes. No acute airspace disease or effusion. Mitral annular calcification. Stable cardiomediastinal silhouette with aortic atherosclerosis. No pneumothorax IMPRESSION: No active disease. Mild chronic bronchitic changes. Electronically Signed   By: Luke Bun M.D.   On: 05/16/2024 15:31        Scheduled Meds:  levothyroxine   125 mcg Oral Q0600   lisinopril   20 mg Oral Daily   QUEtiapine   25 mg Oral QHS   Continuous Infusions:   LOS: 1 day    Time spent: 40 minutes    Renato Applebaum, MD [facility]

## 2024-05-17 NOTE — Plan of Care (Signed)

## 2024-05-17 NOTE — Op Note (Signed)
 DATE OF SURGERY:  05/17/2024  TIME: 8:56 AM  PATIENT NAME:  [name] [name]  AGE: 89 y.o.  PRE-OPERATIVE DIAGNOSIS:  Right Hip Fracture  POST-OPERATIVE DIAGNOSIS:  SAME  PROCEDURE:  FIXATION, FRACTURE, INTERTROCHANTERIC, WITH INTRAMEDULLARY ROD  SURGEON:  Shelvia Fojtik A Decklan Mau  ASSISTANT: Bernarda Mclean, PA-C was present and scrubbed throughout the case, critical for assistance with exposure, retraction, instrumentation, and closure.  OPERATIVE IMPLANTS:  [employer] Intertan Nail 10 x 400 mm , 95/90 lag compression screw, 2 distal interlock screws  Implant Name Type Inv. Item Serial No. Manufacturer Lot No. LRB No. Used Action  NAIL LOCK CANN 10X400 130D RT - [id number] Nail NAIL LOCK CANN 10X400 130D RT  [employer] [id number] Right 1 Implanted  SCREW LAG COMPR KIT 95/90 - [id number] Screw SCREW LAG COMPR KIT 95/90  [employer] [id number] Right 1 Implanted  SCREW TRIGEN LOW PROF 5.0X35 - [id number] Screw SCREW TRIGEN LOW PROF 5.0X35  [employer] [id number] Right 1 Implanted  SCREW TRIGEN LOW PROF 5.0X40 - [id number] Screw SCREW TRIGEN LOW PROF 5.0X40  [employer] [id number] Right 1 Implanted    UNIQUE ASPECTS OF THE CASE: Reverse obliquity inotrope fracture elected for use of long Seph medullary nail with 2 distal interlocks overall poor bone quality  ESTIMATED BLOOD LOSS: 100cc  PREOPERATIVE INDICATIONS:  [name] [name] is a 89 y.o. year old who fell and suffered an Right Hip Fracture. She was brought into the ER and then admitted and optimized and then elected for surgical intervention.    The risks benefits and alternatives were discussed with the patient including but not limited to the risks of nonoperative treatment, versus surgical intervention including infection, bleeding, nerve injury, malunion, nonunion, hardware prominence, hardware failure, need for hardware removal, blood clots, cardiopulmonary  complications, morbidity, mortality, among others, and they were willing to proceed.    OPERATIVE PROCEDURE:  The patient was brought to the operating room and placed in the supine position. Anesthesia was administered. She was placed on the fracture table.  Closed reduction was performed under C-arm guidance.  Time out was then performed after sterile prep and drape. She received preoperative antibiotics.  Small incision proximal to the greater trochanter was made and carried down through skin and subcutaneous tissue.  Threaded guidewire was directed at the tip of the greater trochanter and advanced into the proximal metaphysis.  Positioning was confirmed with fluoroscopy.  I then used an entry reamer to enter the medullary canal.  I passed the guidewire down the canal.  I reamed sequentially up to 11.5 mm which had good chatter.  I then passed a 10 x 400 mm InterTAN down the center of the canal attached to the targeting arm.  I then used the targeting arm to make a percutaneous incision and directed a threaded guidewire up into the head/neck segment.  I confirmed adequate tip apex distance and measured the length.  I decided to place a 95 mm screw.  I then drilled the path for the compression screw and placed an antirotation bar.  I then placed the lag screw and then placed the compression screw and compressed approximately 5 mm.  The proximal portion of the nail was statically locked.   Targeting arm was then removed and we did around the world x-rays under fluoroscopy to confirm appropriate center center position of the Seph medullary screw and good reduction of the fracture.  We then placed 2 distal interlocks using  perfect circle technique.  These had overall poor purchase and bone quality.  The wounds were irrigated copiously, and vancomycin  powder was placed in the wounds.  The gluteal fascia was closed with 0 Vicryl, and skin was closed with 2-0 Vicryl and 3-0 Monocryl.  Sterile dressing was  applied with Dermabond and Mepilex dressing.  The patient was awakened and returned to PACU in stable and satisfactory condition. There were no complications and the patient tolerated the procedure well.   Post op recs: WB: WBAT RLE Abx: ancef  x23 hours post op Imaging: PACU xrays Dressing: keep intact until follow up, change PRN if soiled or saturated. DVT prophylaxis: lovenox  starting POD1 x4 weeks Follow up: 2 weeks after surgery for a wound check with Dr. Edna at [facility].  Address: [address], [place], [place] [address]  Office Phone: [phone]  Toribio Edna, MD Orthopaedic Surgery

## 2024-05-17 NOTE — Interval H&P Note (Signed)
 The patient has been re-examined, and the chart reviewed, and there have been no interval changes to the documented history and physical.    Plan for ORIF R intertroch fracture with CMN  The operative side was examined and the patient was confirmed to have sensation to DPN, SPN, TN intact, Motor EHL, ext, flex 5/5, and DP 2+, PT 2+, No significant edema.   The risks, benefits, and alternatives have been discussed at length with patient and her family, and the patient is willing to proceed.  Right hip marked. Consent has been signed.

## 2024-05-17 NOTE — Anesthesia Preprocedure Evaluation (Signed)
 Anesthesia Evaluation  Patient identified by MRN, date of birth, ID band Patient awake    Reviewed: Allergy & Precautions, H&P , NPO status , Patient's Chart, lab work & pertinent test results  Airway Mallampati: III   Neck ROM: full    Dental   Pulmonary neg pulmonary ROS   breath sounds clear to auscultation       Cardiovascular hypertension,  Rhythm:regular Rate:Normal     Neuro/Psych  PSYCHIATRIC DISORDERS Anxiety    Dementia    GI/Hepatic   Endo/Other  Hypothyroidism    Renal/GU      Musculoskeletal  (+) Arthritis ,    Abdominal   Peds  Hematology   Anesthesia Other Findings   Reproductive/Obstetrics                              Anesthesia Physical Anesthesia Plan  ASA: 3  Anesthesia Plan: General   Post-op Pain Management:    Induction: Intravenous  PONV Risk Score and Plan: 3 and Ondansetron , Dexamethasone  and Treatment may vary due to age or medical condition  Airway Management Planned: Oral ETT  Additional Equipment:   Intra-op Plan:   Post-operative Plan: Extubation in OR  Informed Consent: I have reviewed the patients History and Physical, chart, labs and discussed the procedure including the risks, benefits and alternatives for the proposed anesthesia with the patient or authorized representative who has indicated his/her understanding and acceptance.     Dental advisory given  Plan Discussed with: CRNA, Anesthesiologist and Surgeon  Anesthesia Plan Comments:         Anesthesia Quick Evaluation

## 2024-05-17 NOTE — Transfer of Care (Signed)
 Immediate Anesthesia Transfer of Care Note  Patient: [name] [name]  Procedure(s) Performed: FIXATION, FRACTURE, INTERTROCHANTERIC, WITH INTRAMEDULLARY ROD (Right)  Patient Location: PACU  Anesthesia Type:General  Level of Consciousness: drowsy  Airway & Oxygen Therapy: Patient Spontanous Breathing and Patient connected to face mask oxygen  Post-op Assessment: Report given to RN and Post -op Vital signs reviewed and stable  Post vital signs: Reviewed and stable  Last Vitals:  Vitals Value Taken Time  BP 167/77 05/17/24 09:40  Temp 36.3 C 05/17/24 09:40  Pulse 72 05/17/24 09:45  Resp 14 05/17/24 09:45  SpO2 97 % 05/17/24 09:45  Vitals shown include unfiled device data.  Last Pain:  Vitals:   05/17/24 0940  TempSrc:   PainSc: 0-No pain      Patients Stated Pain Goal: 0 (05/17/24 0006)  Complications: No notable events documented.

## 2024-05-17 NOTE — Discharge Instructions (Signed)
 Orthopedic Discharge Instructions  Diet: As you were doing prior to hospitalization   Shower:  May shower but keep the wounds dry, use an occlusive plastic wrap, NO SOAKING IN TUB.  If the bandage gets wet, change with a clean dry gauze.  If you have a splint on, leave the splint in place and keep the splint dry with a plastic bag.  Dressing:  You may change your dressing 3-5 days after surgery.  If the dressing remains clean and dry it can also be left on until follow up.  If you change the dressing replace with clean gauze and tape or ace wrap.  For most surgeries, the stitches used are dissolvable and don't need to be removed.  However, depending on your surgery, you may have stitches that will need to be removed in the office in about 2 weeks.    Activity:  Increase activity slowly as tolerated, but follow the weight bearing instructions below.  Do not drive for the next 4-6 weeks.  In addition, you cannot be taking narcotics while you drive, and you must feel in control of the vehicle.    Weight Bearing:   You may bear weight on your surgical leg as tolerated.    Blood clot prevention (DVT Prophylaxis): After surgery you are at an increased risk for a blood clot. you were prescribed a blood thinner, lovenox  40mg , to be taken once daily for a total of 4 weeks from surgery to help reduce your risk of getting a blood clot. Signs of a pulmonary embolus (blood clot in the lungs) include sudden short of breath, feeling lightheaded or dizzy, chest pain with a deep breath, rapid pulse rapid breathing.  Signs of a blood clot in your arms or legs include new unexplained swelling and cramping, warm, red or darkened skin around the painful area.  Please call the office or 911 right away if these signs or symptoms develop.  To prevent constipation: you may use a stool softener such as - Colace (over the counter) 100 mg by mouth twice a day  Drink plenty of fluids (prune juice may be helpful) and high fiber  foods Miralax (over the counter) for constipation as needed.    Itching:  If you experience itching with your medications, try taking only a single pain pill, or even half a pain pill at a time.  You may take up to 10 pain pills per day, and you can also use benadryl over the counter for itching or also to help with sleep.   Precautions:  If you experience chest pain or shortness of breath - call 911 immediately for transfer to the hospital emergency department!!  Call office [phone]) for the following: Temperature greater than 101F Persistent nausea and vomiting Severe uncontrolled pain Redness, tenderness, or signs of infection (pain, swelling, redness, odor or green/yellow discharge around the site) Difficulty breathing, headache or visual disturbances Hives Persistent dizziness or light-headedness Extreme fatigue Any other questions or concerns you may have after discharge  In an emergency, call 911 or go to an Emergency Department at a nearby hospital  Follow- Up Appointment:  Please call for an appointment to be seen approximately 2-3 week after surgery in [facility] with your surgeon Dr. Toribio Higashi - [phone] Address: [address], [place], [place] [address]

## 2024-05-18 ENCOUNTER — Telehealth: Payer: Self-pay

## 2024-05-18 DIAGNOSIS — S72141A Displaced intertrochanteric fracture of right femur, initial encounter for closed fracture: Secondary | ICD-10-CM | POA: Diagnosis not present

## 2024-05-18 MED ORDER — SODIUM CHLORIDE 0.9 % IV SOLN: INTRAVENOUS | Status: AC

## 2024-05-18 MED ORDER — ACETAMINOPHEN 500 MG PO TABS
1000.0000 mg | ORAL_TABLET | Freq: Four times a day (QID) | ORAL | Status: DC
  Administered 2024-05-19 – 2024-05-22 (×10): 1000 mg via ORAL
  Filled 2024-05-18 (×13): qty 2

## 2024-05-18 MED ORDER — ENSURE PLUS HIGH PROTEIN PO LIQD
237.0000 mL | Freq: Two times a day (BID) | ORAL | Status: DC
  Administered 2024-05-19 – 2024-05-22 (×6): 237 mL via ORAL

## 2024-05-18 MED ORDER — OXYCODONE HCL 5 MG PO TABS: 2.5000 mg | ORAL_TABLET | Freq: Four times a day (QID) | ORAL | Status: DC | PRN

## 2024-05-18 MED ORDER — THIAMINE MONONITRATE 100 MG PO TABS
100.0000 mg | ORAL_TABLET | Freq: Every day | ORAL | Status: DC
  Administered 2024-05-19 – 2024-05-22 (×4): 100 mg via ORAL
  Filled 2024-05-18 (×4): qty 1

## 2024-05-18 MED ORDER — VITAMIN D 25 MCG (1000 UNIT) PO TABS
1000.0000 [IU] | ORAL_TABLET | Freq: Every day | ORAL | Status: DC
  Administered 2024-05-19 – 2024-05-22 (×4): 1000 [IU] via ORAL
  Filled 2024-05-18 (×4): qty 1

## 2024-05-18 MED ORDER — ADULT MULTIVITAMIN W/MINERALS CH
1.0000 | ORAL_TABLET | Freq: Every day | ORAL | Status: DC
  Administered 2024-05-19 – 2024-05-22 (×4): 1 via ORAL
  Filled 2024-05-18 (×4): qty 1

## 2024-05-18 NOTE — Progress Notes (Signed)
     Subjective:  Patient seen this afternoon. Very drowsy and sleepy. Did not wake up enough to participate with PT. Daughters at bedside are concerned and dissapointed.  Objective:   VITALS:   Vitals:   05/17/24 2025 05/18/24 0617 05/18/24 0720 05/18/24 1629  BP:  (!) 105/44 (!) 143/96 (!) 111/40  Pulse: 74 78 95 70  Resp: 16 16  14   Temp: 97.6 F (36.4 C) 97.7 F (36.5 C) 97.8 F (36.6 C) 98.7 F (37.1 C)  TempSrc: Oral Axillary  Axillary  SpO2: 97% 97% 98% 100%  Weight:      Height:        Intact pulses distally Incision: dressing C/D/I Compartment soft Patient not participating with motorsensory exam today.  Lab Results  Component Value Date   WBC 13.8 (H) 05/17/2024   HGB 11.9 (L) 05/17/2024   HCT 36.9 05/17/2024   MCV 95.1 05/17/2024   PLT 151 05/17/2024   BMET    Component Value Date/Time   NA 135 05/17/2024 1245   K 4.7 05/17/2024 1245   CL 101 05/17/2024 1245   CO2 23 05/17/2024 1245   GLUCOSE 155 (H) 05/17/2024 1245   BUN 19 05/17/2024 1245   CREATININE 0.87 05/17/2024 1245   CREATININE 0.82 09/09/2020 1119   CALCIUM 8.8 (L) 05/17/2024 1245   GFRNONAA >60 05/17/2024 1245      Xray: hip nail in good alignment no adverse features  Assessment/Plan: 1 Day Post-Op   Principal Problem:   Closed displaced intertrochanteric fracture of right femur, initial encounter (HCC) Active Problems:   Hypothyroidism   Essential hypertension   Mild dementia with agitation (HCC)  S/p R intertroch fx ORIF 05/17/24  POD 1: patient very drowsy. Dc'd morphine  and hydrocodone  5-10 and replaced with oxy 2.5. Patient also on scheduled tylenol . Could consider toradol if alternative pain med required. Seroquel  and haldol  also ordered per med team for night time agitation.  Post op recs: WB: WBAT RLE Abx: ancef  x23 hours post op Imaging: PACU xrays Dressing: keep intact until follow up, change PRN if soiled or saturated. DVT prophylaxis: lovenox  starting POD1 x4  weeks Follow up: 2 weeks after surgery for a wound check with Dr. Edna at [facility].  Address: [address], [place], [place] [address]  Office Phone: [phone]    TORIBIO DELENA EDNA 05/18/2024, 4:31 PM   TORIBIO Edna, MD  Contact information:   [phone] 7am-5pm epic message Dr. Edna, or call office for patient follow up: [phone] After hours and holidays please check [email or web address] for group call information for [facility]

## 2024-05-18 NOTE — Plan of Care (Signed)
   Problem: Education: Goal: Knowledge of General Education information will improve Description: Including pain rating scale, medication(s)/side effects and non-pharmacologic comfort measures Outcome: Progressing   Problem: Safety: Goal: Ability to remain free from injury will improve Outcome: Progressing   Problem: Skin Integrity: Goal: Risk for impaired skin integrity will decrease Outcome: Progressing

## 2024-05-18 NOTE — Evaluation (Signed)
 Physical Therapy Evaluation Patient Details Name: [name] [name] MRN: [id number] DOB: [birth year] Today's Date: 05/18/2024  History of Present Illness  89 y.o. female presented to the ED 7/5 for evaluation after a fall at home. S/P 7/6 R hip ORIF PMH: HTN, hypothyroidism, dementia  Clinical Impression  Pt asleep on entry, daughter provided home set up and level of function information. Pt with decreased response to PROM of her LE although increase resistance to movement of R hip. PT able to get her to side of bed with total A. Pt rouses briefly but requires total A for R posterior lean. Pt sat ~5 min before increasing R lateral lean. Pt requires total A for returning to supine. Pt's daughter with increased concern about pt ability to participate in therapy and prognosis for ambulation and independence in the future. MD aware of level of arousal and working to adjust medication for pain relief and arousal needed for therapy. Daughter would like pt to go home with resumption of HHPT as soon as possible to return pt to her familiar surroundings. PT in agreement. PT will see tomorrow with hopes for better level of arousal.         If plan is discharge home, recommend the following: A lot of help with walking and/or transfers;A lot of help with bathing/dressing/bathroom;Assistance with cooking/housework;Direct supervision/assist for medications management;Direct supervision/assist for financial management;Assist for transportation;Help with stairs or ramp for entrance;Supervision due to cognitive status   Can travel by private vehicle    No    Equipment Recommendations Rolling walker (2 wheels);BSC/3in1  Recommendations for Other Services  OT consult;Speech consult    Functional Status Assessment Patient has had a recent decline in their functional status and demonstrates the ability to make significant improvements in function in a reasonable and predictable amount of time.     Precautions /  Restrictions Precautions Precautions: Fall Restrictions Weight Bearing Restrictions Per Provider Order: Yes      Mobility  Bed Mobility Overal bed mobility: Needs Assistance Bed Mobility: Supine to Sit, Sit to Supine     Supine to sit: Total assist, HOB elevated (HOB maximally elevated) Sit to supine: Total assist   General bed mobility comments: pt requires total A to come to EoB and for return to supine, sat EoB    Transfers                   General transfer comment: unable due to level of arousal       Balance Overall balance assessment: History of Falls, Needs assistance Sitting-balance support: No upper extremity supported, Feet supported Sitting balance-Leahy Scale: Zero Sitting balance - Comments: requires total A to maintain seated balance                                     Pertinent Vitals/Pain Pain Assessment Pain Assessment: Faces Faces Pain Scale: Hurts even more Pain Location: R hip with flattening the bed Pain Descriptors / Indicators: Grimacing, Guarding, Moaning Pain Intervention(s): Limited activity within patient's tolerance, Monitored during session, Repositioned, Premedicated before session    Home Living Family/patient expects to be discharged to:: Private residence Living Arrangements: Alone Available Help at Discharge: Family;Available 24 hours/day Type of Home: House Home Access: Stairs to enter Entrance Stairs-Rails: Left Entrance Stairs-Number of Steps: 2   Home Layout: One level Home Equipment: Toilet riser;Grab bars - toilet;Grab bars - tub/shower;Rollator (4 wheels);Standard Walker Additional Comments:  has caregivers and family throughout the day caregiver puts her in bed and caregiver there to wake her up    Prior Function Prior Level of Function : Needs assist  Cognitive Assist : ADLs (cognitive)           Mobility Comments: uses Rollator in home, and standard walker in driveway ADLs Comments: does  her own bathing and dressing, caregiver does cooking, laundry     Extremity/Trunk Assessment   Upper Extremity Assessment Upper Extremity Assessment: Defer to OT evaluation    Lower Extremity Assessment Lower Extremity Assessment: RLE deficits/detail RLE Deficits / Details: R LE in external rotation and lacking full extension in resting, pt with grimace and stiffening when adjusting it in bed, unable to assess AROM and strength    Cervical / Trunk Assessment Cervical / Trunk Assessment: Kyphotic  Communication   Communication Communication: Other (comment) Factors Affecting Communication: Other (comment);Reduced clarity of speech (level of arousal)    Cognition Arousal: Stuporous Behavior During Therapy: Flat affect   PT - Cognitive impairments: Difficult to assess Difficult to assess due to: Level of arousal                     PT - Cognition Comments: pt barely responds to her name during session Following commands: Impaired Following commands impaired:  (does not follow commands)     Cueing Cueing Techniques: Verbal cues, Gestural cues, Tactile cues, Visual cues     General Comments General comments (skin integrity, edema, etc.): pt completely stuporous, only rouses to noxious stimuli and then barely, pain of movement of pt back to bed rouses her slightly but then quickly falls back to sleep        Assessment/Plan    PT Assessment Patient needs continued PT services  PT Problem List Other (comment);Decreased balance;Decreased activity tolerance;Pain (level of arousal)       PT Treatment Interventions DME instruction;Gait training;Stair training;Functional mobility training;Therapeutic activities;Therapeutic exercise;Balance training;Cognitive remediation;Patient/family education    PT Goals (Current goals can be found in the Care Plan section)  Acute Rehab PT Goals PT Goal Formulation: With family Time For Goal Achievement: 06/01/24 Potential to  Achieve Goals: Fair    Frequency Min 2X/week        AM-PAC PT 6 Clicks Mobility  Outcome Measure Help needed turning from your back to your side while in a flat bed without using bedrails?: Total Help needed moving from lying on your back to sitting on the side of a flat bed without using bedrails?: Total Help needed moving to and from a bed to a chair (including a wheelchair)?: Total Help needed standing up from a chair using your arms (e.g., wheelchair or bedside chair)?: Total Help needed to walk in hospital room?: Total Help needed climbing 3-5 steps with a railing? : Total 6 Click Score: 6    End of Session   Activity Tolerance: Treatment limited secondary to medical complications (Comment) (level of arousal) Patient left: in bed;with call bell/phone within reach;with bed alarm set;with family/visitor present Nurse Communication: Mobility status PT Visit Diagnosis: Muscle weakness (generalized) (M62.81);Pain;Other abnormalities of gait and mobility (R26.89)    Time: 8898-8862 PT Time Calculation (min) (ACUTE ONLY): 36 min   Charges:   PT Evaluation $PT Eval Moderate Complexity: 1 Mod PT Treatments $Therapeutic Activity: 8-22 mins PT General Charges $$ ACUTE PT VISIT: 1 Visit         Nyasiah Moffet B. Fleeta Lapidus PT, DPT Acute Rehabilitation Services Please use secure chat  or  Call Office [phone]   Almarie KATHEE Salinas Fleet 05/18/2024, 1:31 PM

## 2024-05-18 NOTE — Plan of Care (Signed)

## 2024-05-18 NOTE — Progress Notes (Signed)
 SLP Cancellation Note  Patient Details Name: [name] [name] MRN: [id number] DOB: [birth year]   Cancelled treatment:       Reason Eval/Treat Not Completed: Fatigue/lethargy limiting ability to participate. RN reports pt drowsy following anesthesia, haldol , and ativan  given since yesterday. Will continue efforts.   Itzael Liptak B. Dory, MSP, CCC-SLP Speech Language Pathologist Office: [phone]  Dory Caprice Daring 05/18/2024, 1:36 PM

## 2024-05-18 NOTE — Progress Notes (Signed)
 PROGRESS NOTE    ODESTER NILSON  FMW:[id number] DOB: Aug 20, 1927 DOA: 05/16/2024 PCP: Antonio Cyndee Jamee JONELLE, DO    Brief Narrative:  89 year old with history of hypertension, hypothyroidism, dementia from home presented with fall.  She was found on the floor with telephone cord wrapped around her leg.  In the emergency room hemodynamically stable.  She was found to have right intertrochanteric fracture.  Other skeletal survey was negative. 7/6, underwent ORIF right hip.  Subjective: Patient seen and examined.  Overnight she had some agitation and required 1 dose of Ativan  and 1 dose of haloperidol .  She was slightly fidgety but looks overall comfortable.  She was trying to keep up conversation however not very clear.  No family at the bedside in the morning rounds.   Assessment & Plan:   Closed traumatic right hip fracture: ORIF, intramedullary nail 7/6, Dr. Edna Weightbearing as tolerated Adequate pain medications, opiate pathway with bowel regimen DVT prophylaxis, Lovenox  subcu Postop follow-up as per surgery.  Essential hypertension: Continue lisinopril .  Hypothyroidism: On Synthroid .  Dementia with delirium and agitation: Continue Seroquel .  Fall precautions.  Delirium precautions. Seroquel  25 mg at night. Haloperidol  2 mg IV every 6 hours for agitation. Family to spend night with her as much possible. Start mobilizing with PT OT.  Leukocytosis: Likely stress reaction.  No evidence of localizing infection.  DVT prophylaxis: enoxaparin  (LOVENOX ) injection 40 mg Start: 05/18/24 0900 SCDs Start: 05/16/24 1952   Code Status: DNR with limited intervention Family Communication: None today. Disposition Plan: Status is: Inpatient Remains inpatient appropriate because: Immediate postop     Consultants:  Orthopedics  Procedures:  ORIF right hip  Antimicrobials:  Perioperative     Objective: Vitals:   05/17/24 1541 05/17/24 2025 05/18/24 0617 05/18/24 0720   BP: (!) 154/74 (!) 123/43 (!) 105/44 (!) 143/96  Pulse: 95 74 78 95  Resp:  16 16   Temp:  97.6 F (36.4 C) 97.7 F (36.5 C) 97.8 F (36.6 C)  TempSrc:  Oral Axillary   SpO2: 99% 97% 97% 98%  Weight:      Height:        Intake/Output Summary (Last 24 hours) at 05/18/2024 1126 Last data filed at 05/18/2024 0700 Gross per 24 hour  Intake 195.13 ml  Output 700 ml  Net -504.87 ml   Filed Weights   05/16/24 1413 05/17/24 0717  Weight: 49.9 kg 49.9 kg    Examination:  General: Frail and age-appropriate.  Slightly anxious and restless. Cardiovascular: S1-S2 normal.  Regular rate rhythm. Respiratory: Bilateral clear.  No added sounds. Gastrointestinal: Soft.  Nontender.  Bowel sound present. Ext: No deformities.  Right lateral thigh incision clean and intact. Neuro: Alert and awake.  Oriented to herself.  Confused and anxious.    Data Reviewed: I have personally reviewed following labs and imaging studies  CBC: Recent Labs  Lab 05/16/24 1440 05/17/24 1245  WBC 13.2* 13.8*  NEUTROABS 11.6*  --   HGB 14.0 11.9*  HCT 41.6 36.9  MCV 95.6 95.1  PLT 159 151   Basic Metabolic Panel: Recent Labs  Lab 05/16/24 1440 05/17/24 1245  NA 142 135  K 4.2 4.7  CL 106 101  CO2 26 23  GLUCOSE 117* 155*  BUN 27* 19  CREATININE 0.83 0.87  CALCIUM 9.3 8.8*   GFR: Estimated Creatinine Clearance: 30.5 mL/min (by C-G formula based on SCr of 0.87 mg/dL). Liver Function Tests: Recent Labs  Lab 05/16/24 1440  AST 21  ALT  18  ALKPHOS 75  BILITOT 0.8  PROT 6.6  ALBUMIN 3.5   No results for input(s): LIPASE, AMYLASE in the last 168 hours. No results for input(s): AMMONIA in the last 168 hours. Coagulation Profile: Recent Labs  Lab 05/16/24 1440  INR 0.9   Cardiac Enzymes: Recent Labs  Lab 05/16/24 1440  CKTOTAL 71   BNP (last 3 results) No results for input(s): PROBNP in the last 8760 hours. HbA1C: No results for input(s): HGBA1C in the last 72  hours. CBG: No results for input(s): GLUCAP in the last 168 hours. Lipid Profile: No results for input(s): CHOL, HDL, LDLCALC, TRIG, CHOLHDL, LDLDIRECT in the last 72 hours. Thyroid  Function Tests: No results for input(s): TSH, T4TOTAL, FREET4, T3FREE, THYROIDAB in the last 72 hours. Anemia Panel: No results for input(s): VITAMINB12, FOLATE, FERRITIN, TIBC, IRON, RETICCTPCT in the last 72 hours. Sepsis Labs: No results for input(s): PROCALCITON, LATICACIDVEN in the last 168 hours.  Recent Results (from the past 240 hours)  Surgical pcr screen     Status: None   Collection Time: 05/17/24  5:53 AM   Specimen: Nasal Mucosa; Nasal Swab  Result Value Ref Range Status   MRSA, PCR NEGATIVE NEGATIVE Final   Staphylococcus aureus NEGATIVE NEGATIVE Final    Comment: (NOTE) The Xpert SA Assay (FDA approved for NASAL specimens in patients 38 years of age and older), is one component of a comprehensive surveillance program. It is not intended to diagnose infection nor to guide or monitor treatment. Performed at [facility], [address], [place], [place] [address]          Radiology Studies: DG FEMUR PORT, MIN 2 VIEWS RIGHT Result Date: 05/17/2024 CLINICAL DATA:  Postop fixation of intertrochanteric right femur fracture. EXAM: RIGHT FEMUR PORTABLE 2 VIEW COMPARISON:  Radiographs 05/16/2024. FINDINGS: Four portable views of the right femur demonstrate recent right femoral ORIF with an intramedullary nail, dynamic femoral neck screws and 2 distal interlocking screws. The hardware is well positioned. Improved alignment of the comminuted intertrochanteric femur fracture. No complications are identified. There are moderate tricompartmental degenerative changes at the right knee. A small amount of soft tissue emphysema is noted lateral to the right hip. Scattered vascular calcifications are present. IMPRESSION: Status post ORIF of comminuted  intertrochanteric right femur fracture with improved alignment. No complications identified. Electronically Signed   By: Elsie Perone M.D.   On: 05/17/2024 11:21   DG HIP UNILAT WITH PELVIS 2-3 VIEWS RIGHT Result Date: 05/17/2024 CLINICAL DATA:  Intertrochanteric femur fracture post fixation. EXAM: DG HIP (WITH OR WITHOUT PELVIS) 2-3V RIGHT COMPARISON:  Radiographs 05/16/2024. FINDINGS: C-arm fluoroscopy was provided in the operating room without the presence of a radiologist.2 minutes and 10 seconds fluoroscopy time. 10.75 mGy air kerma. Eleven C-arm fluoroscopic images were obtained intraoperatively and are submitted for post operative interpretation. Please see intraoperative findings for further detail. Postsurgical radiographs of the pelvis and right hip demonstrate a right femoral intramedullary nail with dynamic interlocking screws in the femoral neck. There is improved alignment of the intertrochanteric femur fracture. The hardware appears well positioned. No complications are identified. There is a small amount of gas in the lateral soft tissues. IMPRESSION: Intraoperative fluoroscopic guidance for right femoral fracture fixation. No complications identified. Electronically Signed   By: Elsie Perone M.D.   On: 05/17/2024 11:18   DG C-Arm 1-60 Min-No Report Result Date: 05/17/2024 Fluoroscopy was utilized by the requesting physician.  No radiographic interpretation.   CT CERVICAL SPINE  WO CONTRAST Result Date: 05/16/2024 CLINICAL DATA:  Neck trauma (Age >= 65y).  Fall. EXAM: CT CERVICAL SPINE WITHOUT CONTRAST TECHNIQUE: Multidetector CT imaging of the cervical spine was performed without intravenous contrast. Multiplanar CT image reconstructions were also generated. RADIATION DOSE REDUCTION: This exam was performed according to the departmental dose-optimization program which includes automated exposure control, adjustment of the mA and/or kV according to patient size and/or use of iterative  reconstruction technique. COMPARISON:  08/27/2023 FINDINGS: Alignment: 2 mm anterolisthesis of C2 on C3 related to degenerative facet disease. Skull base and vertebrae: No acute fracture. No primary bone lesion or focal pathologic process. Soft tissues and spinal canal: No prevertebral fluid or swelling. No visible canal hematoma. Disc levels: Disc space narrowing and spurring most notable from C3-4 through C6-7. Moderate bilateral degenerative facet disease. Left greater than right. Multilevel bilateral neural foraminal narrowing, left greater than right. Upper chest: No acute findings Other: None IMPRESSION: Degenerative disc and facet disease. No acute bony abnormality. Electronically Signed   By: Franky Crease M.D.   On: 05/16/2024 17:23   CT HEAD WO CONTRAST Result Date: 05/16/2024 CLINICAL DATA:  Head trauma, minor (Age >= 65y).  Fall EXAM: CT HEAD WITHOUT CONTRAST TECHNIQUE: Contiguous axial images were obtained from the base of the skull through the vertex without intravenous contrast. RADIATION DOSE REDUCTION: This exam was performed according to the departmental dose-optimization program which includes automated exposure control, adjustment of the mA and/or kV according to patient size and/or use of iterative reconstruction technique. COMPARISON:  04/27/2024 FINDINGS: Brain: No acute intracranial abnormality. Specifically, no hemorrhage, hydrocephalus, mass lesion, acute infarction, or significant intracranial injury. There is atrophy and chronic small vessel disease changes. Vascular: No hyperdense vessel or unexpected calcification. Skull: No acute calvarial abnormality. Sinuses/Orbits: No acute findings Other: None IMPRESSION: Atrophy, chronic microvascular disease. No acute intracranial abnormality. Electronically Signed   By: Franky Crease M.D.   On: 05/16/2024 17:22   DG Hip Unilat With Pelvis 2-3 Views Right Result Date: 05/16/2024 CLINICAL DATA:  Unwitnessed fall EXAM: DG HIP (WITH OR WITHOUT  PELVIS) 2-3V RIGHT COMPARISON:  04/27/2024 FINDINGS: SI joints are non widened. Pubic symphysis and rami appear intact. Moderate left hip degenerative change. Acute comminuted and displaced right intertrochanteric fracture with varus angulation of distal fracture fragment. No femoral head dislocation IMPRESSION: Acute comminuted and displaced right intertrochanteric fracture. Electronically Signed   By: Luke Bun M.D.   On: 05/16/2024 15:33   DG Chest Port 1 View Result Date: 05/16/2024 CLINICAL DATA:  Unwitnessed fall EXAM: PORTABLE CHEST 1 VIEW COMPARISON:  04/27/2024 FINDINGS: Mild chronic bronchitic changes. No acute airspace disease or effusion. Mitral annular calcification. Stable cardiomediastinal silhouette with aortic atherosclerosis. No pneumothorax IMPRESSION: No active disease. Mild chronic bronchitic changes. Electronically Signed   By: Luke Bun M.D.   On: 05/16/2024 15:31        Scheduled Meds:  enoxaparin  (LOVENOX ) injection  40 mg Subcutaneous Q24H   levothyroxine   125 mcg Oral Q0600   lisinopril   20 mg Oral Daily   QUEtiapine   25 mg Oral QHS   Continuous Infusions:   LOS: 2 days    Time spent: 40 minutes    Renato Applebaum, MD [facility]

## 2024-05-18 NOTE — Progress Notes (Signed)
 Initial Nutrition Assessment  DOCUMENTATION CODES:   Not applicable  INTERVENTION:   Encourage PO intake Room service with assist Nursing to assist with feeding patient Ensure Plus High Protein po BID, each supplement provides 350 kcal and 20 grams of protein Mighty Shake TID with meals, each supplement provides 330 kcals and 9 grams of protein MVI with minerals daily  1,000 units Vitamin D  daily  100 mg Thiamine  x 5 days  NUTRITION DIAGNOSIS:   Increased nutrient needs related to acute illness, hip fracture as evidenced by estimated needs.   GOAL:   Patient will meet greater than or equal to 90% of their needs   MONITOR:   PO intake, Supplement acceptance  REASON FOR ASSESSMENT:   Consult Assessment of nutrition requirement/status, Hip fracture protocol  ASSESSMENT:  89 y.o Female with PMH of dementia, HTN, hypothyroidism who presented to ED after a fall and found to have right femur fracture.  7/6 - s/p ORIF, intramedullary nail by Dr.Marchwiany    RD working remotely, attempted to call pt's family with no response. History obtained through chart review.   Unable to determine how pt's intake was PTA. Weight appears stable in the last year. Pt is now POD 1 from ORIF on regular diet. Per RN pt needs to be reminded to swallow when eating. No issues with swallowing. Has not been eating much d/t being tired, only had a couple bites of eggs for breakfast. RD will add ONS to supplement intake.   Admit weight: 49.9 kg Current weight: 49.9 kg   Average Meal Intake: No meals recorded   Nutritionally Relevant Medications: Scheduled Meds:  cholecalciferol   1,000 Units Oral Daily   enoxaparin  (LOVENOX ) injection  40 mg Subcutaneous Q24H   feeding supplement  237 mL Oral BID BM   levothyroxine   125 mcg Oral Q0600   lisinopril   20 mg Oral Daily   multivitamin with minerals  1 tablet Oral Daily   QUEtiapine   25 mg Oral QHS   thiamine   100 mg Oral Daily   Labs  Reviewed: Calcium 8.8 CBG ranges from 117-155 mg/dL over the last 24 hours HgbA1c 5.4 (2023)   NUTRITION - FOCUSED PHYSICAL EXAM: -Deferred to follow up   Diet Order:   Diet Order             Diet regular Room service appropriate? Yes with Assist; Fluid consistency: Thin  Diet effective now                   EDUCATION NEEDS:   Not appropriate for education at this time  Skin:  Skin Assessment: Skin Integrity Issues: Skin Integrity Issues:: Incisions Incisions: Closed surgical incision R hip  Last BM:  PTA  Height:   Ht Readings from Last 1 Encounters:  05/17/24 5' 2.99 (1.6 m)    Weight:   Wt Readings from Last 1 Encounters:  05/17/24 49.9 kg     BMI:  Body mass index is 19.49 kg/m.  Estimated Nutritional Needs:   Kcal:  1400-1600 kcal  Protein:  70-90 gm  Fluid:  >1.4L/day   Olivia Kenning, RD Registered Dietitian  See Amion for more information

## 2024-05-18 NOTE — Telephone Encounter (Signed)
 Pt admitted

## 2024-05-18 NOTE — Progress Notes (Signed)
 Informed by RN that patient has a diet order but she is not eating or drinking and has not had much urine output all day (bladder scan at the end of dayshift showing 150 mL).  Start gentle IV fluid hydration and monitor glucose level.

## 2024-05-18 NOTE — [place] FL2 (Signed)
 [place]  MEDICAID FL2 LEVEL OF CARE FORM     IDENTIFICATION  Patient Name: [name] [name] Birthdate: 07-21-1927 Sex: female Admission Date (Current Location): 05/16/2024  [facility] and [place] Number:  Producer, television/film/video and Address:  [facility], [address], [place], [place] [address]      Provider Number: [id number]  Attending Physician Name and Address:  Raenelle Coria, MD  Relative Name and Phone Number:  Darice Breen, daughter - [phone]    Current Level of Care: Hospital Recommended Level of Care: Skilled Nursing Facility Prior Approval Number:    Date Approved/Denied:   PASRR Number:    Discharge Plan: SNF    Current Diagnoses: Patient Active Problem List   Diagnosis Date Noted   Closed displaced intertrochanteric fracture of right femur, initial encounter (HCC) 05/16/2024   Hallucinations 07/02/2023   Mild dementia with agitation (HCC) 07/02/2023   Cellulitis of right lower extremity 06/25/2022   Lower extremity edema 06/25/2022   Bilateral impacted cerumen 01/29/2022   Preventative health care 11/23/2021   Memory change 11/23/2021   Bruising 09/09/2020   Chronic right-sided low back pain without sciatica 06/03/2020   Acute mid back pain 06/03/2020   Weight decrease 08/04/2018   Anxiety 08/04/2018   Slow transit constipation 08/04/2018   Chest pain 09/05/2015   Hyperlipidemia 09/05/2015   CKD (chronic kidney disease), stage II-III 09/05/2015   Abnormal EKG 09/05/2015   HEMATURIA UNSPECIFIED 11/01/2009   BACK PAIN 11/01/2009   Hypothyroidism 07/11/2008   CERUMEN IMPACTION, BILATERAL 07/01/2008   DIZZINESS 07/01/2008   Essential hypertension 01/15/2008   KNEE PAIN, RIGHT 01/15/2008   Osteoporosis 01/15/2008   TONSILLECTOMY AND ADENOIDECTOMY, HX OF 01/15/2008    Orientation RESPIRATION BLADDER Height & Weight        Normal Incontinent Weight: 49.9 kg Height:  5' 2.99 (160 cm)  BEHAVIORAL SYMPTOMS/MOOD NEUROLOGICAL  BOWEL NUTRITION STATUS      Incontinent Diet (See discharge summary)  AMBULATORY STATUS COMMUNICATION OF NEEDS Skin   Extensive Assist Verbally Surgical wounds                       Personal Care Assistance Level of Assistance  Bathing, Feeding Bathing Assistance: Maximum assistance Feeding assistance: Maximum assistance       Functional Limitations Info  Sight, Hearing, Speech Sight Info: Impaired Hearing Info: Adequate Speech Info: Adequate    SPECIAL CARE FACTORS FREQUENCY  PT (By licensed PT), OT (By licensed OT)     PT Frequency: 5 x per week OT Frequency: 5 x per week            Contractures Contractures Info: Not present    Additional Factors Info  Code Status, Allergies, Psychotropic Code Status Info: DNR Allergies Info: NKDA Psychotropic Info: Haldol , Seroquel          Current Medications (05/18/2024):  This is the current hospital active medication list Current Facility-Administered Medications  Medication Dose Route Frequency Provider Last Rate Last Admin   acetaminophen  (TYLENOL ) tablet 1,000 mg  1,000 mg Oral Q6H Ghimire, Kuber, MD       bisacodyl  (DULCOLAX) EC tablet 5 mg  5 mg Oral Daily PRN Cockerham, Alicia M, PA-C       cholecalciferol  (VITAMIN D3) 25 MCG (1000 UNIT) tablet 1,000 Units  1,000 Units Oral Daily Ghimire, Coria, MD       enoxaparin  (LOVENOX ) injection 40 mg  40 mg Subcutaneous Q24H Cockerham, Alicia M, PA-C   40  mg at 05/18/24 1000   feeding supplement (ENSURE PLUS HIGH PROTEIN) liquid 237 mL  237 mL Oral BID BM Raenelle Coria, MD       haloperidol  lactate (HALDOL ) injection 2 mg  2 mg Intravenous Q6H PRN Ghimire, Kuber, MD   2 mg at 05/17/24 2045   labetalol  (NORMODYNE ) injection 5 mg  5 mg Intravenous Q4H PRN Cockerham, Alicia M, PA-C       levothyroxine  (SYNTHROID ) tablet 125 mcg  125 mcg Oral Q0600 Cockerham, Alicia M, PA-C   125 mcg at 05/18/24 9040   lisinopril  (ZESTRIL ) tablet 20 mg  20 mg Oral Daily Cockerham, Alicia M, PA-C    20 mg at 05/18/24 0959   multivitamin with minerals tablet 1 tablet  1 tablet Oral Daily Raenelle Coria, MD       ondansetron  (ZOFRAN ) injection 4 mg  4 mg Intravenous Q6H PRN Cockerham, Alicia M, PA-C   4 mg at 05/16/24 1435   oxyCODONE  (Oxy IR/ROXICODONE ) immediate release tablet 2.5 mg  2.5 mg Oral Q6H PRN Edna Toribio LABOR, MD       QUEtiapine  (SEROQUEL ) tablet 25 mg  25 mg Oral QHS Cockerham, Alicia M, PA-C   25 mg at 05/17/24 2044   senna-docusate (Senokot-S) tablet 1 tablet  1 tablet Oral QHS PRN Cockerham, Alicia M, PA-C       thiamine  (VITAMIN B1) tablet 100 mg  100 mg Oral Daily Ghimire, Kuber, MD         Discharge Medications: Please see discharge summary for a list of discharge medications.  Relevant Imaging Results:  Relevant Lab Results:   Additional Information SS# [id number]  Rosaline JONELLE Joe, RN

## 2024-05-18 NOTE — Telephone Encounter (Signed)
 Initial Comment Caller went to see the pt for Physical Therapy and when he got there, they told him pt fell, EMS came and took her to the ED. She is having pain in one of her legs and he wants to report this to the Dr. Shella No Disp. Time Titus Time) Disposition Final User 05/16/2024 1:21:15 PM Send to [facility] Paging Queue Laymon Holly 05/16/2024 1:25:33 PM Paged On Call back to Call Center - PC Forrestine Browning 05/16/2024 1:28:44 PM Page Completed Yes Forrestine Browning Final Disposition 05/16/2024 1:28:44 PM Page Completed Yes Forrestine Browning [facility] Phone DateTime Result/ Outcome Message Type Notes Mahlon Gift- MD [phone] 05/16/2024 1:25:33 PM Paged On Call Back to Call Center Doctor Paged This is Browning with Access Nurse. Please call us  back at [phone]. Thank you. Mahlon Gift- MD 05/16/2024 1:28:34 PM Spoke with On Call - General Message Result Relayed info to [facility]

## 2024-05-18 NOTE — Progress Notes (Signed)
 Transition of Care [facility]) - Inpatient Brief Assessment   Patient Details  Name: [name] [name] MRN: [id number] Date of Birth: 1927/02/23  [facility] CM/SW Contact:    [name] JONELLE Joe, RN Phone Number: 05/18/2024, 3:30 PM   Clinical Narrative: CM met with the patient and daughter, [name] [name] at the bedside.  Patient was not awake during my conversation with the daughter.  The patient's daughter states that patient has been very sleepy today and has not been easy to arouse today.  Patient normally stays at home with paid caregivers during the week days with daughters assisting when needed.  The daughter, [name] [name] requests SNF placement since she is unable to provide physical care at the home due to daughter's recent Left humerus surgery.  Daughter is currently in a Left arm sling.  DME at the home includes rolator, RW and commode chair.  Patient has dementia and recent falls at the home.  Patient's daughter's were agreeable to SNF placement and states that patient's deceased spouse was recently in [facility]. FL2 will be completed and patient will be faxed out for SNF bed offers.     Transition of Care Asessment: Insurance and Status: (P) Insurance coverage has been reviewed Patient has primary care physician: (P) Yes Home environment has been reviewed: (P) from home with paid caregivers Prior level of function:: (P) Rolator/RW Prior/Current Home Services: (P) No current home services Social Drivers of Health Review: (P) SDOH reviewed needs interventions Readmission risk has been reviewed: (P) Yes Transition of care needs: (P) transition of care needs identified, TOC will continue to follow

## 2024-05-19 ENCOUNTER — Encounter (HOSPITAL_COMMUNITY): Payer: Self-pay | Admitting: Internal Medicine

## 2024-05-19 DIAGNOSIS — S72141A Displaced intertrochanteric fracture of right femur, initial encounter for closed fracture: Secondary | ICD-10-CM | POA: Diagnosis not present

## 2024-05-19 MED ORDER — OXYCODONE HCL 5 MG PO TABS: 2.5000 mg | ORAL_TABLET | ORAL | 0 refills | Status: AC | PRN

## 2024-05-19 MED ORDER — ENOXAPARIN SODIUM 40 MG/0.4ML IJ SOSY: 40.0000 mg | PREFILLED_SYRINGE | INTRAMUSCULAR | 0 refills | Status: AC

## 2024-05-19 NOTE — Progress Notes (Signed)
 PROGRESS NOTE    [name] [name]  FMW:[id number] DOB: 1926-12-22 DOA: 05/16/2024 PCP: Antonio Cyndee Jamee JONELLE, DO    Brief Narrative:  89 year old with history of hypertension, hypothyroidism, dementia from home presented with fall.  She was found on the floor with telephone cord wrapped around her leg.  In the emergency room hemodynamically stable.  She was found to have right intertrochanteric fracture.  Other skeletal survey was negative. 7/6, underwent ORIF right hip.  Subjective: Patient seen and examined.  Her daughter was at the bedside.  Today she was able to be transferred to recliner.  Patient herself was complaining of hip being uncomfortable on the recliner and wanted to put her feet down.  Pleasant.  Confused.  More awake today.  Denies any significant pain.  Only taken Tylenol .  No evidence of agitation overnight.  Started on IV fluid due to low urine output.   Assessment & Plan:   Closed traumatic right hip fracture: ORIF, intramedullary nail 7/6, Dr. Edna Weightbearing as tolerated Adequate pain medications, opiate pathway with bowel regimen DVT prophylaxis, Lovenox  subcu Postop follow-up as per surgery.  Essential hypertension: Continue lisinopril .  Hypothyroidism: On Synthroid .  Dementia with delirium and agitation: Continue Seroquel .  Fall precautions.  Delirium precautions. Seroquel  25 mg at night. Haloperidol  2 mg IV every 6 hours for agitation. Mobilize with PT OT.  Refer to SNF for rehab.  Leukocytosis: Likely stress reaction.  No evidence of localizing infection.  DVT prophylaxis: enoxaparin  (LOVENOX ) injection 40 mg Start: 05/18/24 0900 SCDs Start: 05/16/24 1952   Code Status: DNR with limited intervention Family Communication: Patient's daughter at the bedside.  Pastor at the bedside. Disposition Plan: Status is: Inpatient Remains inpatient appropriate because: Medically stabilizing.  Needs skilled nursing facility.     Consultants:   Orthopedics  Procedures:  ORIF right hip  Antimicrobials:  Perioperative     Objective: Vitals:   05/18/24 1629 05/18/24 1936 05/19/24 0426 05/19/24 0749  BP: (!) 111/40 (!) 123/51 (!) 129/41 (!) 121/58  Pulse: 70 81 82 91  Resp: 14 18 18    Temp: 98.7 F (37.1 C) 99.2 F (37.3 C) 98.4 F (36.9 C) 97.6 F (36.4 C)  TempSrc: Axillary Oral    SpO2: 100% 96% 92% 97%  Weight:      Height:        Intake/Output Summary (Last 24 hours) at 05/19/2024 1117 Last data filed at 05/19/2024 0955 Gross per 24 hour  Intake 535.14 ml  Output --  Net 535.14 ml   Filed Weights   05/16/24 1413 05/17/24 0717  Weight: 49.9 kg 49.9 kg    Examination:  General: Slightly anxious mood otherwise looks comfortable.  Confused however pleasant to interaction. Cardiovascular: S1-S2 normal.  Regular rate rhythm. Respiratory: Bilateral clear.  No added sounds. Gastrointestinal: Soft.  Nontender.  Bowel sound present. Ext: No deformities.  Right lateral thigh incision clean and intact. Neuro: Alert and awake.  Oriented to herself.     Data Reviewed: I have personally reviewed following labs and imaging studies  CBC: Recent Labs  Lab 05/16/24 1440 05/17/24 1245  WBC 13.2* 13.8*  NEUTROABS 11.6*  --   HGB 14.0 11.9*  HCT 41.6 36.9  MCV 95.6 95.1  PLT 159 151   Basic Metabolic Panel: Recent Labs  Lab 05/16/24 1440 05/17/24 1245  NA 142 135  K 4.2 4.7  CL 106 101  CO2 26 23  GLUCOSE 117* 155*  BUN 27* 19  CREATININE 0.83 0.87  CALCIUM  9.3 8.8*   GFR: Estimated Creatinine Clearance: 30.5 mL/min (by C-G formula based on SCr of 0.87 mg/dL). Liver Function Tests: Recent Labs  Lab 05/16/24 1440  AST 21  ALT 18  ALKPHOS 75  BILITOT 0.8  PROT 6.6  ALBUMIN 3.5   No results for input(s): LIPASE, AMYLASE in the last 168 hours. No results for input(s): AMMONIA in the last 168 hours. Coagulation Profile: Recent Labs  Lab 05/16/24 1440  INR 0.9   Cardiac  Enzymes: Recent Labs  Lab 05/16/24 1440  CKTOTAL 71   BNP (last 3 results) No results for input(s): PROBNP in the last 8760 hours. HbA1C: No results for input(s): HGBA1C in the last 72 hours. CBG: No results for input(s): GLUCAP in the last 168 hours. Lipid Profile: No results for input(s): CHOL, HDL, LDLCALC, TRIG, CHOLHDL, LDLDIRECT in the last 72 hours. Thyroid  Function Tests: No results for input(s): TSH, T4TOTAL, FREET4, T3FREE, THYROIDAB in the last 72 hours. Anemia Panel: No results for input(s): VITAMINB12, FOLATE, FERRITIN, TIBC, IRON, RETICCTPCT in the last 72 hours. Sepsis Labs: No results for input(s): PROCALCITON, LATICACIDVEN in the last 168 hours.  Recent Results (from the past 240 hours)  Surgical pcr screen     Status: None   Collection Time: 05/17/24  5:53 AM   Specimen: Nasal Mucosa; Nasal Swab  Result Value Ref Range Status   MRSA, PCR NEGATIVE NEGATIVE Final   Staphylococcus aureus NEGATIVE NEGATIVE Final    Comment: (NOTE) The Xpert SA Assay (FDA approved for NASAL specimens in patients 75 years of age and older), is one component of a comprehensive surveillance program. It is not intended to diagnose infection nor to guide or monitor treatment. Performed at [facility], [address], [place], [place] [address]          Radiology Studies: No results found.       Scheduled Meds:  acetaminophen   1,000 mg Oral Q6H   cholecalciferol   1,000 Units Oral Daily   enoxaparin  (LOVENOX ) injection  40 mg Subcutaneous Q24H   feeding supplement  237 mL Oral BID BM   levothyroxine   125 mcg Oral Q0600   lisinopril   20 mg Oral Daily   multivitamin with minerals  1 tablet Oral Daily   QUEtiapine   25 mg Oral QHS   thiamine   100 mg Oral Daily   Continuous Infusions:  sodium chloride  Stopped (05/19/24 0824)     LOS: 3 days    Time spent: 40 minutes    Renato Applebaum, MD [facility]

## 2024-05-19 NOTE — Evaluation (Signed)
 Clinical/Bedside Swallow Evaluation Patient Details  Name: [name] [name] MRN: [id number] Date of Birth: 1927/05/01  Today's Date: 05/19/2024 Time: SLP Start Time (ACUTE ONLY): 0934 SLP Stop Time (ACUTE ONLY): 0943 SLP Time Calculation (min) (ACUTE ONLY): 9 min  Past Medical History:  Past Medical History:  Diagnosis Date   Arthritis    right thumb (09/05/2015)   Hyperlipidemia    Hypertension    Hypothyroidism    Osteoporosis    Past Surgical History:  Past Surgical History:  Procedure Laterality Date   CATARACT EXTRACTION W/ INTRAOCULAR LENS  IMPLANT, BILATERAL Bilateral 2010   CLOSED REDUCTION SHOULDER DISLOCATION Right    OVARIAN CYST SURGERY  1949   TONSILLECTOMY     HPI:  89yo female admitted 05/16/24 after a fall at home with right hip fx. ORIF 05/17/24. PMH: HTN, HLD, hypothyroidism, dementia, arthritis. CTHead - Atrophy, chronic microvascular disease. No acute intracranial abnormality.    Assessment / Plan / Recommendation  Clinical Impression  Pt seen for swallow assessment with daughter at bedside who denied prior dysphagia. Yesterday pt holding po's due to decreased alertness. Pt much more alert today, followed commands for oromotor exam not revealing deficits. Pt has a lower partial that daughter will bring back to hospital. Despite missing partial she was able to masticate solid texture timely without residue. There was mild residue with grits (which is typical) that she cleared with liquids. Multiple straw sips of thin did not reveal indications of decreased laryngeal protection. Daughter has been ordering softer foods for now. Recommend continue regular texture, thin liquids, pills with thin. Encourage self feeding. No further ST needed. SLP Visit Diagnosis: Dysphagia, unspecified (R13.10)    Aspiration Risk  Mild aspiration risk    Diet Recommendation Regular;Thin liquid    Liquid Administration via: Cup;Straw Medication Administration: Whole meds with  liquid Supervision: Staff to assist with self feeding Compensations: Slow rate;Small sips/bites Postural Changes: Seated upright at 90 degrees    Other  Recommendations Oral Care Recommendations: Oral care BID     Assistance Recommended at Discharge    Functional Status Assessment Patient has not had a recent decline in their functional status  Frequency and Duration            Prognosis        Swallow Study   General Date of Onset: 05/16/24 HPI: 89yo female admitted 05/16/24 after a fall at home with right hip fx. ORIF 05/17/24. PMH: HTN, HLD, hypothyroidism, dementia, arthritis. CTHead - Atrophy, chronic microvascular disease. No acute intracranial abnormality. Type of Study: Bedside Swallow Evaluation Previous Swallow Assessment: none Diet Prior to this Study: Regular;Thin liquids (Level 0) Temperature Spikes Noted: No Respiratory Status: Room air History of Recent Intubation: Yes Total duration of intubation (days):  (during surgery) Date extubated: 05/17/24 Behavior/Cognition: Alert;Cooperative;Pleasant mood Oral Cavity Assessment: Within Functional Limits Oral Care Completed by SLP: No Oral Cavity - Dentition: Other (Comment) (has lower bridge that dtr will bring back to hospital) Vision: Functional for self-feeding Self-Feeding Abilities: Needs assist Patient Positioning: Upright in chair Baseline Vocal Quality: Normal Volitional Cough: Strong Volitional Swallow: Able to elicit    Oral/Motor/Sensory Function Overall Oral Motor/Sensory Function: Within functional limits   Ice Chips Ice chips: Not tested   Thin Liquid Thin Liquid: Within functional limits Presentation: Straw    Nectar Thick Nectar Thick Liquid: Not tested   Honey Thick Honey Thick Liquid: Not tested   Puree Puree: Within functional limits (with grits)   Solid     Solid:  Within functional limits      Dustin Olam Bull 05/19/2024,10:04 AM

## 2024-05-19 NOTE — TOC Progression Note (Signed)
 Transition of Care [facility]) - Progression Note    Patient Details  Name: [name] [name] MRN: [id number] Date of Birth: 1927-07-26  [facility] CM/SW Contact  Almarie CHRISTELLA [place], [place] Phone Number: 05/19/2024, 4:09 PM  Clinical Narrative:   CSW met with patient's daughters, Channing and Darice, at bedside to provide bed offers and discuss SNF options. Lengthy discussion with daughters to discuss options available and answer questions, daughters appreciative. Daughters interested in [facility] or [facility], which are both pending. CSW contacted [facility], they are full. [facility] is able to offer a bed. CSW contacted Darice to provide update that [facility] could offer, and Darice requested time to discuss with Channing, as that would be far for her to drive. Darice called CSW back and asked about [facility]. CSW asked [facility] to review referral. CSW to follow.    Expected Discharge Plan: Skilled Nursing Facility Barriers to Discharge: English as a second language teacher, Continued Medical Work up  Expected Discharge Plan and Services                                               Social Determinants of Health (SDOH) Interventions SDOH Screenings   Food Insecurity: No Food Insecurity (05/16/2024)  Housing: Low Risk  (05/16/2024)  Transportation Needs: No Transportation Needs (05/16/2024)  Utilities: Not At Risk (05/16/2024)  Alcohol Screen: Low Risk  (03/16/2022)  Depression (PHQ2-9): Medium Risk (01/01/2023)  Financial Resource Strain: Low Risk  (03/16/2022)  Physical Activity: Insufficiently Active (03/16/2022)  Social Connections: Socially Isolated (05/16/2024)  Stress: Stress Concern Present (03/16/2022)  Tobacco Use: Low Risk  (05/19/2024)    Readmission Risk Interventions     No data to display

## 2024-05-19 NOTE — Progress Notes (Signed)
    2 Days Post-Op Procedure(s) (LRB): FIXATION, FRACTURE, INTERTROCHANTERIC, WITH INTRAMEDULLARY ROD (Right)  Subjective:  Patient seen this morning. Appears sleepy. Wanted to keep sleeping rather than participate with exam.  Still needs substantial assistance with PT.  Denies significant pain.  No overnight events or other complaints.  Objective:   VITALS:   Vitals:   05/18/24 0720 05/18/24 1629 05/18/24 1936 05/19/24 0426  BP: (!) 143/96 (!) 111/40 (!) 123/51 (!) 129/41  Pulse: 95 70 81 82  Resp:  14 18 18   Temp: 97.8 F (36.6 C) 98.7 F (37.1 C) 99.2 F (37.3 C) 98.4 F (36.9 C)  TempSrc:  Axillary Oral   SpO2: 98% 100% 96% 92%  Weight:      Height:        Resting in bed in NAD Intact pulses distally Incision: dressing C/D/I Compartment soft Patient not participating with motorsensory exam today though does demonstrate spontaneous and directed wiggling of toes.  Lab Results  Component Value Date   WBC 13.8 (H) 05/17/2024   HGB 11.9 (L) 05/17/2024   HCT 36.9 05/17/2024   MCV 95.1 05/17/2024   PLT 151 05/17/2024   BMET    Component Value Date/Time   NA 135 05/17/2024 1245   K 4.7 05/17/2024 1245   CL 101 05/17/2024 1245   CO2 23 05/17/2024 1245   GLUCOSE 155 (H) 05/17/2024 1245   BUN 19 05/17/2024 1245   CREATININE 0.87 05/17/2024 1245   CREATININE 0.82 09/09/2020 1119   CALCIUM 8.8 (L) 05/17/2024 1245   GFRNONAA >60 05/17/2024 1245      Xray: hip nail in good alignment no adverse features  Assessment/Plan: 2 Days Post-Op   Principal Problem:   Closed displaced intertrochanteric fracture of right femur, initial encounter (HCC) Active Problems:   Hypothyroidism   Essential hypertension   Mild dementia with agitation (HCC)  S/p R intertroch fx ORIF 05/17/24  POD 1: patient very drowsy. Dc'd morphine  and hydrocodone  5-10 and replaced with oxy 2.5. Patient also on scheduled tylenol . Could consider toradol if alternative pain med required. Seroquel   and haldol  also ordered per med team for night time agitation.  POD2: Patient still somewhat sleepy.  Wiggles toes to stimulus and command on exam but reports just wants to sleep.  Bandages generally intact, can change PRN.  Anticipate SNF   Post op recs: WB: WBAT RLE Abx: ancef  x23 hours post op Imaging: PACU xrays Dressing: keep intact until follow up, change PRN if soiled or saturated. DVT prophylaxis: lovenox  starting POD1 x4 weeks Follow up: 2 weeks after surgery for a wound check with Dr[facility] Edna at [facility].  Address: [address], [place], [place] [address]  Office Phone: [phone]    Bernarda CHRISTELLA Mclean 05/19/2024, 7:04 AM    Contact information:   Weekdays 7am-5pm epic message Dr. Edna, or call office for patient follow up: [phone] After hours and holidays please check [email or web address] for group call information for [facility]

## 2024-05-19 NOTE — Progress Notes (Signed)
 Physical Therapy Treatment Patient Details Name: [name] [name] MRN: [id number] DOB: November 13, 1926 Today's Date: 05/19/2024   History of Present Illness 89 y.o. female presented to the ED 7/5 for evaluation after a fall at home. S/P 7/6 R hip ORIF PMH: HTN, hypothyroidism, dementia    PT Comments  Pt continues to be limited in participation in therapy by decreased level of arousal. This is compounded by the fact that she prefers to not open her eyes and can not see well due to macular degeneration and her hearing is poor so visual and verbal cues are difficult for her to follow. Ultimately, pt requires maxAx2 for bed mobility, max A for seated balance, maxAx2 for standing in the [place]. Pt is making progress though which is encouraging. Given level of assist needed patient will benefit from continued inpatient follow up therapy, <3 hours/day. PT will continue to follow acutely.     If plan is discharge home, recommend the following: A lot of help with walking and/or transfers;A lot of help with bathing/dressing/bathroom;Assistance with cooking/housework;Direct supervision/assist for medications management;Direct supervision/assist for financial management;Assist for transportation;Help with stairs or ramp for entrance;Supervision due to cognitive status   Can travel by private vehicle      No   Equipment Recommendations  Rolling walker (2 wheels);BSC/3in1    Recommendations for Other Services OT consult;Speech consult     Precautions / Restrictions Precautions Precautions: Fall Recall of Precautions/Restrictions: Impaired Restrictions Weight Bearing Restrictions Per Provider Order: Yes RLE Weight Bearing Per Provider Order: Weight bearing as tolerated     Mobility  Bed Mobility Overal bed mobility: Needs Assistance Bed Mobility: Supine to Sit     Supine to sit: Max assist, +2 for physical assistance, +2 for safety/equipment, HOB elevated, Used rails     General bed mobility  comments: pt requires max A +2 to come to EoB Pt did participate by holding onto bedrail. and attempting to assist- ultimatly faciliated with bed pad to assist for hip rotation to EOB and assist for trunk elevation - initially very strong posterior lean    Transfers Overall transfer level: Needs assistance Equipment used: Ambulation equipment used Transfers: Sit to/from Stand Sit to Stand: Max assist, +2 physical assistance, +2 safety/equipment, Via lift equipment, From elevated surface           General transfer comment: R posterior lean initially with cues to correct, assisted by pulling up on stedy and use of bed pad to assist with hip elevation off the bed and chair for boost. ultimately requires max A +2. Transfer via Lift Equipment: Stedy         Balance Overall balance assessment: History of Falls, Needs assistance Sitting-balance support: Feet supported, Bilateral upper extremity supported Sitting balance-Leahy Scale: Poor Sitting balance - Comments: initially heavy right posterior lean, able to correct with heavy cues - fatigues and starts to lean again Postural control: Posterior lean, Right lateral lean Standing balance support: Bilateral upper extremity supported, Reliant on assistive device for balance Standing balance-Leahy Scale: Poor Standing balance comment: dependent on therapy team and stedy                            Communication Communication Communication: Other (comment) Factors Affecting Communication: Other (comment);Reduced clarity of speech (level of arousal)  Cognition Arousal: Obtunded Behavior During Therapy: Flat affect  Following commands: Impaired Following commands impaired: Follows one step commands with increased time, Follows one step commands inconsistently (does not follow commands)    Cueing Cueing Techniques: Verbal cues, Gestural cues, Tactile cues, Visual cues     General  Comments General comments (skin integrity, edema, etc.): pt continues to be limited in participation due to decreased level of arousal however much better than yesterday. Pt daughter in room throughout session and helpful with encouragement      Pertinent Vitals/Pain Pain Assessment Pain Assessment: Faces Faces Pain Scale: Hurts a little bit Pain Location: R hip with flattening the bed/movement Pain Descriptors / Indicators: Grimacing, Guarding Pain Intervention(s): Limited activity within patient's tolerance, Monitored during session, Repositioned    Home Living Family/patient expects to be discharged to:: Private residence Living Arrangements: Alone                          PT Goals (current goals can now be found in the care plan section) Acute Rehab PT Goals PT Goal Formulation: With family Time For Goal Achievement: 06/01/24 Potential to Achieve Goals: Fair    Frequency    Min 2X/week           Co-evaluation PT/OT/SLP Co-Evaluation/Treatment: Yes Reason for Co-Treatment: Necessary to address cognition/behavior during functional activity;For patient/therapist safety;To address functional/ADL transfers PT goals addressed during session: Mobility/safety with mobility;Balance;Proper use of DME;Strengthening/ROM OT goals addressed during session: ADL's and self-care;Strengthening/ROM;Proper use of Adaptive equipment and DME      AM-PAC PT 6 Clicks Mobility   Outcome Measure  Help needed turning from your back to your side while in a flat bed without using bedrails?: Total Help needed moving from lying on your back to sitting on the side of a flat bed without using bedrails?: Total Help needed moving to and from a bed to a chair (including a wheelchair)?: Total Help needed standing up from a chair using your arms (e.g., wheelchair or bedside chair)?: Total Help needed to walk in [name] room?: Total Help needed climbing 3-5 steps with a railing? : Total 6  Click Score: 6    End of Session   Activity Tolerance: Treatment limited secondary to medical complications (Comment) (level of arousal) Patient left: in bed;with call bell/phone within reach;with bed alarm set;with family/visitor present Nurse Communication: Mobility status PT Visit Diagnosis: Muscle weakness (generalized) (M62.81);Pain;Other abnormalities of gait and mobility (R26.89)     Time: 9153-9079 PT Time Calculation (min) (ACUTE ONLY): 34 min  Charges:    $Therapeutic Activity: 8-22 mins PT General Charges $$ ACUTE PT VISIT: 1 Visit                     Jacson Rapaport B. Fleeta Lapidus PT, DPT Acute Rehabilitation Services Please use secure chat or  Call Office [phone]    [name] [name] 05/19/2024, 4:54 PM

## 2024-05-19 NOTE — Evaluation (Signed)
 Occupational Therapy Evaluation Patient Details Name: [name] [name] MRN: [id number] DOB: 03/27/1927 Today's Date: 05/19/2024   History of Present Illness   89 y.o. female presented to the ED 7/5 for evaluation after a fall at home. S/P 7/6 R hip ORIF PMH: HTN, hypothyroidism, dementia, macular degeneration     Clinical Impressions Pt is typically at home with caregivers during the day (they help her get out of bed, clean and cook for her). She typically gets around inside the home with a rollator and outside the home with a RW. She does her own bathing/dressing/grooming. She has limited vision due to macular degeneration and frequently keeps her eyes closed while sitting at baseline. Today she is more alert than previous PT eval, but required max cues to attend to tasks and open eyes throughout session. Max A +2 for bed mobility and to come to partial stand with use of stedy. Pt is presently max A for UB ADL due to limited alertness and total A for LB ADL. She was able to participate in grooming tasks while seated EOB and progressed from max A for seated balance (with right posterior lean initially) to min guard - but fatigued quickly and needed at least min to mod A. Pt will benefit from skilled OT in the acute setting as well as afterwards at rehab of <3 hour daily to maximize safety and independence in ADL and functional transfers.      If plan is discharge home, recommend the following:   Two people to help with walking and/or transfers;A lot of help with bathing/dressing/bathroom;Assistance with feeding;Assistance with cooking/housework;Direct supervision/assist for medications management;Direct supervision/assist for financial management;Assist for transportation;Help with stairs or ramp for entrance;Supervision due to cognitive status     Functional Status Assessment   Patient has had a recent decline in their functional status and demonstrates the ability to make significant  improvements in function in a reasonable and predictable amount of time.     Equipment Recommendations   Other (comment) (defer to next venue of care)     Recommendations for Other Services   PT consult;Speech consult     Precautions/Restrictions   Precautions Precautions: Fall Recall of Precautions/Restrictions: Impaired Restrictions Weight Bearing Restrictions Per Provider Order: Yes RLE Weight Bearing Per Provider Order: Weight bearing as tolerated     Mobility Bed Mobility Overal bed mobility: Needs Assistance Bed Mobility: Supine to Sit     Supine to sit: Max assist, +2 for physical assistance, +2 for safety/equipment, HOB elevated, Used rails     General bed mobility comments: pt requires max A +2 to come to EoB Pt did participate by holding onto bedrail. and attempting to assist- ultimatly faciliated with bed pad to assist for hip rotation to EOB and assist for trunk elevation - initially very strong posterior lean    Transfers Overall transfer level: Needs assistance Equipment used: Ambulation equipment used Transfers: Sit to/from Stand Sit to Stand: Max assist, +2 physical assistance, +2 safety/equipment, Via lift equipment, From elevated surface           General transfer comment: R posterior lean initially with cues to correct, assisted by pulling up on stedy and use of bed pad to assist with hip elevation off the bed and chair for boost. ultimately requires max A +2. Transfer via Lift Equipment: Stedy    Balance Overall balance assessment: History of Falls, Needs assistance Sitting-balance support: Feet supported, Bilateral upper extremity supported Sitting balance-Leahy Scale: Poor Sitting balance - Comments: initially heavy  right posterior lean, able to correct with heavy cues - fatigues and starts to lean again Postural control: Posterior lean, Right lateral lean Standing balance support: Bilateral upper extremity supported, Reliant on assistive  device for balance Standing balance-Leahy Scale: Poor Standing balance comment: dependent on therapy team and stedy                           ADL either performed or assessed with clinical judgement   ADL Overall ADL's : Needs assistance/impaired Eating/Feeding: Maximal assistance;Sitting   Grooming: Brushing hair;Moderate assistance;Sitting Grooming Details (indicate cue type and reason): sitting EOB Upper Body Bathing: Maximal assistance   Lower Body Bathing: Total assistance   Upper Body Dressing : Maximal assistance   Lower Body Dressing: Total assistance   Toilet Transfer: Maximal assistance;+2 for physical assistance;+2 for safety/equipment;BSC/3in1 (STEDY)   Toileting- Clothing Manipulation and Hygiene: Total assistance       Functional mobility during ADLs: Maximal assistance;+2 for physical assistance;+2 for safety/equipment (STEDY) General ADL Comments: decreased arousal, decreased ROM     Vision Baseline Vision/History: 6 Macular Degeneration Ability to See in Adequate Light: 2 Moderately impaired Patient Visual Report: Other (comment) Additional Comments: Pt frequently closes eyes throughout session - despite cues. per daughter this is not an uncommon behavior     Perception         Praxis         Pertinent Vitals/Pain Pain Assessment Pain Assessment: Faces Faces Pain Scale: Hurts a little bit Pain Location: R hip with flattening the bed/movement Pain Descriptors / Indicators: Grimacing, Guarding Pain Intervention(s): Monitored during session, Repositioned     Extremity/Trunk Assessment Upper Extremity Assessment Upper Extremity Assessment: Generalized weakness;Difficult to assess due to impaired cognition   Lower Extremity Assessment Lower Extremity Assessment: Defer to PT evaluation RLE Deficits / Details: at end of session Pt RLE in recliner in extension and pillow under ankle to   Cervical / Trunk Assessment Cervical / Trunk  Assessment: Kyphotic   Communication Communication Communication: Other (comment) Factors Affecting Communication: Other (comment);Reduced clarity of speech (level of arousal)   Cognition Arousal: Obtunded Behavior During Therapy: Flat affect Cognition: History of cognitive impairments, Difficult to assess Difficult to assess due to: Hard of hearing/deaf, Level of arousal           OT - Cognition Comments: Pt more alert than at initial PT eval, however constantly falling back asleep during session. Pt was able to answer some questions approiately. Daughter present to confirm cognitive status                 Following commands: Impaired Following commands impaired: Follows one step commands with increased time, Follows one step commands inconsistently (does not follow commands)     Cueing  General Comments   Cueing Techniques: Verbal cues;Gestural cues;Tactile cues;Visual cues      Exercises     Shoulder Instructions      Home Living Family/patient expects to be discharged to:: Private residence Living Arrangements: Alone Available Help at Discharge: Family;Available 24 hours/day Type of Home: House Home Access: Stairs to enter [employer] of Steps: 2 Entrance Stairs-Rails: Left Home Layout: One level     Bathroom Shower/Tub: Sponge bathes at baseline   Bathroom Toilet: Standard Bathroom Accessibility: Yes   Home Equipment: Toilet riser;Grab bars - toilet;Grab bars - tub/shower;Rollator (4 wheels);Standard Walker   Additional Comments: has caregivers and family throughout the day caregiver puts her in bed and caregiver there  to wake her up      Prior Functioning/Environment Prior Level of Function : Needs assist  Cognitive Assist : ADLs (cognitive)           Mobility Comments: uses Rollator in home, and standard walker in driveway ADLs Comments: does her own bathing and dressing, caregiver does cooking, laundry    OT Problem List:  Decreased range of motion;Decreased activity tolerance;Decreased strength;Impaired balance (sitting and/or standing);Decreased cognition;Decreased safety awareness;Pain   OT Treatment/Interventions: Self-care/ADL training;DME and/or AE instruction;Therapeutic exercise;Therapeutic activities;Patient/family education;Balance training      OT Goals(Current goals can be found in the care plan section)   Acute Rehab OT Goals Patient Stated Goal: maximize mobility/transfers OT Goal Formulation: With family Time For Goal Achievement: 06/02/24 Potential to Achieve Goals: Good ADL Goals Pt Will Perform Eating: sitting;with set-up Pt Will Perform Grooming: with supervision;sitting Pt Will Perform Upper Body Dressing: with min assist;sitting Pt Will Perform Lower Body Dressing: with max assist;sitting/lateral leans Pt Will Transfer to Toilet: with max assist;stand pivot transfer;bedside commode Pt Will Perform Toileting - Clothing Manipulation and hygiene: with mod assist;sitting/lateral leans   OT Frequency:  Min 2X/week    Co-evaluation PT/OT/SLP Co-Evaluation/Treatment: Yes Reason for Co-Treatment: Necessary to address cognition/behavior during functional activity;For patient/therapist safety;To address functional/ADL transfers PT goals addressed during session: Mobility/safety with mobility;Balance;Proper use of DME;Strengthening/ROM OT goals addressed during session: ADL's and self-care;Strengthening/ROM;Proper use of Adaptive equipment and DME      AM-PAC OT 6 Clicks Daily Activity     Outcome Measure Help from another person eating meals?: A Lot Help from another person taking care of personal grooming?: A Lot Help from another person toileting, which includes using toliet, bedpan, or urinal?: A Lot Help from another person bathing (including washing, rinsing, drying)?: A Lot Help from another person to put on and taking off regular upper body clothing?: A Lot Help from another  person to put on and taking off regular lower body clothing?: Total 6 Click Score: 11   End of Session Equipment Utilized During Treatment: Gait belt Laurent) Nurse Communication: Mobility status;Precautions;Need for lift equipment;Weight bearing status  Activity Tolerance: Patient tolerated treatment well Patient left: in chair;with call bell/phone within reach;with chair alarm set;with family/visitor present  OT Visit Diagnosis: Unsteadiness on feet (R26.81);Other abnormalities of gait and mobility (R26.89);Muscle weakness (generalized) (M62.81);Pain Pain - Right/Left: Right Pain - part of body: Leg                Time: 9154-9077 OT Time Calculation (min): 37 min Charges:  OT General Charges $OT Visit: 1 Visit OT Evaluation $OT Eval Moderate Complexity: 1 Mod  Leita DEL OTR/L Acute Rehabilitation Services Office: [phone]  Leita PARAS [facility] 05/19/2024, 9:49 AM

## 2024-05-19 NOTE — Anesthesia Postprocedure Evaluation (Signed)
 Anesthesia Post Note  Patient: [name] [name]  Procedure(s) Performed: FIXATION, FRACTURE, INTERTROCHANTERIC, WITH INTRAMEDULLARY ROD (Right)     Patient location during evaluation: PACU Anesthesia Type: General Level of consciousness: awake and alert Pain management: pain level controlled Vital Signs Assessment: post-procedure vital signs reviewed and stable Respiratory status: spontaneous breathing, nonlabored ventilation, respiratory function stable and patient connected to nasal cannula oxygen Cardiovascular status: blood pressure returned to baseline and stable Postop Assessment: no apparent nausea or vomiting Anesthetic complications: no   No notable events documented.  Last Vitals:  Vitals:   05/19/24 0426 05/19/24 0749  BP: (!) 129/41 (!) 121/58  Pulse: 82 91  Resp: 18   Temp: 36.9 C 36.4 C  SpO2: 92% 97%    Last Pain:  Vitals:   05/18/24 1936  TempSrc: Oral  PainSc: Asleep                 Mirakle Tomlin S

## 2024-05-19 NOTE — Care Management Important Message (Signed)
 Important Message  Patient Details  Name: [name] [name] MRN: [id number] Date of Birth: May 03, 1927   Important Message Given:  Yes - Medicare IM     Claretta Deed 05/19/2024, 3:43 PM

## 2024-05-19 NOTE — Plan of Care (Signed)

## 2024-05-19 NOTE — Progress Notes (Signed)
 Transition of Care [facility]) - CAGE-AID Screening   Patient Details  Name: [name] [name] MRN: [id number] Date of Birth: 1927-09-30   MARINDA LIONEL Sora, RN Phone Number: 05/19/2024, 5:19 AM   Clinical Narrative:  Unable to participate, disoriented x 4  CAGE-AID Screening: Substance Abuse Screening unable to be completed due to: : Patient unable to participate             Substance Abuse Education Offered: No

## 2024-05-20 DIAGNOSIS — S72141A Displaced intertrochanteric fracture of right femur, initial encounter for closed fracture: Secondary | ICD-10-CM | POA: Diagnosis not present

## 2024-05-20 LAB — CBC
HCT: 26.4 % — ABNORMAL LOW (ref 36.0–46.0)
Hemoglobin: 8.7 g/dL — ABNORMAL LOW (ref 12.0–15.0)
MCH: 31.1 pg (ref 26.0–34.0)
MCHC: 33 g/dL (ref 30.0–36.0)
MCV: 94.3 fL (ref 80.0–100.0)
Platelets: 147 K/uL — ABNORMAL LOW (ref 150–400)
RBC: 2.8 MIL/uL — ABNORMAL LOW (ref 3.87–5.11)
RDW: 13.2 % (ref 11.5–15.5)
WBC: 9.3 K/uL (ref 4.0–10.5)
nRBC: 0 % (ref 0.0–0.2)

## 2024-05-20 LAB — BASIC METABOLIC PANEL WITH GFR
Anion gap: 8 (ref 5–15)
BUN: 50 mg/dL — ABNORMAL HIGH (ref 8–23)
CO2: 23 mmol/L (ref 22–32)
Calcium: 8.4 mg/dL — ABNORMAL LOW (ref 8.9–10.3)
Chloride: 105 mmol/L (ref 98–111)
Creatinine, Ser: 0.92 mg/dL (ref 0.44–1.00)
GFR, Estimated: 57 mL/min — ABNORMAL LOW (ref >60)
Glucose, Bld: 123 mg/dL — ABNORMAL HIGH (ref 70–99)
Potassium: 3.9 mmol/L (ref 3.5–5.1)
Sodium: 136 mmol/L (ref 135–145)

## 2024-05-20 LAB — GLUCOSE, CAPILLARY: Glucose-Capillary: 119 mg/dL — ABNORMAL HIGH (ref 70–99)

## 2024-05-20 MED ORDER — CHLORHEXIDINE GLUCONATE CLOTH 2 % EX PADS
6.0000 | MEDICATED_PAD | Freq: Every day | CUTANEOUS | Status: DC
  Administered 2024-05-20 – 2024-05-21 (×2): 6 via TOPICAL

## 2024-05-20 MED ORDER — ENOXAPARIN SODIUM 30 MG/0.3ML IJ SOSY
30.0000 mg | PREFILLED_SYRINGE | INTRAMUSCULAR | Status: DC
  Administered 2024-05-21 – 2024-05-22 (×2): 30 mg via SUBCUTANEOUS
  Filled 2024-05-20 (×2): qty 0.3

## 2024-05-20 NOTE — TOC Progression Note (Signed)
 Transition of Care [facility]) - Progression Note    Patient Details  Name: [name] [name] MRN: [id number] Date of Birth: 1927-07-03  Transition of Care [facility]) CM/SW Contact  [name] [name], [place] Phone Number: 05/20/2024, 3:56 PM  Clinical Narrative:   CSW spoke with daughter, [name] [name], who again asked about [facility]. CSW contacted [facility], they are full. CSW updated [name] [name], and sister was going to look at SNF options and will call CSW back with choice. Family chose [facility]. CSW confirmed with [facility] that they will have a bed available. Awaiting medical stability to initiate insurance authorization request. CSW to follow.    Expected Discharge Plan: Skilled Nursing Facility Barriers to Discharge: English as a second language teacher, Continued Medical Work up  Expected Discharge Plan and Services                                               Social Determinants of Health (SDOH) Interventions SDOH Screenings   Food Insecurity: No Food Insecurity (05/16/2024)  Housing: Low Risk  (05/16/2024)  Transportation Needs: No Transportation Needs (05/16/2024)  Utilities: Not At Risk (05/16/2024)  Alcohol Screen: Low Risk  (03/16/2022)  Depression (PHQ2-9): Medium Risk (01/01/2023)  Financial Resource Strain: Low Risk  (03/16/2022)  Physical Activity: Insufficiently Active (03/16/2022)  Social Connections: Socially Isolated (05/16/2024)  Stress: Stress Concern Present (03/16/2022)  Tobacco Use: Low Risk  (05/19/2024)    Readmission Risk Interventions     No data to display

## 2024-05-20 NOTE — Progress Notes (Signed)
 PROGRESS NOTE    [name] [name]  FMW:[id number] DOB: 1927-07-09 DOA: 05/16/2024 PCP: Antonio Cyndee Jamee JONELLE, DO    Brief Narrative:  89 year old with history of hypertension, hypothyroidism, dementia from home presented with fall.  She was found on the floor with telephone cord wrapped around her leg.  In the emergency room hemodynamically stable.  She was found to have right intertrochanteric fracture.  Other skeletal survey was negative. 7/6, underwent ORIF right hip.  Subjective: Patient seen and examined.  Early morning hours, she was sleepy and difficult to arouse.  Went examined patient with her daughter at the bedside.  Patient is still sleepy, however her daughter was able to feed her some breakfast.  She will fall back to sleep.  Not received any sedative overnight.  On Seroquel  25 mg.  Will decrease dose.   Assessment & Plan:   Closed traumatic right hip fracture: ORIF, intramedullary nail 7/6, Dr. Edna Weightbearing as tolerated Adequate pain medications, opiate pathway with bowel regimen DVT prophylaxis, Lovenox  subcu Postop follow-up as per surgery.  Essential hypertension: Continue lisinopril .  Hypothyroidism: On Synthroid .  Dementia with delirium and agitation: Excessively sleepy with Seroquel  25 at night.  Decrease dose to half. Fall precautions.  Delirium precautions. Mobilize with PT OT.  Refer to SNF for rehab.  Leukocytosis: Likely stress reaction.  No evidence of localizing infection.  Urinary retention: Due to sleepiness and hip fracture.  Straight cath as needed.  DVT prophylaxis: enoxaparin  (LOVENOX ) injection 40 mg Start: 05/18/24 0900 SCDs Start: 05/16/24 1952   Code Status: DNR with limited intervention Family Communication: Patient's daughter at the bedside.  Disposition Plan: Status is: Inpatient Remains inpatient appropriate because: Medically stabilizing.  Needs skilled nursing facility.     Consultants:  Orthopedics  Procedures:   ORIF right hip  Antimicrobials:  Perioperative     Objective: Vitals:   05/19/24 1942 05/20/24 0001 05/20/24 0821 05/20/24 0935  BP: (!) 83/68 (!) 117/43 (!) 129/48 (!) 129/52  Pulse: (!) 120 94 76   Resp:  17    Temp: 98.2 F (36.8 C) 99.3 F (37.4 C) 97.6 F (36.4 C)   TempSrc:      SpO2: 97% 99% 99%   Weight:      Height:        Intake/Output Summary (Last 24 hours) at 05/20/2024 1159 Last data filed at 05/19/2024 1500 Gross per 24 hour  Intake --  Output 600 ml  Net -600 ml   Filed Weights   05/16/24 1413 05/17/24 0717  Weight: 49.9 kg 49.9 kg    Examination:  General: Mostly sleepy.  Gets anxious on stimuli. Cardiovascular: S1-S2 normal.  Regular rate rhythm. Respiratory: Bilateral clear.  No added sounds. Gastrointestinal: Soft.  Nontender.  Bowel sound present. Ext: No deformities.  Right lateral thigh incision clean and intact. Neuro: Alert and awake.  Oriented to herself.     Data Reviewed: I have personally reviewed following labs and imaging studies  CBC: Recent Labs  Lab 05/16/24 1440 05/17/24 1245 05/20/24 0816  WBC 13.2* 13.8* 9.3  NEUTROABS 11.6*  --   --   HGB 14.0 11.9* 8.7*  HCT 41.6 36.9 26.4*  MCV 95.6 95.1 94.3  PLT 159 151 147*   Basic Metabolic Panel: Recent Labs  Lab 05/16/24 1440 05/17/24 1245 05/20/24 0816  NA 142 135 136  K 4.2 4.7 3.9  CL 106 101 105  CO2 26 23 23   GLUCOSE 117* 155* 123*  BUN 27* 19 50*  CREATININE 0.83 0.87 0.92  CALCIUM 9.3 8.8* 8.4*   GFR: Estimated Creatinine Clearance: 28.8 mL/min (by C-G formula based on SCr of 0.92 mg/dL). Liver Function Tests: Recent Labs  Lab 05/16/24 1440  AST 21  ALT 18  ALKPHOS 75  BILITOT 0.8  PROT 6.6  ALBUMIN 3.5   No results for input(s): LIPASE, AMYLASE in the last 168 hours. No results for input(s): AMMONIA in the last 168 hours. Coagulation Profile: Recent Labs  Lab 05/16/24 1440  INR 0.9   Cardiac Enzymes: Recent Labs  Lab  05/16/24 1440  CKTOTAL 71   BNP (last 3 results) No results for input(s): PROBNP in the last 8760 hours. HbA1C: No results for input(s): HGBA1C in the last 72 hours. CBG: No results for input(s): GLUCAP in the last 168 hours. Lipid Profile: No results for input(s): CHOL, HDL, LDLCALC, TRIG, CHOLHDL, LDLDIRECT in the last 72 hours. Thyroid  Function Tests: No results for input(s): TSH, T4TOTAL, FREET4, T3FREE, THYROIDAB in the last 72 hours. Anemia Panel: No results for input(s): VITAMINB12, FOLATE, FERRITIN, TIBC, IRON, RETICCTPCT in the last 72 hours. Sepsis Labs: No results for input(s): PROCALCITON, LATICACIDVEN in the last 168 hours.  Recent Results (from the past 240 hours)  Surgical pcr screen     Status: None   Collection Time: 05/17/24  5:53 AM   Specimen: Nasal Mucosa; Nasal Swab  Result Value Ref Range Status   MRSA, PCR NEGATIVE NEGATIVE Final   Staphylococcus aureus NEGATIVE NEGATIVE Final    Comment: (NOTE) The Xpert SA Assay (FDA approved for NASAL specimens in patients 68 years of age and older), is one component of a comprehensive surveillance program. It is not intended to diagnose infection nor to guide or monitor treatment. Performed at [facility], [address], [place], [place] [address]          Radiology Studies: No results found.       Scheduled Meds:  acetaminophen   1,000 mg Oral Q6H   cholecalciferol   1,000 Units Oral Daily   enoxaparin  (LOVENOX ) injection  40 mg Subcutaneous Q24H   feeding supplement  237 mL Oral BID BM   levothyroxine   125 mcg Oral Q0600   lisinopril   20 mg Oral Daily   multivitamin with minerals  1 tablet Oral Daily   QUEtiapine   25 mg Oral QHS   thiamine   100 mg Oral Daily   Continuous Infusions:     LOS: 4 days    Time spent: 40 minutes    Renato Applebaum, MD [facility]

## 2024-05-20 NOTE — Progress Notes (Signed)
     Subjective: Ms. Brizuela remains sleepy this morning.SABRA Physical therapy was encouraged by her improvement in participation with PT yesterday.   Objective:   VITALS:   Vitals:   05/19/24 1141 05/19/24 1152 05/19/24 1942 05/20/24 0001  BP: (!) 121/58 (!) 114/92 (!) 83/68 (!) 117/43  Pulse:  91 (!) 120 94  Resp:    17  Temp:   98.2 F (36.8 C) 99.3 F (37.4 C)  TempSrc:      SpO2:  97% 97% 99%  Weight:      Height:        Intact pulses distally Incision: dressing C/D/I Compartment soft Patient not participating with motorsensory exam today.  Lab Results  Component Value Date   WBC 13.8 (H) 05/17/2024   HGB 11.9 (L) 05/17/2024   HCT 36.9 05/17/2024   MCV 95.1 05/17/2024   PLT 151 05/17/2024   BMET    Component Value Date/Time   NA 135 05/17/2024 1245   K 4.7 05/17/2024 1245   CL 101 05/17/2024 1245   CO2 23 05/17/2024 1245   GLUCOSE 155 (H) 05/17/2024 1245   BUN 19 05/17/2024 1245   CREATININE 0.87 05/17/2024 1245   CREATININE 0.82 09/09/2020 1119   CALCIUM 8.8 (L) 05/17/2024 1245   GFRNONAA >60 05/17/2024 1245      Xray: hip nail in good alignment no adverse features  Assessment/Plan: 3 Days Post-Op   Principal Problem:   Closed displaced intertrochanteric fracture of right femur, initial encounter (HCC) Active Problems:   Hypothyroidism   Essential hypertension   Mild dementia with agitation (HCC)  S/p R intertroch fx ORIF 05/17/24  POD 1: patient very drowsy. Dc'd morphine  and hydrocodone  5-10 and replaced with oxy 2.5. Patient also on scheduled tylenol . Could consider toradol if alternative pain med required. Seroquel  and haldol  also ordered per med team for night time agitation.  Post op recs: WB: WBAT RLE Abx: ancef  x23 hours post op Imaging: PACU xrays Dressing: keep intact until follow up, change PRN if soiled or saturated. DVT prophylaxis: lovenox  starting POD1 x4 weeks Follow up: 2 weeks after surgery for a wound check with Dr.  Edna at [facility].  Address: [address], [place], [place] [address]  Office Phone: [phone]    TORIBIO DELENA EDNA 05/20/2024, 7:09 AM   TORIBIO Edna, MD  Contact information:   [phone] 7am-5pm epic message Dr. Edna, or call office for patient follow up: [phone] After hours and holidays please check [email or web address] for group call information for [facility]

## 2024-05-20 NOTE — Plan of Care (Signed)

## 2024-05-21 DIAGNOSIS — S72141A Displaced intertrochanteric fracture of right femur, initial encounter for closed fracture: Secondary | ICD-10-CM | POA: Diagnosis not present

## 2024-05-21 LAB — GLUCOSE, CAPILLARY
Glucose-Capillary: 123 mg/dL — ABNORMAL HIGH (ref 70–99)
Glucose-Capillary: 145 mg/dL — ABNORMAL HIGH (ref 70–99)
Glucose-Capillary: 152 mg/dL — ABNORMAL HIGH (ref 70–99)
Glucose-Capillary: 195 mg/dL — ABNORMAL HIGH (ref 70–99)

## 2024-05-21 NOTE — Progress Notes (Signed)
 Occupational Therapy Treatment Patient Details Name: [name] [name] MRN: [id number] DOB: 05-13-1927 Today's Date: 05/21/2024   History of present illness 89 y.o. female presented to the ED 7/5 for evaluation after a fall at home. S/P 7/6 R hip ORIF PMH: HTN, hypothyroidism, dementia   OT comments  Patient received in supine, alert, and agreeable to OT/PT. Patient instructed on bed mobility and max assist to get to EOB. Patient requiring assistance for sitting balance due to posterior leaning. One stand performed from EOB with 2 person HHA and on 2nd stand performed transfer to recliner with max assist +2 HHA. Patient appeared fear full of falling but able to perform with increased time and cues throughout. Grooming tasks performed from recliner and patient positioned for comfort to increase OOB sitting tolerance. Patient will benefit from continued inpatient follow up therapy, <3 hours/day. Acute OT to continue to follow to address established goals to facilitate DC to next venue of care.        If plan is discharge home, recommend the following:  Two people to help with walking and/or transfers;A lot of help with bathing/dressing/bathroom;Assistance with feeding;Assistance with cooking/housework;Direct supervision/assist for medications management;Direct supervision/assist for financial management;Assist for transportation;Help with stairs or ramp for entrance;Supervision due to cognitive status   Equipment Recommendations  Other (comment) (defer to next venue of care)    Recommendations for Other Services      Precautions / Restrictions Precautions Precautions: Fall Recall of Precautions/Restrictions: Impaired Restrictions Weight Bearing Restrictions Per Provider Order: Yes RLE Weight Bearing Per Provider Order: Weight bearing as tolerated       Mobility Bed Mobility Overal bed mobility: Needs Assistance Bed Mobility: Supine to Sit     Supine to sit: Max assist     General bed  mobility comments: verbal cues throughout with max assist due to assistance needed with BLEs and trunk    Transfers Overall transfer level: Needs assistance Equipment used: 2 person hand held assist Transfers: Sit to/from Stand, Bed to chair/wheelchair/BSC Sit to Stand: Max assist, +2 physical assistance, +2 safety/equipment, Via lift equipment, From elevated surface Stand pivot transfers: Max assist, +2 physical assistance         General transfer comment: difficulty progressing LLE due to patient appears to not want to put weight through RLE     Balance Overall balance assessment: History of Falls, Needs assistance Sitting-balance support: Feet supported, Bilateral upper extremity supported Sitting balance-Leahy Scale: Poor Sitting balance - Comments: posterior leaning Postural control: Posterior lean Standing balance support: Bilateral upper extremity supported, Reliant on assistive device for balance Standing balance-Leahy Scale: Poor Standing balance comment: reliant on therapist for support                           ADL either performed or assessed with clinical judgement   ADL Overall ADL's : Needs assistance/impaired     Grooming: Wash/dry hands;Wash/dry face;Brushing hair;Minimal assistance;Sitting Grooming Details (indicate cue type and reason): min assist to complete brushing hair seated in recliner                               General ADL Comments: focused on bed mobility and transfers    Extremity/Trunk Assessment Upper Extremity Assessment Upper Extremity Assessment: Generalized weakness            Vision       Perception     Praxis  Communication Communication Factors Affecting Communication: Hearing impaired;Reduced clarity of speech   Cognition Arousal: Alert Behavior During Therapy: Anxious Cognition: History of cognitive impairments, Difficult to assess Difficult to assess due to: Hard of hearing/deaf, Level of  arousal           OT - Cognition Comments: more alert than previous visits. Appears anxious possible due to fear of falling                 Following commands: Impaired Following commands impaired: Follows one step commands with increased time, Follows one step commands inconsistently      Cueing   Cueing Techniques: Verbal cues, Gestural cues, Tactile cues, Visual cues  Exercises      Shoulder Instructions       General Comments more alert but quickly fatigues    Pertinent Vitals/ Pain       Pain Assessment Pain Assessment: Faces Faces Pain Scale: Hurts little more Pain Location: right hip with WB Pain Descriptors / Indicators: Grimacing, Guarding Pain Intervention(s): Limited activity within patient's tolerance, Monitored during session, Repositioned  Home Living                                          Prior Functioning/Environment              Frequency  Min 2X/week        Progress Toward Goals  OT Goals(current goals can now be found in the care plan section)  Progress towards OT goals: Progressing toward goals  Acute Rehab OT Goals Patient Stated Goal: to get more rehab OT Goal Formulation: With family Time For Goal Achievement: 06/02/24 Potential to Achieve Goals: Good ADL Goals Pt Will Perform Eating: sitting;with set-up Pt Will Perform Grooming: with supervision;sitting Pt Will Perform Upper Body Dressing: with min assist;sitting Pt Will Perform Lower Body Dressing: with max assist;sitting/lateral leans Pt Will Transfer to Toilet: with max assist;stand pivot transfer;bedside commode Pt Will Perform Toileting - Clothing Manipulation and hygiene: with mod assist;sitting/lateral leans  Plan      Co-evaluation    PT/OT/SLP Co-Evaluation/Treatment: Yes Reason for Co-Treatment: For patient/therapist safety;To address functional/ADL transfers PT goals addressed during session: Mobility/safety with  mobility;Balance;Proper use of DME;Strengthening/ROM OT goals addressed during session: ADL's and self-care;Strengthening/ROM      AM-PAC OT 6 Clicks Daily Activity     Outcome Measure   Help from another person eating meals?: A Lot Help from another person taking care of personal grooming?: A Little Help from another person toileting, which includes using toliet, bedpan, or urinal?: A Lot Help from another person bathing (including washing, rinsing, drying)?: A Lot Help from another person to put on and taking off regular upper body clothing?: A Lot Help from another person to put on and taking off regular lower body clothing?: Total 6 Click Score: 12    End of Session Equipment Utilized During Treatment: Gait belt  OT Visit Diagnosis: Unsteadiness on feet (R26.81);Other abnormalities of gait and mobility (R26.89);Muscle weakness (generalized) (M62.81);Pain Pain - Right/Left: Right Pain - part of body: Leg   Activity Tolerance Patient tolerated treatment well   Patient Left in chair;with call bell/phone within reach;with chair alarm set;with family/visitor present   Nurse Communication Mobility status;Precautions;Need for lift equipment        Time: 8955-8882 OT Time Calculation (min): 33 min  Charges: OT General Charges $OT Visit: 1 Visit  OT Treatments $Self Care/Home Management : 8-22 mins  Dick Laine, OTA Acute Rehabilitation Services  Office [phone]   Jeb LITTIE Laine 05/21/2024, 11:58 AM

## 2024-05-21 NOTE — Progress Notes (Signed)
 Physical Therapy Treatment Patient Details Name: [name] [name] MRN: [id number] DOB: 06/28/1927 Today's Date: 05/21/2024   History of Present Illness 89 y.o. female presented to the ED 7/5 for evaluation after a fall at home. S/P 7/6 R hip ORIF PMH: HTN, hypothyroidism, dementia    PT Comments  Pt much more alert and participatory with therapy today but fatigues easily. Pt allows PT to perform PROM to R hip prior to mobilization. Pt utilizing grimace as stopping point due to pt dementia and decreased response to questions about pain. Pt continues to be total A for bed mobility and is total Ax2 for stand step transfer from bed to chair. D/c plan remains appropriate for therapy. PT will continue to follow acutely.     If plan is discharge home, recommend the following: A lot of help with walking and/or transfers;A lot of help with bathing/dressing/bathroom;Assistance with cooking/housework;Direct supervision/assist for medications management;Direct supervision/assist for financial management;Assist for transportation;Help with stairs or ramp for entrance;Supervision due to cognitive status   Can travel by private vehicle      No  Equipment Recommendations  Rolling walker (2 wheels);BSC/3in1       Precautions / Restrictions Precautions Precautions: Fall Recall of Precautions/Restrictions: Impaired Restrictions Weight Bearing Restrictions Per Provider Order: Yes RLE Weight Bearing Per Provider Order: Weight bearing as tolerated     Mobility  Bed Mobility Overal bed mobility: Needs Assistance Bed Mobility: Supine to Sit     Supine to sit: Max assist     General bed mobility comments: verbal cues throughout with max assist due to assistance needed with BLEs and trunk    Transfers Overall transfer level: Needs assistance Equipment used: 2 person hand held assist Transfers: Sit to/from Stand, Bed to chair/wheelchair/BSC Sit to Stand: Max assist, +2 physical assistance, +2  safety/equipment, Via lift equipment, From elevated surface Stand pivot transfers: Max assist, +2 physical assistance         General transfer comment: difficulty progressing LLE due to patient appears to not want to put weight through RLE          Balance Overall balance assessment: History of Falls, Needs assistance Sitting-balance support: Feet supported, Bilateral upper extremity supported Sitting balance-Leahy Scale: Poor Sitting balance - Comments: posterior leaning Postural control: Posterior lean Standing balance support: Bilateral upper extremity supported, Reliant on assistive device for balance Standing balance-Leahy Scale: Poor Standing balance comment: reliant on therapist for support                            Communication Communication Factors Affecting Communication: Hearing impaired;Reduced clarity of speech  Cognition Arousal: Alert Behavior During Therapy: Anxious                             Following commands: Impaired Following commands impaired: Follows one step commands with increased time, Follows one step commands inconsistently    Cueing Cueing Techniques: Verbal cues, Gestural cues, Tactile cues, Visual cues  Exercises General Exercises - Lower Extremity Ankle Circles/Pumps: PROM, Both, 5 reps, Supine Hip ABduction/ADduction: PROM, Right, 10 reps, Supine Hip Flexion/Marching: PROM, Right, 5 reps, Supine Other Exercises Other Exercises: Hip IR x5 supine    General Comments General comments (skin integrity, edema, etc.): more alert but quickly fatigues, daughter in room eager for pt to participate, but pt with increased fatigue      Pertinent Vitals/Pain Pain Assessment Pain Assessment: Faces Faces Pain  Scale: Hurts little more Pain Location: right hip with WB Pain Descriptors / Indicators: Grimacing, Guarding Pain Intervention(s): Limited activity within patient's tolerance, Monitored during session, Repositioned      PT Goals (current goals can now be found in the care plan section) Acute Rehab PT Goals PT Goal Formulation: With family Time For Goal Achievement: 06/01/24 Potential to Achieve Goals: Fair Progress towards PT goals: Progressing toward goals    Frequency    Min 2X/week           Co-evaluation PT/OT/SLP Co-Evaluation/Treatment: Yes Reason for Co-Treatment: For patient/therapist safety;To address functional/ADL transfers PT goals addressed during session: Mobility/safety with mobility;Balance;Proper use of DME;Strengthening/ROM OT goals addressed during session: ADL's and self-care;Strengthening/ROM      AM-PAC PT 6 Clicks Mobility   Outcome Measure  Help needed turning from your back to your side while in a flat bed without using bedrails?: Total Help needed moving from lying on your back to sitting on the side of a flat bed without using bedrails?: Total Help needed moving to and from a bed to a chair (including a wheelchair)?: Total Help needed standing up from a chair using your arms (e.g., wheelchair or bedside chair)?: Total Help needed to walk in hospital room?: Total Help needed climbing 3-5 steps with a railing? : Total 6 Click Score: 6    End of Session Equipment Utilized During Treatment: Gait belt Activity Tolerance: Patient tolerated treatment well Patient left: in chair;with chair alarm set;with family/visitor present;with call bell/phone within reach Nurse Communication: Mobility status PT Visit Diagnosis: Muscle weakness (generalized) (M62.81);Pain;Other abnormalities of gait and mobility (R26.89)     Time: 8955-8882 PT Time Calculation (min) (ACUTE ONLY): 33 min  Charges:    $Therapeutic Activity: 8-22 mins PT General Charges $$ ACUTE PT VISIT: 1 Visit                     [name] [name] B. Fleeta Lapidus PT, DPT Acute Rehabilitation Services Please use secure chat or  Call Office [phone]'    [name] [name] [facility] 05/21/2024, 12:06  PM

## 2024-05-21 NOTE — Progress Notes (Signed)
 Nutrition Follow-up  DOCUMENTATION CODES:   Not applicable  INTERVENTION:  -Encourage PO intake Room service with assist Nursing to assist with feeding patient -Continue Ensure Plus High Protein po BID -Continue Mighty Shake TID with meals -Continue MVI with minerals daily  -Continue 1,000 units Vitamin D  daily  -Continue 100 mg Thiamine  x 5 days   NUTRITION DIAGNOSIS:   Increased nutrient needs related to acute illness, hip fracture as evidenced by estimated needs.  Continues to be appropriate  GOAL:   Patient will meet greater than or equal to 90% of their needs  Progressing  MONITOR:   PO intake, Supplement acceptance, Labs, Weight trends, Skin  REASON FOR ASSESSMENT:   Consult Assessment of nutrition requirement/status, Hip fracture protocol  ASSESSMENT:   89 y.o Female with PMH of dementia, HTN, hypothyroidism who presented to ED after a fall and found to have right femur fracture.  Spoke to pt and pt's daughter in room. Pt did not answering any of my questions, questions answered by daughter. Pt with no noted n/v/c/d or chewing/swallowing difficulties. Last BM 7/9. Per daughter, pt appetite has improved, has eaten well last 3 meals. Documented PO intake is 20% bk, 50% lunch today. Pt also has had some Ensure Plus HP (unsure of amount), continue BID to promote adequate kcal/pro intake. Discussed importance of adequate PO intake with daughter, daughter verbalized understanding. Per daughter, pt typically eats very well, weight has been stable. Unable to complete NFPE at this visit as pt was being helped to the commode partway through visit. Visible areas appear to have significant muscle wasting, NFPE at f/u. Pt's daughter denies additional questions/concerns at this time, will continue to monitor, RDN available prn.   Labs BG 123-155 BUN 50 Calcum 8.4 GFR 57 H/H 8.7/26.4  Medications Vit D3 Lovenox  Levothyroxine  Lisinopril  MVI w/min Thiamine   NUTRITION  - FOCUSED PHYSICAL EXAM:  Unable to complete today, complete at f/u  Diet Order:   Diet Order             Diet regular Room service appropriate? Yes with Assist; Fluid consistency: Thin  Diet effective now                   EDUCATION NEEDS:   Not appropriate for education at this time  Skin:  Skin Assessment: Skin Integrity Issues: Skin Integrity Issues:: Incisions Incisions: Closed surgical incision R hip  Last BM:  7/9  Height:   Ht Readings from Last 1 Encounters:  05/17/24 5' 2.99 (1.6 m)    Weight:   Wt Readings from Last 1 Encounters:  05/17/24 49.9 kg    BMI:  Body mass index is 19.49 kg/m.  Estimated Nutritional Needs:   Kcal:  1400-1600 kcal  Protein:  70-90 gm  Fluid:  >1.4L/day    Lagina Reader Daml-Budig, RDN, LDN Registered Dietitian Nutritionist RD Inpatient Contact Info in [place]

## 2024-05-21 NOTE — Progress Notes (Signed)
 Order placed for foley this nurse placed 46F foley with 11f balloon buddy Antoinette RN at beside with this nurse noted clear yellow urine in tubing bed in lowest position with family at beside

## 2024-05-21 NOTE — Plan of Care (Signed)

## 2024-05-21 NOTE — Progress Notes (Signed)
 PROGRESS NOTE    [name] [name]  FMW:[id number] DOB: Apr 25, 1927 DOA: 05/16/2024 PCP: Antonio Cyndee Jamee JONELLE, DO    Brief Narrative:  89 year old with history of hypertension, hypothyroidism, dementia from home presented with fall.  She was found on the floor with telephone cord wrapped around her leg.  In the emergency room hemodynamically stable.  She was found to have right intertrochanteric fracture.  Other skeletal survey was negative. 7/6, underwent ORIF right hip.  Subjective:  Patient seen and examined.  Her daughter Darice at the bedside.  Patient is more awake today.  Daughter is trying to assist her feeding. Patient is pleasant but completely confused.  She sees her husband with her who has died 5 years ago and apparently this is her baseline. Patient had to have straight cath for more than 48 hours, Foley catheter is placed. Pain is controlled. Discontinued Seroquel  last night.   Assessment & Plan:   Closed traumatic right hip fracture: ORIF, intramedullary nail 7/6, Dr. Edna Weightbearing as tolerated Adequate pain medications, mostly none opiates.  Opiate for severe pain.   DVT prophylaxis, Lovenox  subcu Postop follow-up as per surgery.  Essential hypertension: Continue lisinopril .  Hypothyroidism: On Synthroid .  Dementia with delirium and agitation:  Developed postop delirium and lethargy.  Treated with different medications.   She does have some hallucination which is her baseline.   Discontinuing Seroquel .   Fall precautions.  Delirium precautions. Mobilize with PT OT.  Refer to SNF for rehab.  Leukocytosis: Likely stress reaction.  Normalized.  Urinary retention: Due to sleepiness and hip fracture.  Multiple straight catheter to be done.  Foley catheter was placed.  Will continue Foley catheter until she has improved mobility.  DVT prophylaxis: enoxaparin  (LOVENOX ) injection 30 mg Start: 05/21/24 0900 SCDs Start: 05/16/24 1952   Code Status: DNR with  limited intervention Family Communication: Patient's daughter Darice at the bedside.  Disposition Plan: Status is: Inpatient Remains inpatient appropriate because: Medically stable.  Needs to go to skilled nursing facility.     Consultants:  Orthopedics  Procedures:  ORIF right hip  Antimicrobials:  Perioperative     Objective: Vitals:   05/20/24 2040 05/21/24 0028 05/21/24 0515 05/21/24 0742  BP: (!) 161/94 (!) 151/66 125/62 (!) 151/72  Pulse: (!) 106 94 80 89  Resp: 18 18 18 17   Temp: 98.1 F (36.7 C) 98.2 F (36.8 C) 97.8 F (36.6 C) 98.1 F (36.7 C)  TempSrc: Oral Oral Oral Oral  SpO2: 100% 91% 100% 99%  Weight:      Height:        Intake/Output Summary (Last 24 hours) at 05/21/2024 1105 Last data filed at 05/21/2024 0500 Gross per 24 hour  Intake --  Output 1000 ml  Net -1000 ml   Filed Weights   05/16/24 1413 05/17/24 0717  Weight: 49.9 kg 49.9 kg    Examination:  General: Patient is alert and awake today.  She is not oriented.  She does not recognize her daughter. Cardiovascular: S1-S2 normal.  Regular rate rhythm. Respiratory: Bilateral clear.  No added sounds. Gastrointestinal: Soft.  Nontender.  Bowel sound present. Ext: No deformities.  Right lateral thigh incision clean and intact. Neuro: Alert and awake.  Oriented x 0.  She sees her dead husband talking to her.    Data Reviewed: I have personally reviewed following labs and imaging studies  CBC: Recent Labs  Lab 05/16/24 1440 05/17/24 1245 05/20/24 0816  WBC 13.2* 13.8* 9.3  NEUTROABS 11.6*  --   --  HGB 14.0 11.9* 8.7*  HCT 41.6 36.9 26.4*  MCV 95.6 95.1 94.3  PLT 159 151 147*   Basic Metabolic Panel: Recent Labs  Lab 05/16/24 1440 05/17/24 1245 05/20/24 0816  NA 142 135 136  K 4.2 4.7 3.9  CL 106 101 105  CO2 26 23 23   GLUCOSE 117* 155* 123*  BUN 27* 19 50*  CREATININE 0.83 0.87 0.92  CALCIUM 9.3 8.8* 8.4*   GFR: Estimated Creatinine Clearance: 28.8 mL/min (by C-G  formula based on SCr of 0.92 mg/dL). Liver Function Tests: Recent Labs  Lab 05/16/24 1440  AST 21  ALT 18  ALKPHOS 75  BILITOT 0.8  PROT 6.6  ALBUMIN 3.5   No results for input(s): LIPASE, AMYLASE in the last 168 hours. No results for input(s): AMMONIA in the last 168 hours. Coagulation Profile: Recent Labs  Lab 05/16/24 1440  INR 0.9   Cardiac Enzymes: Recent Labs  Lab 05/16/24 1440  CKTOTAL 71   BNP (last 3 results) No results for input(s): PROBNP in the last 8760 hours. HbA1C: No results for input(s): HGBA1C in the last 72 hours. CBG: Recent Labs  Lab 05/20/24 2307 05/21/24 0744  GLUCAP 119* 123*   Lipid Profile: No results for input(s): CHOL, HDL, LDLCALC, TRIG, CHOLHDL, LDLDIRECT in the last 72 hours. Thyroid  Function Tests: No results for input(s): TSH, T4TOTAL, FREET4, T3FREE, THYROIDAB in the last 72 hours. Anemia Panel: No results for input(s): VITAMINB12, FOLATE, FERRITIN, TIBC, IRON, RETICCTPCT in the last 72 hours. Sepsis Labs: No results for input(s): PROCALCITON, LATICACIDVEN in the last 168 hours.  Recent Results (from the past 240 hours)  Surgical pcr screen     Status: None   Collection Time: 05/17/24  5:53 AM   Specimen: Nasal Mucosa; Nasal Swab  Result Value Ref Range Status   MRSA, PCR NEGATIVE NEGATIVE Final   Staphylococcus aureus NEGATIVE NEGATIVE Final    Comment: (NOTE) The Xpert SA Assay (FDA approved for NASAL specimens in patients 16 years of age and older), is one component of a comprehensive surveillance program. It is not intended to diagnose infection nor to guide or monitor treatment. Performed at [facility], [address] [place], [place] [address]          Radiology Studies: No results found.       Scheduled Meds:  acetaminophen   1,000 mg Oral Q6H   Chlorhexidine  Gluconate Cloth  6 each Topical Daily   cholecalciferol   1,000 Units Oral Daily    enoxaparin  (LOVENOX ) injection  30 mg Subcutaneous Q24H   feeding supplement  237 mL Oral BID BM   levothyroxine   125 mcg Oral Q0600   lisinopril   20 mg Oral Daily   multivitamin with minerals  1 tablet Oral Daily   thiamine   100 mg Oral Daily   Continuous Infusions:     LOS: 5 days    Time spent: 40 minutes    Renato Applebaum, MD [facility]

## 2024-05-21 NOTE — TOC Progression Note (Signed)
 Transition of Care [facility]) - Progression Note    Patient Details  Name: [name] [name] MRN: [id number] Date of Birth: 12/07/1926  [facility] CM/SW Contact  Almarie CHRISTELLA Goodie, [place] Phone Number: 05/21/2024, 1:40 PM  Clinical Narrative:   CSW updated by MD that patient is hopefully stable for discharge tomorrow. CSW coordinated with PT and OT assigned for updated notes, and then contacted CMA to initiate insurance authorization. CSW updated [employer], and spoke with daughter, Darice, to provide update and answer questions. CSW to follow.    Expected Discharge Plan: Skilled Nursing Facility Barriers to Discharge: English as a second language teacher, Continued Medical Work up  Expected Discharge Plan and Services                                               Social Determinants of Health (SDOH) Interventions SDOH Screenings   Food Insecurity: No Food Insecurity (05/16/2024)  Housing: Low Risk  (05/16/2024)  Transportation Needs: No Transportation Needs (05/16/2024)  Utilities: Not At Risk (05/16/2024)  Alcohol Screen: Low Risk  (03/16/2022)  Depression (PHQ2-9): Medium Risk (01/01/2023)  Financial Resource Strain: Low Risk  (03/16/2022)  Physical Activity: Insufficiently Active (03/16/2022)  Social Connections: Socially Isolated (05/16/2024)  Stress: Stress Concern Present (03/16/2022)  Tobacco Use: Low Risk  (05/19/2024)    Readmission Risk Interventions     No data to display

## 2024-05-22 ENCOUNTER — Emergency Department (HOSPITAL_COMMUNITY)
Admission: EM | Admit: 2024-05-22 | Discharge: 2024-05-23 | Disposition: A | Discharge status: Skilled Nursing Facility | Attending: Emergency Medicine | Admitting: Emergency Medicine

## 2024-05-22 ENCOUNTER — Encounter (HOSPITAL_COMMUNITY): Payer: Self-pay | Admitting: Emergency Medicine

## 2024-05-22 ENCOUNTER — Other Ambulatory Visit: Payer: Self-pay

## 2024-05-22 DIAGNOSIS — S72141A Displaced intertrochanteric fracture of right femur, initial encounter for closed fracture: Secondary | ICD-10-CM | POA: Diagnosis not present

## 2024-05-22 DIAGNOSIS — S72001D Fracture of unspecified part of neck of right femur, subsequent encounter for closed fracture with routine healing: Secondary | ICD-10-CM

## 2024-05-22 LAB — GLUCOSE, CAPILLARY: Glucose-Capillary: 102 mg/dL — ABNORMAL HIGH (ref 70–99)

## 2024-05-22 MED ORDER — ACETAMINOPHEN 500 MG PO TABS
1000.0000 mg | ORAL_TABLET | Freq: Once | ORAL | Status: AC
  Administered 2024-05-22: 1000 mg via ORAL
  Filled 2024-05-22: qty 2

## 2024-05-22 MED ORDER — VITAMIN D3 25 MCG PO TABS: 1000.0000 [IU] | ORAL_TABLET | Freq: Every day | ORAL | Status: AC

## 2024-05-22 MED ORDER — ACETAMINOPHEN 325 MG PO TABS
325.0000 mg | ORAL_TABLET | Freq: Once | ORAL | Status: DC
  Filled 2024-05-22: qty 1

## 2024-05-22 NOTE — Progress Notes (Signed)
 Physical Therapy Treatment Patient Details Name: [name] [name] MRN: [id number] DOB: 04-Feb-1927 Today's Date: 05/22/2024   History of Present Illness 89 y.o. female presented to the ED 7/5 for evaluation after a fall at home. S/P 7/6 R hip ORIF PMH: HTN, hypothyroidism, dementia    PT Comments  Pt supine in bed.  Dtr present and wants PT before d/c to SNF.  Pt with improved carryover this session with use of sara stedy sit to stand lift.  Pt actively participated in forward weight shfiting.  Remains limited due to decreased strength in core, UE and B LEs.  RLE in flexed stance with adduction coming to standing.  PTA faciliated Extension and ABduction.  Plan for post acute rehab remains appropriate to improve strength and function before returning home.      If plan is discharge home, recommend the following: A lot of help with walking and/or transfers;A lot of help with bathing/dressing/bathroom;Assistance with cooking/housework;Direct supervision/assist for medications management;Direct supervision/assist for financial management;Assist for transportation;Help with stairs or ramp for entrance;Supervision due to cognitive status   Can travel by private vehicle        Equipment Recommendations  Rolling walker (2 wheels);BSC/3in1    Recommendations for Other Services       Precautions / Restrictions Precautions Precautions: Fall Restrictions Weight Bearing Restrictions Per Provider Order: Yes RLE Weight Bearing Per Provider Order: Weight bearing as tolerated     Mobility  Bed Mobility Overal bed mobility: Needs Assistance Bed Mobility: Rolling, Sidelying to Sit Rolling: Mod assist Sidelying to sit: Max assist       General bed mobility comments: Pt required assistance to roll to R and L side this session.  Pt able reach for rail but required +1 mod assistance to roll her hips in hooklying.  Pt Performed sidelying to sit with max assistance +1.  Once in sitting presents with  posterior bias and posterior pelvic tilt.    Transfers Overall transfer level: Needs assistance Equipment used: Ambulation equipment used (sara stedy) Transfers: Sit to/from Stand Sit to Stand: Max assist           General transfer comment: Cues for hand placement to pull on sara stedy cross bar to achieve standing.  Foam rolled up placed bewteen her knees to abduct Her L hip.  Facilitation to R knee and hip to encourage extension.  Performed x 3 trials in sara stedy.  Flexed posture noted with cues for scap retraction and head control.    Ambulation/Gait Ambulation/Gait assistance:  (NT remains to require significant assistance for standing.)                 Stairs             Wheelchair Mobility     Tilt Bed    Modified Rankin (Stroke Patients Only)       Balance                                            Communication Communication Communication: Other (comment) Factors Affecting Communication: Hearing impaired;Reduced clarity of speech  Cognition Arousal: Alert Behavior During Therapy: Anxious   PT - Cognitive impairments: Difficult to assess Difficult to assess due to: Level of arousal                     PT - Cognition Comments: pt barely responds  to her name during session, but as session progressed became more alert and talkative at end of session she returned to eyes closed and minimal response. Following commands: Impaired Following commands impaired: Follows one step commands with increased time, Follows one step commands inconsistently    Cueing Cueing Techniques: Verbal cues, Gestural cues, Tactile cues, Visual cues  Exercises General Exercises - Lower Extremity Ankle Circles/Pumps: AROM, Both, 10 reps, Supine Quad Sets: AROM, Right, 10 reps, Supine Heel Slides: AAROM, Right, 10 reps, Supine Hip ABduction/ADduction: AAROM, Right, 10 reps, Supine    General Comments        Pertinent Vitals/Pain Pain  Assessment Pain Assessment: Faces Faces Pain Scale: Hurts even more Pain Location: right hip with WB Pain Descriptors / Indicators: Grimacing, Guarding Pain Intervention(s): Monitored during session, Repositioned, Ice applied    Home Living                          Prior Function            PT Goals (current goals can now be found in the care plan section) Acute Rehab PT Goals PT Goal Formulation: With family Potential to Achieve Goals: Fair Progress towards PT goals: Progressing toward goals    Frequency    Min 2X/week      PT Plan      Co-evaluation              AM-PAC PT 6 Clicks Mobility   Outcome Measure  Help needed turning from your back to your side while in a flat bed without using bedrails?: Total Help needed moving from lying on your back to sitting on the side of a flat bed without using bedrails?: Total Help needed moving to and from a bed to a chair (including a wheelchair)?: Total Help needed standing up from a chair using your arms (e.g., wheelchair or bedside chair)?: Total Help needed to walk in hospital room?: Total Help needed climbing 3-5 steps with a railing? : Total 6 Click Score: 6    End of Session Equipment Utilized During Treatment: Gait belt Activity Tolerance: Patient tolerated treatment well Patient left: in chair;with chair alarm set;with family/visitor present;with call bell/phone within reach Nurse Communication: Mobility status;Need for lift equipment PT Visit Diagnosis: Muscle weakness (generalized) (M62.81);Pain;Other abnormalities of gait and mobility (R26.89)     Time: 8876-8841 PT Time Calculation (min) (ACUTE ONLY): 35 min  Charges:    $Therapeutic Exercise: 8-22 mins $Therapeutic Activity: 8-22 mins PT General Charges $$ ACUTE PT VISIT: 1 Visit                     Toya HAMS , PTA Acute Rehabilitation Services Office [phone]    Risha Barretta JINNY Gosling 05/22/2024, 12:10 PM

## 2024-05-22 NOTE — Plan of Care (Signed)

## 2024-05-22 NOTE — Discharge Summary (Signed)
 Physician Discharge Summary  [name] [name] FMW:[id number] DOB: [birth year] DOA: 05/16/2024  PCP: Antonio Cyndee Jamee JONELLE, DO  Admit date: 05/16/2024 Discharge date: 05/22/2024  Admitted From: Home Disposition: Skilled nursing facility  Recommendations for Outpatient Follow-up:  Follow up with PCP in 1-2 weeks Please obtain BMP/CBC in one week Orthopedics to schedule follow-up  Discharge Condition: Fair CODE STATUS: DNR with limited intervention Diet recommendation: Regular diet, nutritional supplements, assist.  Discharge summary: 89 year old with history of hypertension, hypothyroidism, dementia from home presented with fall.  She was found on the floor with telephone cord wrapped around her leg.  In the emergency room hemodynamically stable.  She was found to have right intertrochanteric fracture.  Other skeletal survey was negative. 7/6, underwent ORIF right hip. Patient developed some postop delirium however significantly improved now.  She has baseline dementia.  Able to go to a skilled nursing facility today for rehab.   Assessment & Plan:   Closed traumatic right hip fracture: ORIF, intramedullary nail 7/6, Dr. Edna Weightbearing as tolerated Adequate pain medications, mostly non-opiates.  Opiate for severe pain.   DVT prophylaxis, Lovenox  subcu Postop follow-up as per surgery.  Surgically stabilized.   Essential hypertension: Continue lisinopril .   Hypothyroidism: On Synthroid .   Dementia with delirium and agitation:  Developed postop delirium and lethargy.  Treated with different medications.   She does have some hallucination which is her baseline.   Fall precautions.  Delirium precautions. Mobilize with PT OT.  Refer to SNF for rehab. Symptoms improved.  She has baseline forgetfulness and some hallucination.  But she is overall stable.  She has not required any medication management.   Urinary retention: Due to sleepiness and hip fracture.  Multiple straight  catheter were sone. Foley catheter was placed.  Will continue Foley catheter until she has improved mobility. Please remove Foley catheter in 1 week or earlier if she has improved mobility and wakefulness.  Stable to transition to a skilled nursing facility.   Discharge Diagnoses:  Principal Problem:   Closed displaced intertrochanteric fracture of right femur, initial encounter [facility]) Active Problems:   Hypothyroidism   Essential hypertension   Mild dementia with agitation [facility])    Discharge Instructions  Discharge Instructions     Diet general   Complete by: As directed    Discharge instructions   Complete by: As directed    Remove foley catheter in one week or before if she starts mobilizing out of bed frequently   Increase activity slowly   Complete by: As directed    No wound care   Complete by: As directed       Allergies as of 05/22/2024   No Known Allergies      Medication List     STOP taking these medications    QUEtiapine  25 MG tablet Commonly known as: SEROquel        TAKE these medications    enoxaparin  40 MG/0.4ML injection Commonly known as: LOVENOX  Inject 0.4 mLs (40 mg total) into the skin daily for 28 days.   ICAPS AREDS 2 PO Take 1 tablet by mouth daily.   levothyroxine  125 MCG tablet Commonly known as: SYNTHROID  TAKE ONE TABLET ( TOTAL) BY MOUTH DAILY   lisinopril  20 MG tablet Commonly known as: ZESTRIL  TAKE ONE TABLET BY MOUTH ONCE DAILY   oxyCODONE  5 MG immediate release tablet Commonly known as: Roxicodone  Take 0.5-1 tablets (2.5-5 mg total) by mouth every 4 (four) hours as needed for up to 7 days for severe  pain (pain score 7-10) or moderate pain (pain score 4-6).   vitamin D3 25 MCG tablet Commonly known as: CHOLECALCIFEROL  Take 1 tablet (1,000 Units total) by mouth daily.        Contact information for follow-up providers     Edna Toribio LABOR, MD Follow up in 2 week(s).   Specialty: Orthopedic  Surgery Contact information: [address] [place] [address] [phone]              Contact information for after-discharge care     Destination     [facility], [place] .   Service: Skilled Nursing Contact information: [address] [place]  [phone]                    No Known Allergies  Consultations: Orthopedics   Procedures/Studies: DG FEMUR PORT, MIN 2 VIEWS RIGHT Result Date: 05/17/2024 CLINICAL DATA:  Postop fixation of intertrochanteric right femur fracture. EXAM: RIGHT FEMUR PORTABLE 2 VIEW COMPARISON:  Radiographs 05/16/2024. FINDINGS: Four portable views of the right femur demonstrate recent right femoral ORIF with an intramedullary nail, dynamic femoral neck screws and 2 distal interlocking screws. The hardware is well positioned. Improved alignment of the comminuted intertrochanteric femur fracture. No complications are identified. There are moderate tricompartmental degenerative changes at the right knee. A small amount of soft tissue emphysema is noted lateral to the right hip. Scattered vascular calcifications are present. IMPRESSION: Status post ORIF of comminuted intertrochanteric right femur fracture with improved alignment. No complications identified. Electronically Signed   By: Elsie Perone M.D.   On: 05/17/2024 11:21   DG HIP UNILAT WITH PELVIS 2-3 VIEWS RIGHT Result Date: 05/17/2024 CLINICAL DATA:  Intertrochanteric femur fracture post fixation. EXAM: DG HIP (WITH OR WITHOUT PELVIS) 2-3V RIGHT COMPARISON:  Radiographs 05/16/2024. FINDINGS: C-arm fluoroscopy was provided in the operating room without the presence of a radiologist.2 minutes and 10 seconds fluoroscopy time. 10.75 mGy air kerma. Eleven C-arm fluoroscopic images were obtained intraoperatively and are submitted for post operative interpretation. Please see intraoperative findings for further detail. Postsurgical  radiographs of the pelvis and right hip demonstrate a right femoral intramedullary nail with dynamic interlocking screws in the femoral neck. There is improved alignment of the intertrochanteric femur fracture. The hardware appears well positioned. No complications are identified. There is a small amount of gas in the lateral soft tissues. IMPRESSION: Intraoperative fluoroscopic guidance for right femoral fracture fixation. No complications identified. Electronically Signed   By: Elsie Perone M.D.   On: 05/17/2024 11:18   DG C-Arm 1-60 Min-No Report Result Date: 05/17/2024 Fluoroscopy was utilized by the requesting physician.  No radiographic interpretation.   CT CERVICAL SPINE WO CONTRAST Result Date: 05/16/2024 CLINICAL DATA:  Neck trauma (Age >= 65y).  Fall. EXAM: CT CERVICAL SPINE WITHOUT CONTRAST TECHNIQUE: Multidetector CT imaging of the cervical spine was performed without intravenous contrast. Multiplanar CT image reconstructions were also generated. RADIATION DOSE REDUCTION: This exam was performed according to the departmental dose-optimization program which includes automated exposure control, adjustment of the mA and/or kV according to patient size and/or use of iterative reconstruction technique. COMPARISON:  08/27/2023 FINDINGS: Alignment: 2 mm anterolisthesis of C2 on C3 related to degenerative facet disease. Skull base and vertebrae: No acute fracture. No primary bone lesion or focal pathologic process. Soft tissues and spinal canal: No prevertebral fluid or swelling. No visible canal hematoma. Disc levels: Disc space narrowing and spurring most notable from C3-4 through C6-7.  Moderate bilateral degenerative facet disease. Left greater than right. Multilevel bilateral neural foraminal narrowing, left greater than right. Upper chest: No acute findings Other: None IMPRESSION: Degenerative disc and facet disease. No acute bony abnormality. Electronically Signed   By: Franky Crease M.D.   On:  05/16/2024 17:23   CT HEAD WO CONTRAST Result Date: 05/16/2024 CLINICAL DATA:  Head trauma, minor (Age >= 65y).  Fall EXAM: CT HEAD WITHOUT CONTRAST TECHNIQUE: Contiguous axial images were obtained from the base of the skull through the vertex without intravenous contrast. RADIATION DOSE REDUCTION: This exam was performed according to the departmental dose-optimization program which includes automated exposure control, adjustment of the mA and/or kV according to patient size and/or use of iterative reconstruction technique. COMPARISON:  04/27/2024 FINDINGS: Brain: No acute intracranial abnormality. Specifically, no hemorrhage, hydrocephalus, mass lesion, acute infarction, or significant intracranial injury. There is atrophy and chronic small vessel disease changes. Vascular: No hyperdense vessel or unexpected calcification. Skull: No acute calvarial abnormality. Sinuses/Orbits: No acute findings Other: None IMPRESSION: Atrophy, chronic microvascular disease. No acute intracranial abnormality. Electronically Signed   By: Franky Crease M.D.   On: 05/16/2024 17:22   DG Hip Unilat With Pelvis 2-3 Views Right Result Date: 05/16/2024 CLINICAL DATA:  Unwitnessed fall EXAM: DG HIP (WITH OR WITHOUT PELVIS) 2-3V RIGHT COMPARISON:  04/27/2024 FINDINGS: SI joints are non widened. Pubic symphysis and rami appear intact. Moderate left hip degenerative change. Acute comminuted and displaced right intertrochanteric fracture with varus angulation of distal fracture fragment. No femoral head dislocation IMPRESSION: Acute comminuted and displaced right intertrochanteric fracture. Electronically Signed   By: Luke Bun M.D.   On: 05/16/2024 15:33   DG Chest Port 1 View Result Date: 05/16/2024 CLINICAL DATA:  Unwitnessed fall EXAM: PORTABLE CHEST 1 VIEW COMPARISON:  04/27/2024 FINDINGS: Mild chronic bronchitic changes. No acute airspace disease or effusion. Mitral annular calcification. Stable cardiomediastinal silhouette with  aortic atherosclerosis. No pneumothorax IMPRESSION: No active disease. Mild chronic bronchitic changes. Electronically Signed   By: Luke Bun M.D.   On: 05/16/2024 15:31   DG Knee Complete 4 Views Right Result Date: 04/27/2024 CLINICAL DATA:  Status post fall EXAM: RIGHT KNEE - COMPLETE 4+ VIEW COMPARISON:  None Available. FINDINGS: Advanced tricompartmental osteoarthrosis without fractures. Marginal osteophytes as well as calcifications of the menisci consistent with chondrocalcinosis. IMPRESSION: Advanced tricompartmental osteoarthrosis without fractures. Electronically Signed   By: Franky Chard M.D.   On: 04/27/2024 15:21   DG Pelvis 1-2 Views Result Date: 04/27/2024 CLINICAL DATA:  Status post fall EXAM: PELVIS - 1-2 VIEW COMPARISON:  None Available. FINDINGS: There is no evidence of pelvic fracture or diastasis. No pelvic bone lesions are seen. IMPRESSION: Negative. Electronically Signed   By: Franky Chard M.D.   On: 04/27/2024 15:21   DG Chest 2 View Result Date: 04/27/2024 CLINICAL DATA:  Found down. EXAM: CHEST - 2 VIEW COMPARISON:  Chest x-ray 06/02/2020. FINDINGS: Hyperinflation. Film is rotated to the left. No consolidation, pneumothorax. Tiny pleural effusions seen on lateral view bilaterally. Diffuse interstitial changes are seen which is likely chronic. Calcified and tortuous aorta. Normal cardiopericardial silhouette when adjusted for rotation. Prominent calcifications along the mitral valve annulus. Overlapping cardiac leads. Osteopenia and degenerative changes. IMPRESSION: Hyperinflation with chronic changes.  Tiny pleural effusions. Electronically Signed   By: Ranell Bring M.D.   On: 04/27/2024 15:13   CT Head Wo Contrast Result Date: 04/27/2024 CLINICAL DATA:  Head trauma, minor (Age >= 65y).  Found down. EXAM: CT HEAD WITHOUT  CONTRAST TECHNIQUE: Contiguous axial images were obtained from the base of the skull through the vertex without intravenous contrast. RADIATION DOSE  REDUCTION: This exam was performed according to the departmental dose-optimization program which includes automated exposure control, adjustment of the mA and/or kV according to patient size and/or use of iterative reconstruction technique. COMPARISON:  08/27/2023 FINDINGS: Brain: There is atrophy and chronic small vessel disease changes. No acute intracranial abnormality. Specifically, no hemorrhage, hydrocephalus, mass lesion, acute infarction, or significant intracranial injury. Vascular: No hyperdense vessel or unexpected calcification. Skull: No acute calvarial abnormality. Sinuses/Orbits: No acute findings Other: None IMPRESSION: Atrophy, chronic microvascular disease. No acute intracranial abnormality. Electronically Signed   By: Franky Crease M.D.   On: 04/27/2024 14:37   (Echo, Carotid, EGD, Colonoscopy, ERCP)    Subjective: Patient seen and examined.  Daughter Channing at the bedside.  Patient is alert awake.  She is pleasant interactive but very forgetful.   Discharge Exam: Vitals:   05/22/24 0427 05/22/24 0805  BP: (!) 123/48 (!) 117/44  Pulse: 73 65  Resp: 18   Temp: 98.2 F (36.8 C) 97.9 F (36.6 C)  SpO2: 99% 97%   Vitals:   05/21/24 2028 05/22/24 0033 05/22/24 0427 05/22/24 0805  BP: 123/63 (!) 132/58 (!) 123/48 (!) 117/44  Pulse: 77 73 73 65  Resp: 18 16 18    Temp: 97.8 F (36.6 C) 97.7 F (36.5 C) 98.2 F (36.8 C) 97.9 F (36.6 C)  TempSrc:  Axillary    SpO2: 97% 100% 99% 97%  Weight:      Height:        General: Pt is alert, awake, patient is oriented to herself only.  Not in acute distress Keeps eyes closed.  She looks comfortable.  Being assisted by her daughter to eat. Cardiovascular: RRR, S1/S2 +, no rubs, no gallops Respiratory: CTA bilaterally, no wheezing, no rhonchi Abdominal: Soft, NT, ND, bowel sounds + Extremities: no edema, no cyanosis Right lateral thigh incisions clean and dry.    The results of significant diagnostics from this  hospitalization (including imaging, microbiology, ancillary and laboratory) are listed below for reference.     Microbiology: Recent Results (from the past 240 hours)  Surgical pcr screen     Status: None   Collection Time: 05/17/24  5:53 AM   Specimen: Nasal Mucosa; Nasal Swab  Result Value Ref Range Status   MRSA, PCR NEGATIVE NEGATIVE Final   Staphylococcus aureus NEGATIVE NEGATIVE Final    Comment: (NOTE) The Xpert SA Assay (FDA approved for NASAL specimens in patients 110 years of age and older), is one component of a comprehensive surveillance program. It is not intended to diagnose infection nor to guide or monitor treatment. Performed at [facility], [address], [place], [place] [address]      Labs: BNP (last 3 results) No results for input(s): BNP in the last 8760 hours. Basic Metabolic Panel: Recent Labs  Lab 05/16/24 1440 05/17/24 1245 05/20/24 0816  NA 142 135 136  K 4.2 4.7 3.9  CL 106 101 105  CO2 26 23 23   GLUCOSE 117* 155* 123*  BUN 27* 19 50*  CREATININE 0.83 0.87 0.92  CALCIUM 9.3 8.8* 8.4*   Liver Function Tests: Recent Labs  Lab 05/16/24 1440  AST 21  ALT 18  ALKPHOS 75  BILITOT 0.8  PROT 6.6  ALBUMIN 3.5   No results for input(s): LIPASE, AMYLASE in the last 168 hours. No results for input(s): AMMONIA in the  last 168 hours. CBC: Recent Labs  Lab 05/16/24 1440 05/17/24 1245 05/20/24 0816  WBC 13.2* 13.8* 9.3  NEUTROABS 11.6*  --   --   HGB 14.0 11.9* 8.7*  HCT 41.6 36.9 26.4*  MCV 95.6 95.1 94.3  PLT 159 151 147*   Cardiac Enzymes: Recent Labs  Lab 05/16/24 1440  CKTOTAL 71   BNP: Invalid input(s): POCBNP CBG: Recent Labs  Lab 05/21/24 0744 05/21/24 1110 05/21/24 1641 05/21/24 2103 05/22/24 0805  GLUCAP 123* 145* 152* 195* 102*   D-Dimer No results for input(s): DDIMER in the last 72 hours. Hgb A1c No results for input(s): HGBA1C in the last 72 hours. Lipid Profile No results for  input(s): CHOL, HDL, LDLCALC, TRIG, CHOLHDL, LDLDIRECT in the last 72 hours. Thyroid  function studies No results for input(s): TSH, T4TOTAL, T3FREE, THYROIDAB in the last 72 hours.  Invalid input(s): FREET3 Anemia work up No results for input(s): VITAMINB12, FOLATE, FERRITIN, TIBC, IRON, RETICCTPCT in the last 72 hours. Urinalysis    Component Value Date/Time   COLORURINE YELLOW 04/27/2024 1249   APPEARANCEUR CLEAR 04/27/2024 1249   LABSPEC 1.015 04/27/2024 1249   PHURINE 7.0 04/27/2024 1249   GLUCOSEU NEGATIVE 04/27/2024 1249   HGBUR TRACE (A) 04/27/2024 1249   HGBUR large 11/02/2010 0943   BILIRUBINUR NEGATIVE 04/27/2024 1249   BILIRUBINUR neg 05/03/2023 1504   KETONESUR NEGATIVE 04/27/2024 1249   PROTEINUR NEGATIVE 04/27/2024 1249   UROBILINOGEN 0.2 05/03/2023 1504   UROBILINOGEN 0.2 11/02/2010 0943   NITRITE NEGATIVE 04/27/2024 1249   LEUKOCYTESUR NEGATIVE 04/27/2024 1249   Sepsis Labs Recent Labs  Lab 05/16/24 1440 05/17/24 1245 05/20/24 0816  WBC 13.2* 13.8* 9.3   Microbiology Recent Results (from the past 240 hours)  Surgical pcr screen     Status: None   Collection Time: 05/17/24  5:53 AM   Specimen: Nasal Mucosa; Nasal Swab  Result Value Ref Range Status   MRSA, PCR NEGATIVE NEGATIVE Final   Staphylococcus aureus NEGATIVE NEGATIVE Final    Comment: (NOTE) The Xpert SA Assay (FDA approved for NASAL specimens in patients 42 years of age and older), is one component of a comprehensive surveillance program. It is not intended to diagnose infection nor to guide or monitor treatment. Performed at [facility], [address] [place], [place] [address]      Time coordinating discharge: 35 minutes  SIGNED:   Renato Applebaum, MD  [facility] 05/22/2024, 10:34 AM

## 2024-05-22 NOTE — ED Triage Notes (Signed)
 Pt arrives w/ PTAR back from [facility] facility. Was d/c from floor due to fall w/ fracture. [facility] wouldn't take pt due to her arriving after their hours. Should be able to go back home in morning.

## 2024-05-22 NOTE — TOC Transition Note (Signed)
 Transition of Care [facility]) - Discharge Note   Patient Details  Name: [name] [name] MRN: [id number] Date of Birth: 08-28-1927  Transition [facility]) CM/SW Contact:  Almarie CHRISTELLA Goodie, LCSW Phone Number: 05/22/2024, 11:45 AM   Clinical Narrative:   CSW notified by MD that patient is stable for discharge today. CSW sent discharge information, sent to [facility] and confirmed receipt. CSW met with daughter, Channing, at bedside to discuss discharge and answer questions. Transport arranged with [facility] for 3 pm.  Nurse to call report to [phone], Room 205.    Final next level of care: Skilled Nursing Facility Barriers to Discharge: Barriers Resolved   Patient Goals and CMS Choice            Discharge Placement              Patient chooses bed at: [facility], [place] Patient to be transferred to facility by: [facility] Name of family member notified: Channing Patient and family notified of of transfer: 05/22/24  Discharge Plan and Services Additional resources added to the After Visit Summary for                                       Social Drivers of Health (SDOH) Interventions SDOH Screenings   Food Insecurity: No Food Insecurity (05/16/2024)  Housing: Low Risk  (05/16/2024)  Transportation Needs: No Transportation Needs (05/16/2024)  Utilities: Not At Risk (05/16/2024)  Alcohol Screen: Low Risk  (03/16/2022)  Depression (PHQ2-9): Medium Risk (01/01/2023)  Financial Resource Strain: Low Risk  (03/16/2022)  Physical Activity: Insufficiently Active (03/16/2022)  Social Connections: Socially Isolated (05/16/2024)  Stress: Stress Concern Present (03/16/2022)  Tobacco Use: Low Risk  (05/19/2024)     Readmission Risk Interventions     No data to display

## 2024-05-22 NOTE — ED Provider Notes (Signed)
 This patient is seen today because the nursing facility would not accept her back after 9:00 PM, she was being discharged from the hospital but because there was nowhere to bring the patient they brought her to the ER.  She will be excepted for [facility] in the morning.  She has no medical complaints   [name] Rogue, MD 05/22/24 2154

## 2024-05-22 NOTE — ED Notes (Signed)
 Patient's family:  Darice Breen- [phone] Channing Brew- [phone]  They are requesting a phone call when patient is going to be going to [facility] in the AM.

## 2024-05-22 NOTE — Plan of Care (Signed)

## 2024-05-23 DIAGNOSIS — F419 Anxiety disorder, unspecified: Secondary | ICD-10-CM | POA: Diagnosis not present

## 2024-05-23 DIAGNOSIS — L89620 Pressure ulcer of left heel, unstageable: Secondary | ICD-10-CM | POA: Diagnosis not present

## 2024-05-23 DIAGNOSIS — M25551 Pain in right hip: Secondary | ICD-10-CM | POA: Diagnosis not present

## 2024-05-23 DIAGNOSIS — Z7401 Bed confinement status: Secondary | ICD-10-CM | POA: Diagnosis not present

## 2024-05-23 DIAGNOSIS — F03B2 Unspecified dementia, moderate, with psychotic disturbance: Secondary | ICD-10-CM | POA: Diagnosis not present

## 2024-05-23 DIAGNOSIS — R404 Transient alteration of awareness: Secondary | ICD-10-CM | POA: Diagnosis not present

## 2024-05-23 DIAGNOSIS — E039 Hypothyroidism, unspecified: Secondary | ICD-10-CM | POA: Diagnosis not present

## 2024-05-23 DIAGNOSIS — I10 Essential (primary) hypertension: Secondary | ICD-10-CM | POA: Diagnosis not present

## 2024-05-23 DIAGNOSIS — W19XXXD Unspecified fall, subsequent encounter: Secondary | ICD-10-CM | POA: Diagnosis not present

## 2024-05-23 DIAGNOSIS — F5102 Adjustment insomnia: Secondary | ICD-10-CM | POA: Diagnosis not present

## 2024-05-23 DIAGNOSIS — F03A11 Unspecified dementia, mild, with agitation: Secondary | ICD-10-CM | POA: Diagnosis not present

## 2024-05-23 DIAGNOSIS — K5900 Constipation, unspecified: Secondary | ICD-10-CM | POA: Diagnosis not present

## 2024-05-23 DIAGNOSIS — S72141A Displaced intertrochanteric fracture of right femur, initial encounter for closed fracture: Secondary | ICD-10-CM | POA: Diagnosis not present

## 2024-05-23 DIAGNOSIS — Z4789 Encounter for other orthopedic aftercare: Secondary | ICD-10-CM | POA: Diagnosis not present

## 2024-05-23 DIAGNOSIS — S72141D Displaced intertrochanteric fracture of right femur, subsequent encounter for closed fracture with routine healing: Secondary | ICD-10-CM | POA: Diagnosis not present

## 2024-05-23 DIAGNOSIS — R443 Hallucinations, unspecified: Secondary | ICD-10-CM | POA: Diagnosis not present

## 2024-05-23 NOTE — ED Provider Notes (Signed)
  Patient was discharged from the hospital yesterday.  The bed at the facility where she arrived was not ready.  Patient was sent back to the emergency department to hold until her bed was repaired at the skilled nursing facility.  Patient denies any complaints this morning.  Patient has past medical history of dementia.  Patient was hospitalized due to right hip fracture. Physical exam vital signs are stable, patient is awake and alert, normal respirations, moving all extremities.  Patient to be discharged to nursing facility.   Flint Sonny POUR, PA-C 05/23/24 9150    Doretha Folks, MD 05/24/24 [phone]

## 2024-05-23 NOTE — ED Notes (Signed)
 [facility] transported patient via stretcher to [facility].

## 2024-05-23 NOTE — Progress Notes (Addendum)
 9:40am: CSW spoke with patient's daughter Darice to inform her of discharge plan - Darice states understanding and agreement to plan.  CSW spoke with [facility] who states patient is second on the list for pick up - RN aware of information.  CSW spoke with patient's daughter Channing to inform her of discharge plan. Channing states her frustration with the way patient was treated last night by being brought back to the hospital from [facility]. CSW expressed understand and empathy towards the situation. Channing aware that patient is second on the list for transportation to [facility].  8:48am: CSW spoke with Dana at [facility] who confirms patient can return to the facility. Lonell states she has received report from RN and facility is ready for patient.  Niels Portugal, MSW, LCSW Transitions of Care  Clinical Social Worker II [phone]

## 2024-05-23 NOTE — ED Notes (Signed)
 [facility] called for transport to [facility].

## 2024-05-24 DIAGNOSIS — S72141A Displaced intertrochanteric fracture of right femur, initial encounter for closed fracture: Secondary | ICD-10-CM | POA: Diagnosis not present

## 2024-05-28 DIAGNOSIS — S72141D Displaced intertrochanteric fracture of right femur, subsequent encounter for closed fracture with routine healing: Secondary | ICD-10-CM | POA: Diagnosis not present

## 2024-05-30 DIAGNOSIS — L89620 Pressure ulcer of left heel, unstageable: Secondary | ICD-10-CM | POA: Diagnosis not present

## 2024-06-03 DIAGNOSIS — L89620 Pressure ulcer of left heel, unstageable: Secondary | ICD-10-CM | POA: Diagnosis not present

## 2024-06-15 DIAGNOSIS — F03B2 Unspecified dementia, moderate, with psychotic disturbance: Secondary | ICD-10-CM | POA: Diagnosis not present

## 2024-06-15 DIAGNOSIS — F5102 Adjustment insomnia: Secondary | ICD-10-CM | POA: Diagnosis not present

## 2024-06-25 ENCOUNTER — Ambulatory Visit: Admitting: Neurology

## 2024-06-30 DIAGNOSIS — S72141D Displaced intertrochanteric fracture of right femur, subsequent encounter for closed fracture with routine healing: Secondary | ICD-10-CM | POA: Diagnosis not present

## 2024-07-02 DIAGNOSIS — L89602 Pressure ulcer of unspecified heel, stage 2: Secondary | ICD-10-CM | POA: Diagnosis not present

## 2024-07-02 DIAGNOSIS — J449 Chronic obstructive pulmonary disease, unspecified: Secondary | ICD-10-CM | POA: Diagnosis not present

## 2024-07-02 DIAGNOSIS — I10 Essential (primary) hypertension: Secondary | ICD-10-CM | POA: Diagnosis not present

## 2024-07-02 DIAGNOSIS — E038 Other specified hypothyroidism: Secondary | ICD-10-CM | POA: Diagnosis not present

## 2024-07-02 DIAGNOSIS — R41841 Cognitive communication deficit: Secondary | ICD-10-CM | POA: Diagnosis not present

## 2024-07-07 ENCOUNTER — Ambulatory Visit: Payer: Medicare Other | Admitting: Podiatry

## 2024-07-25 DIAGNOSIS — J449 Chronic obstructive pulmonary disease, unspecified: Secondary | ICD-10-CM | POA: Diagnosis not present

## 2024-07-25 DIAGNOSIS — R41841 Cognitive communication deficit: Secondary | ICD-10-CM | POA: Diagnosis not present

## 2024-07-25 DIAGNOSIS — I10 Essential (primary) hypertension: Secondary | ICD-10-CM | POA: Diagnosis not present

## 2024-07-25 DIAGNOSIS — E559 Vitamin D deficiency, unspecified: Secondary | ICD-10-CM | POA: Diagnosis not present

## 2024-07-27 DIAGNOSIS — F419 Anxiety disorder, unspecified: Secondary | ICD-10-CM | POA: Diagnosis not present

## 2024-07-27 DIAGNOSIS — I10 Essential (primary) hypertension: Secondary | ICD-10-CM | POA: Diagnosis not present

## 2024-07-27 DIAGNOSIS — E559 Vitamin D deficiency, unspecified: Secondary | ICD-10-CM | POA: Diagnosis not present

## 2024-07-27 DIAGNOSIS — J449 Chronic obstructive pulmonary disease, unspecified: Secondary | ICD-10-CM | POA: Diagnosis not present

## 2024-07-30 DIAGNOSIS — J449 Chronic obstructive pulmonary disease, unspecified: Secondary | ICD-10-CM | POA: Diagnosis not present

## 2024-07-30 DIAGNOSIS — I10 Essential (primary) hypertension: Secondary | ICD-10-CM | POA: Diagnosis not present

## 2024-08-20 DIAGNOSIS — B351 Tinea unguium: Secondary | ICD-10-CM | POA: Diagnosis not present

## 2024-08-20 DIAGNOSIS — R238 Other skin changes: Secondary | ICD-10-CM | POA: Diagnosis not present

## 2024-08-20 DIAGNOSIS — R262 Difficulty in walking, not elsewhere classified: Secondary | ICD-10-CM | POA: Diagnosis not present

## 2024-08-20 DIAGNOSIS — F03911 Unspecified dementia, unspecified severity, with agitation: Secondary | ICD-10-CM | POA: Diagnosis not present

## 2024-08-20 DIAGNOSIS — I872 Venous insufficiency (chronic) (peripheral): Secondary | ICD-10-CM | POA: Diagnosis not present

## 2024-08-20 DIAGNOSIS — N390 Urinary tract infection, site not specified: Secondary | ICD-10-CM | POA: Diagnosis not present

## 2024-08-20 DIAGNOSIS — M6281 Muscle weakness (generalized): Secondary | ICD-10-CM | POA: Diagnosis not present

## 2024-09-09 ENCOUNTER — Ambulatory Visit: Admitting: Podiatry

## 2024-09-25 DIAGNOSIS — N390 Urinary tract infection, site not specified: Secondary | ICD-10-CM | POA: Diagnosis not present

## 2024-10-05 ENCOUNTER — Telehealth: Payer: Self-pay | Admitting: Family Medicine

## 2024-10-05 NOTE — Telephone Encounter (Signed)
 [name] [name]- can you try calling Pt please? She is overdue for appt.

## 2024-10-05 NOTE — Telephone Encounter (Signed)
 Called Pts daughter to schedule a med f/u office visit, she stated that her mother is in a memory facility and could not come to an appointment. She wants to know if it is really necessary for her to have this appointment. She wants to be called back with information on what to do next.

## 2024-10-05 NOTE — Telephone Encounter (Signed)
 unable to schedule

## 2024-10-06 ENCOUNTER — Telehealth: Payer: Self-pay

## 2024-10-06 DIAGNOSIS — N390 Urinary tract infection, site not specified: Secondary | ICD-10-CM | POA: Diagnosis not present

## 2024-10-06 NOTE — Telephone Encounter (Signed)
 Copied from CRM [id number]. Topic: Clinical - Prescription Issue >> Oct 06, 2024 11:22 AM Mesmerise C wrote: Reason for CRM: Arvella for [employer] [employer] they only have accord brand for levothyroxine  (SYNTHROID ) 125 MCG tablet wants to make sure it's okay to proceed in filling the medication can be called back at [phone]

## 2024-10-07 NOTE — Telephone Encounter (Signed)
 Spoke with the pts daughter and I advised of the below recommendation and I advised that will need to speak to the memory care facility if they're able to draw labs or have a provider see the patient

## 2024-10-22 DIAGNOSIS — M6281 Muscle weakness (generalized): Secondary | ICD-10-CM | POA: Diagnosis not present

## 2024-10-22 DIAGNOSIS — F03911 Unspecified dementia, unspecified severity, with agitation: Secondary | ICD-10-CM | POA: Diagnosis not present

## 2024-10-22 DIAGNOSIS — R238 Other skin changes: Secondary | ICD-10-CM | POA: Diagnosis not present

## 2024-10-22 DIAGNOSIS — I872 Venous insufficiency (chronic) (peripheral): Secondary | ICD-10-CM | POA: Diagnosis not present

## 2024-10-22 DIAGNOSIS — B351 Tinea unguium: Secondary | ICD-10-CM | POA: Diagnosis not present

## 2024-10-22 DIAGNOSIS — R262 Difficulty in walking, not elsewhere classified: Secondary | ICD-10-CM | POA: Diagnosis not present

## 2024-11-16 ENCOUNTER — Telehealth: Payer: Self-pay | Admitting: Diagnostic Neuroimaging

## 2024-11-16 NOTE — Telephone Encounter (Signed)
 Pt's daughter said appointment is not needed

## 2024-11-24 ENCOUNTER — Ambulatory Visit: Admitting: Diagnostic Neuroimaging

## 2024-12-08 ENCOUNTER — Other Ambulatory Visit: Payer: Self-pay | Admitting: Family Medicine
